# Patient Record
Sex: Female | Born: 1985 | Race: Black or African American | Hispanic: No | Marital: Single | Smoking: Current every day smoker
Health system: Southern US, Community
[De-identification: ages and names within clinical notes are randomized; demographics above are authoritative.]

## PROBLEM LIST (undated history)

## (undated) ENCOUNTER — Ambulatory Visit (HOSPITAL_COMMUNITY): Admission: EM | Payer: BC Managed Care – PPO | Source: Home / Self Care

## (undated) ENCOUNTER — Emergency Department (HOSPITAL_COMMUNITY): Admission: EM | Payer: BC Managed Care – PPO | Source: Home / Self Care

## (undated) DIAGNOSIS — Z789 Other specified health status: Secondary | ICD-10-CM

## (undated) HISTORY — DX: Other specified health status: Z78.9

## (undated) HISTORY — PX: WISDOM TOOTH EXTRACTION: SHX21

---
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
## 2003-11-26 ENCOUNTER — Emergency Department (HOSPITAL_COMMUNITY): Admission: EM | Admit: 2003-11-26 | Discharge: 2003-11-26 | Payer: Self-pay | Admitting: Emergency Medicine

## 2004-10-28 ENCOUNTER — Emergency Department (HOSPITAL_COMMUNITY): Admission: EM | Admit: 2004-10-28 | Discharge: 2004-10-28 | Payer: Self-pay | Admitting: *Deleted

## 2004-12-10 ENCOUNTER — Inpatient Hospital Stay (HOSPITAL_COMMUNITY): Admission: AD | Admit: 2004-12-10 | Discharge: 2004-12-10 | Payer: Self-pay | Admitting: *Deleted

## 2005-04-08 ENCOUNTER — Emergency Department (HOSPITAL_COMMUNITY): Admission: EM | Admit: 2005-04-08 | Discharge: 2005-04-08 | Payer: Self-pay | Admitting: Family Medicine

## 2006-09-12 ENCOUNTER — Emergency Department (HOSPITAL_COMMUNITY): Admission: EM | Admit: 2006-09-12 | Discharge: 2006-09-12 | Payer: Self-pay | Admitting: Emergency Medicine

## 2007-06-18 ENCOUNTER — Emergency Department (HOSPITAL_COMMUNITY): Admission: EM | Admit: 2007-06-18 | Discharge: 2007-06-18 | Payer: Self-pay | Admitting: Emergency Medicine

## 2007-09-23 ENCOUNTER — Emergency Department (HOSPITAL_COMMUNITY): Admission: EM | Admit: 2007-09-23 | Discharge: 2007-09-23 | Payer: Self-pay | Admitting: Emergency Medicine

## 2008-01-26 ENCOUNTER — Emergency Department (HOSPITAL_COMMUNITY): Admission: EM | Admit: 2008-01-26 | Discharge: 2008-01-26 | Payer: Self-pay | Admitting: Family Medicine

## 2008-01-29 ENCOUNTER — Emergency Department (HOSPITAL_COMMUNITY): Admission: EM | Admit: 2008-01-29 | Discharge: 2008-01-29 | Payer: Self-pay | Admitting: Emergency Medicine

## 2008-02-18 ENCOUNTER — Emergency Department (HOSPITAL_COMMUNITY): Admission: EM | Admit: 2008-02-18 | Discharge: 2008-02-19 | Payer: Self-pay | Admitting: Emergency Medicine

## 2008-05-22 ENCOUNTER — Emergency Department (HOSPITAL_COMMUNITY): Admission: EM | Admit: 2008-05-22 | Discharge: 2008-05-22 | Payer: Self-pay | Admitting: Emergency Medicine

## 2009-03-14 ENCOUNTER — Emergency Department (HOSPITAL_COMMUNITY): Admission: EM | Admit: 2009-03-14 | Discharge: 2009-03-15 | Payer: Self-pay | Admitting: Emergency Medicine

## 2009-05-15 ENCOUNTER — Emergency Department (HOSPITAL_COMMUNITY): Admission: EM | Admit: 2009-05-15 | Discharge: 2009-05-15 | Payer: Self-pay | Admitting: Emergency Medicine

## 2010-03-27 LAB — URINALYSIS, ROUTINE W REFLEX MICROSCOPIC
Bilirubin Urine: NEGATIVE
Glucose, UA: NEGATIVE mg/dL
Hgb urine dipstick: NEGATIVE
Ketones, ur: NEGATIVE mg/dL
Nitrite: NEGATIVE
Protein, ur: NEGATIVE mg/dL
Specific Gravity, Urine: 1.026 (ref 1.005–1.030)
Urobilinogen, UA: 4 mg/dL — ABNORMAL HIGH (ref 0.0–1.0)
pH: 7.5 (ref 5.0–8.0)

## 2010-03-27 LAB — CBC
HCT: 36.1 % (ref 36.0–46.0)
Hemoglobin: 12.7 g/dL (ref 12.0–15.0)
MCHC: 35.1 g/dL (ref 30.0–36.0)
MCV: 95.6 fL (ref 78.0–100.0)
Platelets: 159 10*3/uL (ref 150–400)
RBC: 3.77 MIL/uL — ABNORMAL LOW (ref 3.87–5.11)
RDW: 13.1 % (ref 11.5–15.5)
WBC: 8.1 10*3/uL (ref 4.0–10.5)

## 2010-03-27 LAB — COMPREHENSIVE METABOLIC PANEL
ALT: 13 U/L (ref 0–35)
AST: 17 U/L (ref 0–37)
Albumin: 4.1 g/dL (ref 3.5–5.2)
Calcium: 9.3 mg/dL (ref 8.4–10.5)
Chloride: 109 mEq/L (ref 96–112)
Creatinine, Ser: 0.87 mg/dL (ref 0.4–1.2)
GFR calc Af Amer: >60 mL/min (ref >60)
Sodium: 140 mEq/L (ref 135–145)
Total Bilirubin: 1 mg/dL (ref 0.3–1.2)

## 2010-03-27 LAB — DIFFERENTIAL
Basophils Absolute: 0 10*3/uL (ref 0.0–0.1)
Basophils Relative: 0 % (ref 0–1)
Eosinophils Absolute: 0.1 10*3/uL (ref 0.0–0.7)
Eosinophils Relative: 1 % (ref 0–5)
Lymphocytes Relative: 26 % (ref 12–46)
Lymphs Abs: 2.1 10*3/uL (ref 0.7–4.0)
Monocytes Absolute: 0.7 10*3/uL (ref 0.1–1.0)
Monocytes Relative: 8 % (ref 3–12)
Neutro Abs: 5.3 10*3/uL (ref 1.7–7.7)
Neutrophils Relative %: 65 % (ref 43–77)

## 2010-03-27 LAB — POCT PREGNANCY, URINE: Preg Test, Ur: NEGATIVE

## 2010-04-01 LAB — DIFFERENTIAL
Basophils Relative: 0 % (ref 0–1)
Lymphocytes Relative: 25 % (ref 12–46)
Lymphs Abs: 1.1 10*3/uL (ref 0.7–4.0)
Monocytes Relative: 12 % (ref 3–12)
Neutro Abs: 2.7 10*3/uL (ref 1.7–7.7)
Neutrophils Relative %: 63 % (ref 43–77)

## 2010-04-01 LAB — WET PREP, GENITAL: Trich, Wet Prep: NONE SEEN

## 2010-04-01 LAB — URINALYSIS, ROUTINE W REFLEX MICROSCOPIC
Specific Gravity, Urine: 1.029 (ref 1.005–1.030)
Urobilinogen, UA: 2 mg/dL — ABNORMAL HIGH (ref 0.0–1.0)

## 2010-04-01 LAB — COMPREHENSIVE METABOLIC PANEL
BUN: 4 mg/dL — ABNORMAL LOW (ref 6–23)
Calcium: 8.2 mg/dL — ABNORMAL LOW (ref 8.4–10.5)
Creatinine, Ser: 0.9 mg/dL (ref 0.4–1.2)
Glucose, Bld: 84 mg/dL (ref 70–99)
Total Protein: 5.8 g/dL — ABNORMAL LOW (ref 6.0–8.3)

## 2010-04-01 LAB — CBC
HCT: 35.2 % — ABNORMAL LOW (ref 36.0–46.0)
Hemoglobin: 11.8 g/dL — ABNORMAL LOW (ref 12.0–15.0)
MCHC: 33.5 g/dL (ref 30.0–36.0)
RDW: 13 % (ref 11.5–15.5)

## 2010-04-01 LAB — URINE MICROSCOPIC-ADD ON

## 2010-04-17 LAB — URINALYSIS, ROUTINE W REFLEX MICROSCOPIC
Bilirubin Urine: NEGATIVE
Ketones, ur: NEGATIVE mg/dL
Nitrite: NEGATIVE
Protein, ur: NEGATIVE mg/dL
Specific Gravity, Urine: 1.029 (ref 1.005–1.030)
Urobilinogen, UA: 1 mg/dL (ref 0.0–1.0)

## 2010-04-17 LAB — CBC
HCT: 39.5 % (ref 36.0–46.0)
Hemoglobin: 13.6 g/dL (ref 12.0–15.0)
MCHC: 34.3 g/dL (ref 30.0–36.0)
MCV: 95.6 fL (ref 78.0–100.0)
Platelets: 153 K/uL (ref 150–400)
RBC: 4.13 MIL/uL (ref 3.87–5.11)
RDW: 13.3 % (ref 11.5–15.5)
WBC: 7.9 K/uL (ref 4.0–10.5)

## 2010-04-17 LAB — POCT PREGNANCY, URINE: Preg Test, Ur: NEGATIVE

## 2010-04-17 LAB — URINE MICROSCOPIC-ADD ON

## 2010-04-17 LAB — DIFFERENTIAL
Basophils Absolute: 0 10*3/uL (ref 0.0–0.1)
Eosinophils Absolute: 0.1 10*3/uL (ref 0.0–0.7)
Eosinophils Relative: 1 % (ref 0–5)
Monocytes Absolute: 0.7 10*3/uL (ref 0.1–1.0)

## 2010-04-17 LAB — POCT I-STAT, CHEM 8
Calcium, Ion: 1.19 mmol/L (ref 1.12–1.32)
Creatinine, Ser: 1.1 mg/dL (ref 0.4–1.2)
Hemoglobin: 13.9 g/dL (ref 12.0–15.0)
Sodium: 141 mEq/L (ref 135–145)
TCO2: 24 mmol/L (ref 0–100)

## 2010-04-17 LAB — WET PREP, GENITAL
Trich, Wet Prep: NONE SEEN
Yeast Wet Prep HPF POC: NONE SEEN

## 2010-04-17 LAB — URINE CULTURE: Colony Count: 50000

## 2010-04-17 LAB — GC/CHLAMYDIA PROBE AMP, GENITAL
Chlamydia, DNA Probe: NEGATIVE
GC Probe Amp, Genital: NEGATIVE

## 2010-04-23 LAB — DIFFERENTIAL
Basophils Relative: 0 % (ref 0–1)
Eosinophils Absolute: 0 10*3/uL (ref 0.0–0.7)
Eosinophils Absolute: 0 10*3/uL (ref 0.0–0.7)
Eosinophils Relative: 0 % (ref 0–5)
Eosinophils Relative: 0 % (ref 0–5)
Lymphocytes Relative: 6 % — ABNORMAL LOW (ref 12–46)
Lymphs Abs: 0.5 10*3/uL — ABNORMAL LOW (ref 0.7–4.0)
Lymphs Abs: 2.4 10*3/uL (ref 0.7–4.0)
Monocytes Absolute: 0.5 10*3/uL (ref 0.1–1.0)
Monocytes Relative: 4 % (ref 3–12)
Monocytes Relative: 6 % (ref 3–12)
Neutrophils Relative %: 65 % (ref 43–77)

## 2010-04-23 LAB — URINALYSIS, ROUTINE W REFLEX MICROSCOPIC
Bilirubin Urine: NEGATIVE
Ketones, ur: 40 mg/dL — AB
Nitrite: NEGATIVE
Specific Gravity, Urine: 1.027 (ref 1.005–1.030)
Urobilinogen, UA: 0.2 mg/dL (ref 0.0–1.0)
pH: 5.5 (ref 5.0–8.0)

## 2010-04-23 LAB — CBC
HCT: 43.3 % (ref 36.0–46.0)
HCT: 44.8 % (ref 36.0–46.0)
Hemoglobin: 14.4 g/dL (ref 12.0–15.0)
Hemoglobin: 15 g/dL (ref 12.0–15.0)
MCHC: 33.3 g/dL (ref 30.0–36.0)
MCV: 97.5 fL (ref 78.0–100.0)
MCV: 98.8 fL (ref 78.0–100.0)
RBC: 4.38 MIL/uL (ref 3.87–5.11)
RBC: 4.59 MIL/uL (ref 3.87–5.11)
WBC: 8.4 10*3/uL (ref 4.0–10.5)
WBC: 8.8 10*3/uL (ref 4.0–10.5)

## 2010-04-23 LAB — POCT URINALYSIS DIP (DEVICE)
Glucose, UA: NEGATIVE mg/dL
Ketones, ur: 40 mg/dL — AB
Specific Gravity, Urine: >=1.03 (ref 1.005–1.030)
Urobilinogen, UA: 0.2 mg/dL (ref 0.0–1.0)

## 2010-04-23 LAB — POCT I-STAT, CHEM 8
Chloride: 107 mEq/L (ref 96–112)
Glucose, Bld: 80 mg/dL (ref 70–99)
HCT: 46 % (ref 36.0–46.0)
Hemoglobin: 15.6 g/dL — ABNORMAL HIGH (ref 12.0–15.0)
Potassium: 3.8 mEq/L (ref 3.5–5.1)
Sodium: 143 mEq/L (ref 135–145)

## 2010-04-23 LAB — BASIC METABOLIC PANEL
Chloride: 107 mEq/L (ref 96–112)
GFR calc Af Amer: >60 mL/min (ref >60)
GFR calc non Af Amer: >60 mL/min (ref >60)
Potassium: 3.7 mEq/L (ref 3.5–5.1)
Sodium: 137 mEq/L (ref 135–145)

## 2010-04-23 LAB — URINE MICROSCOPIC-ADD ON

## 2010-04-23 LAB — HEPATIC FUNCTION PANEL
ALT: 23 U/L (ref 0–35)
Alkaline Phosphatase: 52 U/L (ref 39–117)
Indirect Bilirubin: 1 mg/dL — ABNORMAL HIGH (ref 0.3–0.9)
Total Bilirubin: 1.3 mg/dL — ABNORMAL HIGH (ref 0.3–1.2)
Total Protein: 8 g/dL (ref 6.0–8.3)

## 2010-04-23 LAB — POCT PREGNANCY, URINE: Preg Test, Ur: NEGATIVE

## 2010-04-23 LAB — LIPASE, BLOOD: Lipase: 25 U/L (ref 11–59)

## 2010-04-24 LAB — URINALYSIS, ROUTINE W REFLEX MICROSCOPIC
Bilirubin Urine: NEGATIVE
Ketones, ur: 15 mg/dL — AB
Nitrite: NEGATIVE
Protein, ur: NEGATIVE mg/dL
Urobilinogen, UA: 1 mg/dL (ref 0.0–1.0)

## 2010-04-24 LAB — POCT I-STAT, CHEM 8
Calcium, Ion: 1.24 mmol/L (ref 1.12–1.32)
Creatinine, Ser: 1.1 mg/dL (ref 0.4–1.2)
Glucose, Bld: 64 mg/dL — ABNORMAL LOW (ref 70–99)
HCT: 42 % (ref 36.0–46.0)
Hemoglobin: 14.3 g/dL (ref 12.0–15.0)
TCO2: 25 mmol/L (ref 0–100)

## 2010-04-24 LAB — COMPREHENSIVE METABOLIC PANEL
ALT: 15 U/L (ref 0–35)
Albumin: 4.4 g/dL (ref 3.5–5.2)
Calcium: 9.4 mg/dL (ref 8.4–10.5)
GFR calc Af Amer: >60 mL/min (ref >60)
Glucose, Bld: 57 mg/dL — ABNORMAL LOW (ref 70–99)
Sodium: 138 mEq/L (ref 135–145)
Total Protein: 7.1 g/dL (ref 6.0–8.3)

## 2010-04-24 LAB — CBC
Hemoglobin: 13.6 g/dL (ref 12.0–15.0)
MCHC: 34.6 g/dL (ref 30.0–36.0)
Platelets: 162 10*3/uL (ref 150–400)
RDW: 12.4 % (ref 11.5–15.5)

## 2010-04-24 LAB — DIFFERENTIAL
Eosinophils Absolute: 0.1 10*3/uL (ref 0.0–0.7)
Lymphs Abs: 3.6 10*3/uL (ref 0.7–4.0)
Monocytes Absolute: 0.6 10*3/uL (ref 0.1–1.0)
Monocytes Relative: 7 % (ref 3–12)
Neutrophils Relative %: 50 % (ref 43–77)

## 2010-05-25 NOTE — Op Note (Signed)
[facility]  Patient:    Dominique Webb, Dominique Webb Visit Number: [id number] MRN: [id number]          Service Type: OUT Location: RAD Attending Physician:  Christa See Dictated by:   Elpidio Anis, M.D. Proc. Date: 08/27/00 Admit Date:  10/07/2000 Discharge Date: 10/07/2000                             Operative Report  DISCHARGE DIAGNOSIS:  Acute gastroenteritis.  DISPOSITION:  Patient discharged home in stable satisfactory condition.  DISCHARGE MEDICATIONS: 1. Phenergan 25 mg q.4h. p.r.n. nausea. 2. Pepcid 20 mg b.i.d. 3. Histussin HC 2 tsp q.4h. p.r.n. cough.  FOLLOWUP:  She is scheduled to be seen in the office on a p.r.n. basis.  BRIEF HISTORY:  A 25 year old female with a history of cough, congestion, recurrent nausea and vomiting without diarrhea.  She had epigastric pain with emesis of green bilious material and brown liquid on the afternoon of admission.  She had not been able to keep down any liquids or solids for greater than 24 hours.  She was admitted with the diagnosis of acute gastroenteritis.  PAST HISTORY:  Unremarkable.  PHYSICAL EXAMINATION:  GENERAL:  She was afebrile.  VITAL SIGNS:  Blood pressure 98/72, pulse 76, respirations 18.  HEENT:  Was unremarkable except for minimal clear nasal exudate.  LUNGS:  Clear.  ABDOMEN:  Benign.  LABORATORY DATA:  White count 5,300, hemoglobin 11.6, hematocrit 33.  Sed rate 4.  BUN 8, creatinine 0.9.  Serum pregnancy test was negative.  HOSPITAL COURSE:  The patient was admitted and started on IV fluids and given antiemetics and monitored closely.  She did very well.  Her cough was treated with Histussin HC antitussive medication.  After 24-hours she was tolerating a liquid and her diet was advanced.  She remained stable.  Her abdomen was benign.  By the evening of August 28, 2000, she was much improved and was tolerating diet well.  Her diet was advanced to a regular diet.  She  remained stable and afebrile.  She tolerated her diet without difficulty and was discharged home on the second hospital day in satisfactory condition. Dictated by:   Elpidio Anis, M.D. Attending Physician:  Christa See DD:  10/25/00 TD:  10/27/00 Job: [id number]

## 2010-10-04 LAB — CBC
Hemoglobin: 12.7
MCHC: 34.5
MCV: 96.2
RBC: 3.82 — ABNORMAL LOW

## 2010-10-04 LAB — DIFFERENTIAL
Basophils Relative: 0
Eosinophils Absolute: 0.1
Monocytes Absolute: 0.7
Monocytes Relative: 8
Neutrophils Relative %: 44

## 2010-10-04 LAB — URINALYSIS, ROUTINE W REFLEX MICROSCOPIC
Bilirubin Urine: NEGATIVE
Protein, ur: NEGATIVE
Urobilinogen, UA: 1

## 2010-10-04 LAB — POCT PREGNANCY, URINE
Operator id: 257131
Preg Test, Ur: NEGATIVE

## 2010-10-04 LAB — URINE MICROSCOPIC-ADD ON

## 2010-10-08 LAB — COMPREHENSIVE METABOLIC PANEL
BUN: 5 — ABNORMAL LOW
CO2: 24
Calcium: 9
Creatinine, Ser: 0.94
GFR calc non Af Amer: >60
Glucose, Bld: 89
Sodium: 138
Total Protein: 6

## 2010-10-08 LAB — WET PREP, GENITAL

## 2010-10-08 LAB — URINE CULTURE

## 2010-10-08 LAB — URINALYSIS, ROUTINE W REFLEX MICROSCOPIC
Protein, ur: NEGATIVE
Urobilinogen, UA: 1

## 2010-10-08 LAB — CBC
Hemoglobin: 12.8
MCHC: 34.4
MCV: 95.9
RBC: 3.87
RDW: 12.9

## 2010-10-08 LAB — GC/CHLAMYDIA PROBE AMP, GENITAL
Chlamydia, DNA Probe: NEGATIVE
GC Probe Amp, Genital: NEGATIVE

## 2010-10-08 LAB — DIFFERENTIAL
Eosinophils Absolute: 0.1
Lymphocytes Relative: 39
Lymphs Abs: 2.9
Monocytes Relative: 8
Neutro Abs: 3.7
Neutrophils Relative %: 51

## 2010-10-08 LAB — URINE MICROSCOPIC-ADD ON

## 2010-10-08 LAB — RPR: RPR Ser Ql: NONREACTIVE

## 2011-03-23 ENCOUNTER — Encounter (HOSPITAL_COMMUNITY): Payer: Self-pay | Admitting: *Deleted

## 2011-03-23 ENCOUNTER — Emergency Department (HOSPITAL_COMMUNITY)
Admission: EM | Admit: 2011-03-23 | Discharge: 2011-03-23 | Disposition: A | Discharge status: Home / Self Care | Payer: Self-pay | Attending: Emergency Medicine | Admitting: Emergency Medicine

## 2011-03-23 DIAGNOSIS — J069 Acute upper respiratory infection, unspecified: Secondary | ICD-10-CM | POA: Insufficient documentation

## 2011-03-23 DIAGNOSIS — Z886 Allergy status to analgesic agent status: Secondary | ICD-10-CM | POA: Insufficient documentation

## 2011-03-23 DIAGNOSIS — F17200 Nicotine dependence, unspecified, uncomplicated: Secondary | ICD-10-CM | POA: Insufficient documentation

## 2011-03-23 MED ORDER — ALBUTEROL SULFATE HFA 108 (90 BASE) MCG/ACT IN AERS
2.0000 | INHALATION_SPRAY | Freq: Once | RESPIRATORY_TRACT | Status: AC
  Administered 2011-03-23: 2 via RESPIRATORY_TRACT
  Filled 2011-03-23: qty 6.7

## 2011-03-23 NOTE — ED Notes (Signed)
MD at bedside. 

## 2011-03-23 NOTE — ED Provider Notes (Signed)
History     CSN: [id number]  Arrival date & time 03/23/11  Dominique Webb   First MD Initiated Contact with Patient 03/23/11 1910      Chief Complaint  Patient presents with  . Nasal Congestion    (Consider location/radiation/quality/duration/timing/severity/associated sxs/prior treatment) HPI History provided by pt.   Pt presents w/ c/o sore throat x 1 week.  Aggravated by swallowing.  Associated w/ chills, nasal congestion and cough productive of yellow sputum.  Has also had 2 episodes of vomiting over the past 24 hours.  Denies sinus/ear pressure, SOB, abd pain, diarrhea and urinary sx.  No known sick contacts.  Pt has no PMH.  She smokes cigarettes.    History reviewed. No pertinent past medical history.  History reviewed. No pertinent past surgical history.  No family history on file.  History  Substance Use Topics  . Smoking status: Current Everyday Smoker -- 0.5 packs/day    Types: Cigarettes  . Smokeless tobacco: Not on file  . Alcohol Use: Yes    OB History    Grav Para Term Preterm Abortions TAB SAB Ect Mult Living   0               Review of Systems  All other systems reviewed and are negative.    Allergies  Aspirin  Home Medications  No current outpatient prescriptions on file.  BP 103/72  Pulse 79  Temp(Src) 98.8 F (37.1 C) (Oral)  Ht 5\' 3"  (1.6 m)  Wt 128 lb (58.06 kg)  BMI 22.67 kg/m2  SpO2 100%  Physical Exam  Nursing note and vitals reviewed. Constitutional: She is oriented to person, place, and time. She appears well-developed and well-nourished. No distress.  HENT:  Head: Normocephalic and atraumatic. No trismus in the jaw.  Right Ear: Tympanic membrane, external ear and ear canal normal.  Left Ear: Tympanic membrane, external ear and ear canal normal.  Mouth/Throat: Uvula is midline and mucous membranes are normal. No oropharyngeal exudate, posterior oropharyngeal edema or posterior oropharyngeal erythema.       Nasal congestion.  No  erythema of posterior pharynx.   Eyes:       Normal appearance  Neck: Normal range of motion. Neck supple.  Cardiovascular: Normal rate and regular rhythm.   Pulmonary/Chest: Effort normal and breath sounds normal.  Lymphadenopathy:    She has no cervical adenopathy.  Neurological: She is alert and oriented to person, place, and time.  Skin: Skin is warm and dry. No rash noted.  Psychiatric: She has a normal mood and affect. Her behavior is normal.    ED Course  Procedures (including critical care time)  Labs Reviewed - No data to display No results found.   1. Viral URI       MDM  Healthy 26yo F presents w/ sore throat, nasal congestion and productive cough x 1 week.  Doubt strep based on centor criteria.  Doubt pneumonia based on VS and presence of other URI sx.  Pt is afebrile and no coughing in ED.  Will treat symptomatically w/ albuterol (recieved inhaler in ED), ibuprofen/tylenol and sudafed.  Return precautions discussed.         Otilio Miu, [place] 03/23/11 2013

## 2011-03-23 NOTE — ED Notes (Signed)
Complaining of nasal and throat congestion x 1 week, coughing up clear phlegm

## 2011-03-23 NOTE — Discharge Instructions (Signed)
Take ibuprofen w/ food up to three times a day, as needed for pain, fever and chills.  If you can not take ibuprofen because of history of stomach bleeding, take tylenol instead. Take sudafed as needed for nasal/sinus congestion.  Use albuterol inhaler, 2 puffs every 4 hours, as needed for cough and shortness of breath.  Get rest, drink plenty of fluids and wash hands often to prevent spread of infection.  You should return to the ER if your cough worsens, you develop shortness of breath or symptoms have not started to improve in 3-4 days.

## 2011-03-24 NOTE — ED Provider Notes (Signed)
Medical screening examination/treatment/procedure(s) were performed by non-physician practitioner and as supervising physician I was immediately available for consultation/collaboration.    Nelia Shi, MD 03/24/11 [phone]

## 2011-05-24 ENCOUNTER — Emergency Department (HOSPITAL_COMMUNITY)
Admission: EM | Admit: 2011-05-24 | Discharge: 2011-05-24 | Disposition: A | Discharge status: Home / Self Care | Payer: Self-pay | Attending: Emergency Medicine | Admitting: Emergency Medicine

## 2011-05-24 DIAGNOSIS — L299 Pruritus, unspecified: Secondary | ICD-10-CM | POA: Insufficient documentation

## 2011-05-24 DIAGNOSIS — L259 Unspecified contact dermatitis, unspecified cause: Secondary | ICD-10-CM | POA: Insufficient documentation

## 2011-05-24 DIAGNOSIS — Z79899 Other long term (current) drug therapy: Secondary | ICD-10-CM | POA: Insufficient documentation

## 2011-05-24 DIAGNOSIS — L309 Dermatitis, unspecified: Secondary | ICD-10-CM

## 2011-05-24 DIAGNOSIS — F17200 Nicotine dependence, unspecified, uncomplicated: Secondary | ICD-10-CM | POA: Insufficient documentation

## 2011-05-24 MED ORDER — HYDROCORTISONE 2.5 % EX CREA: TOPICAL_CREAM | Freq: Two times a day (BID) | CUTANEOUS | Status: DC

## 2011-05-24 NOTE — ED Provider Notes (Signed)
History     CSN: [id number]  Arrival date & time 05/24/11  1021   First MD Initiated Contact with Patient 05/24/11 1049     11:08 AM HPI Patient reports she's had a rash on the Left lateral ankle for 2 months. States it is extremely pruritic. Reports no improvement and appears to be worsening. Reports the area is very dark. Denies redness, drainage, or fever. States she thinks she was bitten by a bug 2 months ago.  Patient is a 26 y.o. female presenting with rash. The history is provided by the patient.  Rash  This is a new problem. Episode onset: 2 months ago. The problem has been gradually worsening. The problem is associated with an unknown factor. There has been no fever. Affected Location: Left ankle. The patient is experiencing no pain. Associated symptoms include itching. Pertinent negatives include no blisters, no pain and no weeping. She has tried nothing for the symptoms.    No past medical history on file.  No past surgical history on file.  No family history on file.  History  Substance Use Topics  . Smoking status: Current Everyday Smoker -- 0.5 packs/day    Types: Cigarettes  . Smokeless tobacco: Not on file  . Alcohol Use: Yes    OB History    Grav Para Term Preterm Abortions TAB SAB Ect Mult Living   0               Review of Systems  Constitutional: Negative for fever and chills.  HENT: Negative for rhinorrhea.   Eyes: Negative for redness.  Respiratory: Negative for cough, shortness of breath and wheezing.   Cardiovascular: Negative for chest pain and palpitations.  Gastrointestinal: Negative for nausea.  Skin: Positive for color change, itching and rash.       Pruritus  All other systems reviewed and are negative.    Allergies  Aspirin  Home Medications   Current Outpatient Rx  Name Route Sig Dispense Refill  . MEDROXYPROGESTERONE ACETATE 150 MG/ML IM SUSP Intramuscular Inject 150 mg into the muscle every 3 (three) months.      BP 103/58   Pulse 53  Temp(Src) 98.5 F (36.9 C) (Oral)  Resp 16  SpO2 100%  Physical Exam  Vitals reviewed. Constitutional: She is oriented to person, place, and time. Vital signs are normal. She appears well-developed and well-nourished. No distress.  HENT:  Head: Normocephalic and atraumatic.  Eyes: Pupils are equal, round, and reactive to light.  Neck: Neck supple.  Pulmonary/Chest: Effort normal.  Neurological: She is alert and oriented to person, place, and time.  Skin: Skin is warm and dry. Rash (patient has a 4 cm skin lesion on left lateral ankle. Skin is lichenified and dry. No erythema, fluctuance, induration, mass.) noted. No erythema. No pallor.  Psychiatric: She has a normal mood and affect. Her behavior is normal.    ED Course  Procedures   MDM   Discussed with patient and mother, the patient appears to have an area of inflammation on her left lateral ankle probably do to severe itching and scratching. Explained to mother that initial cause may have been an insect bite but currently patient just appears to have skin inflammation. Will prescribe patient hydrocortisone cream. Advised moisturizing creams such as Eucerin. Advised to keep covered to prevent more scratching. Advised if no improvement in 2 weeks to followup with dermatologist. I have provided patient with referral for [facility] dermatology. Mother and patient agree on plan and are  ready for discharge.        Thomasene Lot, PA-C 05/24/11 1119

## 2011-05-24 NOTE — ED Notes (Signed)
States woke one morning 2 months ago with itching to left lower leg. Now has large dark area that continues to itch.

## 2011-05-24 NOTE — Discharge Instructions (Signed)
Rash A rash is a change in the color or texture of your skin. There are many different types of rashes. You may have other problems that accompany your rash. CAUSES   Infections.   Allergic reactions. This can include allergies to pets or foods.   Certain medicines.   Exposure to certain chemicals, soaps, or cosmetics.   Heat.   Exposure to poisonous plants.   Tumors, both cancerous and noncancerous.  SYMPTOMS   Redness.   Scaly skin.   Itchy skin.   Dry or cracked skin.   Bumps.   Blisters.   Pain.  DIAGNOSIS  Your caregiver may do a physical exam to determine what type of rash you have.  TREATMENT  Treatment depends on the type of rash you have. Your caregiver may prescribe certain medicines. For serious conditions, you may need to see a skin doctor (dermatologist). HOME CARE INSTRUCTIONS   Avoid the substance that caused your rash.   Do not scratch your rash. This can cause infection.   You may take cool baths to help stop itching.   Only take over-the-counter or prescription medicines as directed by your caregiver.   Keep all follow-up appointments as directed by your caregiver.  SEEK IMMEDIATE MEDICAL CARE IF:  You have increasing pain, swelling, or redness.   You have a fever.   You have new or severe symptoms.   You have body aches, diarrhea, or vomiting.   MAKE SURE YOU:  Understand these instructions.   Will watch your condition.   Will get help right away if you are not doing well or get worse.  Document Released: 12/14/2001 Document Revised: 12/13/2010 Document Reviewed: 10/08/2010 [facility] Patient Information 2012 [place], [place].

## 2011-05-25 NOTE — ED Provider Notes (Signed)
Medical screening examination/treatment/procedure(s) were performed by non-physician practitioner and as supervising physician I was immediately available for consultation/collaboration.  Gracelee Stemmler, MD 05/25/11 0045 

## 2011-08-21 ENCOUNTER — Emergency Department (HOSPITAL_COMMUNITY)
Admission: EM | Admit: 2011-08-21 | Discharge: 2011-08-21 | Disposition: A | Discharge status: Home Health | Payer: Self-pay | Attending: Emergency Medicine | Admitting: Emergency Medicine

## 2011-08-21 ENCOUNTER — Encounter (HOSPITAL_COMMUNITY): Payer: Self-pay | Admitting: Emergency Medicine

## 2011-08-21 DIAGNOSIS — R109 Unspecified abdominal pain: Secondary | ICD-10-CM

## 2011-08-21 DIAGNOSIS — F17200 Nicotine dependence, unspecified, uncomplicated: Secondary | ICD-10-CM | POA: Insufficient documentation

## 2011-08-21 DIAGNOSIS — K2970 Gastritis, unspecified, without bleeding: Secondary | ICD-10-CM | POA: Insufficient documentation

## 2011-08-21 LAB — URINE MICROSCOPIC-ADD ON

## 2011-08-21 LAB — URINALYSIS, ROUTINE W REFLEX MICROSCOPIC
Ketones, ur: 40 mg/dL — AB
Specific Gravity, Urine: 1.026 (ref 1.005–1.030)
pH: 6 (ref 5.0–8.0)

## 2011-08-21 LAB — COMPREHENSIVE METABOLIC PANEL
ALT: 12 U/L (ref 0–35)
AST: 19 U/L (ref 0–37)
Alkaline Phosphatase: 59 U/L (ref 39–117)
CO2: 24 mEq/L (ref 19–32)
Calcium: 9.6 mg/dL (ref 8.4–10.5)
GFR calc non Af Amer: 80 mL/min — ABNORMAL LOW (ref >90)
Glucose, Bld: 87 mg/dL (ref 70–99)
Potassium: 4 mEq/L (ref 3.5–5.1)
Sodium: 138 mEq/L (ref 135–145)

## 2011-08-21 LAB — CBC
Hemoglobin: 14.2 g/dL (ref 12.0–15.0)
MCH: 32.8 pg (ref 26.0–34.0)
Platelets: 171 10*3/uL (ref 150–400)
RBC: 4.33 MIL/uL (ref 3.87–5.11)
WBC: 11.3 10*3/uL — ABNORMAL HIGH (ref 4.0–10.5)

## 2011-08-21 LAB — POCT PREGNANCY, URINE: Preg Test, Ur: NEGATIVE

## 2011-08-21 MED ORDER — PROMETHAZINE HCL 25 MG PO TABS: 25.0000 mg | ORAL_TABLET | Freq: Four times a day (QID) | ORAL | Status: DC | PRN

## 2011-08-21 MED ORDER — MORPHINE SULFATE 4 MG/ML IJ SOLN
4.0000 mg | Freq: Once | INTRAMUSCULAR | Status: AC
  Administered 2011-08-21: 4 mg via INTRAVENOUS
  Filled 2011-08-21: qty 1

## 2011-08-21 MED ORDER — ONDANSETRON HCL 4 MG/2ML IJ SOLN
4.0000 mg | INTRAMUSCULAR | Status: AC
  Administered 2011-08-21: 4 mg via INTRAVENOUS
  Filled 2011-08-21: qty 2

## 2011-08-21 MED ORDER — SODIUM CHLORIDE 0.9 % IV BOLUS (SEPSIS)
1000.0000 mL | INTRAVENOUS | Status: AC
  Administered 2011-08-21: 1000 mL via INTRAVENOUS

## 2011-08-21 NOTE — ED Notes (Signed)
Pt states she had cheeseburger about 1 hour ago now with abd pain n/v.

## 2011-08-21 NOTE — ED Notes (Signed)
Staff asked pt if she was able to void for the U/A sample, pt stated not at this time.

## 2011-08-21 NOTE — ED Notes (Signed)
Pt c/o n/v x 3 hours. Denies diarrhea, pt diaphoretic and actively vomiting in triage, unable to get temp

## 2011-08-21 NOTE — ED Provider Notes (Signed)
History     CSN: [id number]  Arrival date & time 08/21/11  2027   First MD Initiated Contact with Patient 08/21/11 2105      Chief Complaint  Patient presents with  . Nausea  . Emesis  . Abdominal Pain    (Consider location/radiation/quality/duration/timing/severity/associated sxs/prior treatment) HPI Comments: Dominique Webb presents for evaluation of abdominal pain.  She states she prepared herself a burger to eat but experienced cramping abdominal discomfort prior to being able to complete the sandwich.  She then developed sever pain, became sweaty, and vomited 3 times.  The emesis is described as non-bloody and non-bilious.  She denies any fever, diarrhea, constipation, dysuria, vaginal discharges or bleeding, respiratory, or any myalgias or joint pain.  Patient is a 26 y.o. female presenting with vomiting and abdominal pain. The history is provided by the patient. No language interpreter was used.  Emesis  This is a new problem. The current episode started 1 to 2 hours ago (1900 s/p eating a burger). The problem occurs continuously. The problem has not changed since onset.The emesis has an appearance of stomach contents. There has been no fever. Associated symptoms include abdominal pain, chills and sweats. Pertinent negatives include no arthralgias, no cough, no diarrhea, no fever, no headaches, no myalgias and no URI.  Abdominal Pain The primary symptoms of the illness include abdominal pain, nausea and vomiting. The primary symptoms of the illness do not include fever, fatigue, shortness of breath, diarrhea, dysuria, vaginal discharge or vaginal bleeding.  Additional symptoms associated with the illness include chills. Symptoms associated with the illness do not include diaphoresis, constipation, urgency, hematuria, frequency or back pain.    History reviewed. No pertinent past medical history.  History reviewed. No pertinent past surgical history.  No family history on  file.  History  Substance Use Topics  . Smoking status: Current Everyday Smoker -- 0.5 packs/day    Types: Cigarettes  . Smokeless tobacco: Not on file  . Alcohol Use: Yes    OB History    Grav Para Term Preterm Abortions TAB SAB Ect Mult Living   0               Review of Systems  Constitutional: Positive for chills and appetite change. Negative for fever, diaphoresis, activity change and fatigue.  HENT: Negative.   Eyes: Negative.   Respiratory: Negative for cough, choking, chest tightness, shortness of breath and wheezing.   Cardiovascular: Negative for chest pain, palpitations and leg swelling.  Gastrointestinal: Positive for nausea, vomiting and abdominal pain. Negative for diarrhea, constipation, blood in stool, abdominal distention and anal bleeding.  Genitourinary: Negative for dysuria, urgency, frequency, hematuria, flank pain, vaginal bleeding and vaginal discharge.       Had depot provera shot 1 week ago.  Experienced mild spotting just prior to the shot.  Musculoskeletal: Negative for myalgias, back pain, joint swelling and arthralgias.  Skin: Negative.   Neurological: Positive for weakness and light-headedness. Negative for dizziness, tremors, seizures, syncope and headaches.  Psychiatric/Behavioral: Negative.     Allergies  Aspirin  Home Medications   Current Outpatient Rx  Name Route Sig Dispense Refill  . HYDROCORTISONE 2.5 % EX CREA Topical Apply topically 2 (two) times daily. 30 g 0  . MEDROXYPROGESTERONE ACETATE 150 MG/ML IM SUSP Intramuscular Inject 150 mg into the muscle every 3 (three) months.      BP 110/64  Pulse 71  Resp 14  Ht 5\' 3"  (1.6 m)  Wt 126 lb (57.153 kg)  BMI 22.32 kg/m2  SpO2 100%  Physical Exam  Constitutional: She is oriented to person, place, and time. She appears well-developed and well-nourished. No distress.  HENT:  Head: Normocephalic and atraumatic.  Right Ear: External ear normal.  Left Ear: External ear normal.   Nose: Nose normal.  Mouth/Throat: Oropharynx is clear and moist. No oropharyngeal exudate.  Eyes: Conjunctivae and EOM are normal. Pupils are equal, round, and reactive to light. Right eye exhibits no discharge. Left eye exhibits no discharge. No scleral icterus.  Neck: Normal range of motion. Neck supple. No JVD present. No tracheal deviation present. No thyromegaly present.  Cardiovascular: Normal rate, regular rhythm, normal heart sounds and intact distal pulses.  Exam reveals no gallop and no friction rub.   No murmur heard. Pulmonary/Chest: Effort normal and breath sounds normal. No stridor. No respiratory distress. She has no wheezes. She has no rales. She exhibits no tenderness.  Abdominal: Soft. Bowel sounds are normal. She exhibits no distension and no mass. There is no hepatosplenomegaly, splenomegaly or hepatomegaly. There is tenderness. There is no rebound, no guarding and no CVA tenderness.       Generalized right-sided pain.  Musculoskeletal: Normal range of motion. She exhibits no edema and no tenderness.  Lymphadenopathy:    She has no cervical adenopathy.  Neurological: She is alert and oriented to person, place, and time. No cranial nerve deficit. Coordination normal.  Skin: Skin is warm and dry. No rash noted. She is not diaphoretic. No erythema. No pallor.  Psychiatric: She has a normal mood and affect. Her behavior is normal. Judgment and thought content normal.    ED Course  Procedures (including critical care time)   Labs Reviewed  CBC  COMPREHENSIVE METABOLIC PANEL  URINALYSIS, ROUTINE W REFLEX MICROSCOPIC  LIPASE, BLOOD   No results found.   No diagnosis found.    MDM  Pt presents for evaluation of abdominal pain associated with nausea and vomiting.  She reports continued nausea and discomfort and has reproducible right-sided abdominal pain that is neither RUQ or RLQ pain.  Plan symptomatic care, serial exams, and routine belly labs.  Repeat exam  performed (2300).  Pt's pain has improved.  She now only has mild epigastric discomfort.  There is no reproducible lower abdominal tenderness.  Will review U/A as available.  Anticipate pt will be discharged home.  Pt remains stable.  There is no evidence of a urinary tract infection on exam.  Will discharge home with symptomatic care.  She has been instructed to return to the emergency department if she experiences similar or worsening abdominal pain.        Tobin Chad, MD 08/21/11 2340

## 2012-01-14 ENCOUNTER — Encounter (HOSPITAL_COMMUNITY): Payer: Self-pay | Admitting: Emergency Medicine

## 2012-01-14 ENCOUNTER — Emergency Department (HOSPITAL_COMMUNITY)
Admission: EM | Admit: 2012-01-14 | Discharge: 2012-01-14 | Disposition: A | Discharge status: Home / Self Care | Payer: Self-pay | Attending: Emergency Medicine | Admitting: Emergency Medicine

## 2012-01-14 DIAGNOSIS — R51 Headache: Secondary | ICD-10-CM | POA: Insufficient documentation

## 2012-01-14 DIAGNOSIS — K089 Disorder of teeth and supporting structures, unspecified: Secondary | ICD-10-CM | POA: Insufficient documentation

## 2012-01-14 DIAGNOSIS — R630 Anorexia: Secondary | ICD-10-CM | POA: Insufficient documentation

## 2012-01-14 DIAGNOSIS — F17200 Nicotine dependence, unspecified, uncomplicated: Secondary | ICD-10-CM | POA: Insufficient documentation

## 2012-01-14 DIAGNOSIS — K0889 Other specified disorders of teeth and supporting structures: Secondary | ICD-10-CM

## 2012-01-14 DIAGNOSIS — M2560 Stiffness of unspecified joint, not elsewhere classified: Secondary | ICD-10-CM | POA: Insufficient documentation

## 2012-01-14 MED ORDER — ONDANSETRON 4 MG PO TBDP
4.0000 mg | ORAL_TABLET | Freq: Once | ORAL | Status: AC
  Administered 2012-01-14: 4 mg via ORAL
  Filled 2012-01-14: qty 1

## 2012-01-14 MED ORDER — OXYCODONE-ACETAMINOPHEN 5-325 MG PO TABS
2.0000 | ORAL_TABLET | Freq: Once | ORAL | Status: AC
  Administered 2012-01-14: 2 via ORAL
  Filled 2012-01-14: qty 2

## 2012-01-14 MED ORDER — PENICILLIN V POTASSIUM 500 MG PO TABS: 500.0000 mg | ORAL_TABLET | Freq: Four times a day (QID) | ORAL | Status: DC

## 2012-01-14 MED ORDER — OXYCODONE-ACETAMINOPHEN 5-325 MG PO TABS: 1.0000 | ORAL_TABLET | ORAL | Status: DC | PRN

## 2012-01-14 MED ORDER — PENICILLIN V POTASSIUM 500 MG PO TABS: 500.0000 mg | ORAL_TABLET | Freq: Four times a day (QID) | ORAL | Status: AC

## 2012-01-14 NOTE — ED Notes (Signed)
Pt reports persistent pain - .bilat, wisdom teeth in lower jaw x 2 weeks

## 2012-01-14 NOTE — Progress Notes (Signed)
Pt listed with no insurance coverage CM and [facility] liaison spoke with pt.  Pt offered services to assist with finding a [place] self pay provider & health reform information 

## 2012-01-16 NOTE — ED Provider Notes (Signed)
History     CSN: [id number]  Arrival date & time 01/14/12  1308   First MD Initiated Contact with Patient 01/14/12 [phone]      Chief Complaint  Patient presents with  . Dental Pain    hx pain in wisdom teeth    (Consider location/radiation/quality/duration/timing/severity/associated sxs/prior treatment) Patient is a 27 y.o. female presenting with tooth pain. The history is provided by the patient and medical records. No language interpreter was used.  Dental PainThe primary symptoms include mouth pain and headaches. Primary symptoms do not include dental injury, oral bleeding, oral lesions, fever, shortness of breath, sore throat, angioedema or cough. Episode onset: 2 weeks. The symptoms are worsening. The symptoms are recurrent. The symptoms occur constantly.  The headache is not associated with neck stiffness.  Additional symptoms include: gum swelling, gum tenderness, trismus, jaw pain and ear pain. Additional symptoms do not include: dental sensitivity to temperature, purulent gums, facial swelling, trouble swallowing, pain with swallowing, excessive salivation, dry mouth, taste disturbance, smell disturbance, drooling, hearing loss, nosebleeds, swollen glands, goiter and fatigue. Associated symptoms comments: Decreased oral intake due to sever pain.. Medical issues include: smoking.  Patient with c/o wisdom tooth pain worsening over the past 2 weeks.  No difficulty breathing or swallowing. Denies fevers, chills, myalgias, arthralgias   History reviewed. No pertinent past medical history.  History reviewed. No pertinent past surgical history.  History reviewed. No pertinent family history.  History  Substance Use Topics  . Smoking status: Current Every Day Smoker -- 0.5 packs/day    Types: Cigarettes  . Smokeless tobacco: Not on file  . Alcohol Use: Yes    OB History    Grav Para Term Preterm Abortions TAB SAB Ect Mult Living   0               Review of Systems    Constitutional: Positive for appetite change. Negative for fever and fatigue.  HENT: Positive for ear pain and dental problem. Negative for hearing loss, nosebleeds, congestion, sore throat, facial swelling, rhinorrhea, drooling, mouth sores, trouble swallowing, neck pain, neck stiffness, postnasal drip, tinnitus and ear discharge.   Eyes: Negative.   Respiratory: Negative for cough and shortness of breath.   Gastrointestinal: Negative.   Genitourinary: Negative.   Musculoskeletal: Negative.   Skin: Negative.   Neurological: Positive for headaches.  Hematological: Negative.   Psychiatric/Behavioral: Negative.     Allergies  Aspirin  Home Medications   Current Outpatient Rx  Name  Route  Sig  Dispense  Refill  . MEDROXYPROGESTERONE ACETATE 150 MG/ML IM SUSP   Intramuscular   Inject 150 mg into the muscle every 3 (three) months.         . OXYCODONE-ACETAMINOPHEN 5-325 MG PO TABS   Oral   Take 1-2 tablets by mouth every 4 (four) hours as needed for pain.   20 tablet   0   . PENICILLIN V POTASSIUM 500 MG PO TABS   Oral   Take 1 tablet (500 mg total) by mouth 4 (four) times daily.   40 tablet   0     BP 107/69  Pulse 56  Temp 98.3 F (36.8 C) (Oral)  Resp 16  SpO2 100%  Physical Exam  Nursing note and vitals reviewed. Constitutional: She is oriented to person, place, and time. She appears well-developed and well-nourished.       Patient appears in acute pain  HENT:  Head: Normocephalic and atraumatic.  Mouth/Throat: Uvula is midline and oropharynx  is clear and moist.    Eyes: Conjunctivae normal are normal. No scleral icterus.  Neck: Normal range of motion.  Cardiovascular: Normal rate, regular rhythm and normal heart sounds.  Exam reveals no gallop and no friction rub.   No murmur heard. Pulmonary/Chest: Effort normal and breath sounds normal. No respiratory distress.  Abdominal: Soft. Bowel sounds are normal. She exhibits no distension and no mass. There is  no tenderness. There is no guarding.  Neurological: She is alert and oriented to person, place, and time.  Skin: Skin is warm and dry. She is not diaphoretic.    ED Course  Procedures (including critical care time)  Labs Reviewed - No data to display No results found.   1. Pain, dental       MDM  Patient with wisdom tooth pain.  No gross abscess.  Exam unconcerning for Ludwig's angina or spread of infection.  Will treat with penicillin and pain medicine.  Explicit directions for dental follow up given.Discussed reasons to seek immediate care. Patient expresses understanding and agrees with plan.         Arthor Captain, PA-C 01/16/12 2011

## 2012-01-18 NOTE — ED Provider Notes (Signed)
Medical screening examination/treatment/procedure(s) were performed by non-physician practitioner and as supervising physician I was immediately available for consultation/collaboration. Kole Hilyard, MD, FACEP   Aishah Teffeteller L Jontavious Commons, MD 01/18/12 0706 

## 2012-03-21 ENCOUNTER — Emergency Department (HOSPITAL_COMMUNITY)
Admission: EM | Admit: 2012-03-21 | Discharge: 2012-03-21 | Disposition: A | Discharge status: Home / Self Care | Payer: PRIVATE HEALTH INSURANCE | Attending: Emergency Medicine | Admitting: Emergency Medicine

## 2012-03-21 ENCOUNTER — Encounter (HOSPITAL_COMMUNITY): Payer: Self-pay | Admitting: Emergency Medicine

## 2012-03-21 DIAGNOSIS — Z79899 Other long term (current) drug therapy: Secondary | ICD-10-CM | POA: Insufficient documentation

## 2012-03-21 DIAGNOSIS — H6120 Impacted cerumen, unspecified ear: Secondary | ICD-10-CM | POA: Insufficient documentation

## 2012-03-21 DIAGNOSIS — H9190 Unspecified hearing loss, unspecified ear: Secondary | ICD-10-CM | POA: Insufficient documentation

## 2012-03-21 DIAGNOSIS — H6121 Impacted cerumen, right ear: Secondary | ICD-10-CM

## 2012-03-21 DIAGNOSIS — F17200 Nicotine dependence, unspecified, uncomplicated: Secondary | ICD-10-CM | POA: Insufficient documentation

## 2012-03-21 MED ORDER — DOCUSATE SODIUM 100 MG PO CAPS
100.0000 mg | ORAL_CAPSULE | Freq: Once | ORAL | Status: AC
  Administered 2012-03-21: 100 mg via ORAL
  Filled 2012-03-21: qty 1

## 2012-03-21 NOTE — ED Notes (Signed)
Irrigated ear with warm water.  Wax removed from outer channel.

## 2012-03-21 NOTE — ED Notes (Signed)
Pt presents w/ right ear pain x 1 week. Denies other sx or drainage.

## 2012-03-21 NOTE — ED Provider Notes (Signed)
History    This chart was scribed for non-physician practitioner working with Toy Baker, MD by Frederik Pear, ED Scribe. This patient was seen in room WTR8/WTR8 and the patient's care was started at 1614.   CSN: [id number]  Arrival date & time 03/21/12  1550   First MD Initiated Contact with Patient 03/21/12 1614      Chief Complaint  Patient presents with  . Otalgia    (Consider location/radiation/quality/duration/timing/severity/associated sxs/prior treatment) Patient is a 27 y.o. female presenting with ear pain. The history is provided by the patient. No language interpreter was used.  Otalgia Location:  Right Behind ear:  No abnormality Quality:  Aching and throbbing Onset quality:  Sudden Duration:  1 week Timing:  Constant Progression:  Unchanged Chronicity:  New Ineffective treatments:  None tried Associated symptoms: hearing loss   Associated symptoms: no congestion, no cough, no ear discharge and no fever     Dominique Webb is a 27 y.o. female who presents to the Emergency Department complaining of sudden onset, constant, non-radiating, 8/10, achy, throbbing right otalgia with intermittent loss of hearing that began 1 week ago.  She denies any trauma to the area, discharge, congestion, cough, or sinus pressure.  History reviewed. No pertinent past medical history.  History reviewed. No pertinent past surgical history.  No family history on file.  History  Substance Use Topics  . Smoking status: Current Every Day Smoker -- 0.50 packs/day    Types: Cigarettes  . Smokeless tobacco: Never Used  . Alcohol Use: Yes     Comment: Vodka ocassionally    OB History   Grav Para Term Preterm Abortions TAB SAB Ect Mult Living   0               Review of Systems  Constitutional: Negative for fever and chills.  HENT: Positive for hearing loss and ear pain. Negative for congestion, sinus pressure and ear discharge.   Respiratory: Negative for cough.   All  other systems reviewed and are negative.    Allergies  Aspirin  Home Medications   Current Outpatient Rx  Name  Route  Sig  Dispense  Refill  . medroxyPROGESTERone (DEPO-PROVERA) 150 MG/ML injection   Intramuscular   Inject 150 mg into the muscle every 3 (three) months.         Marland Kitchen oxyCODONE-acetaminophen (PERCOCET) 5-325 MG per tablet   Oral   Take 1-2 tablets by mouth every 4 (four) hours as needed for pain.   20 tablet   0     BP 103/56  Pulse 74  Temp(Src) 98.7 F (37.1 C) (Oral)  Resp 14  SpO2 99%  Physical Exam  Nursing note and vitals reviewed. Constitutional: She appears well-developed and well-nourished.  HENT:  Head: Normocephalic and atraumatic.  Right Ear: External ear normal.  Left Ear: Hearing, tympanic membrane, external ear and ear canal normal.  Nose: Nose normal.  Mouth/Throat: Oropharynx is clear and moist.  Right TM is not visible due to cerumen impaction. No mastoid tenderness  Eyes: EOM are normal. Pupils are equal, round, and reactive to light.  Neck: Normal range of motion. Neck supple.  Cardiovascular: Normal rate, regular rhythm and normal heart sounds.   No murmur heard. Pulmonary/Chest: Effort normal and breath sounds normal. No respiratory distress. She has no wheezes.  Musculoskeletal: Normal range of motion.  Neurological: She is alert.  Skin: Skin is warm and dry.  Psychiatric: She has a normal mood and affect. Her behavior  is normal.    ED Course  Procedures (including critical care time)  DIAGNOSTIC STUDIES: Oxygen Saturation is 99% on room air, normal by my interpretation.    COORDINATION OF CARE:  16:39- Discussed planned course of treatment with the patient, including irrigating the right ear, who is agreeable at this time.  19:13- Recheck- The right TM is now visible, but there is still a large amount of cerumen present, Discussed using OTC cerumen removal drops to dislodge the rest of the wax.  Labs Reviewed - No  data to display No results found.   No diagnosis found.    MDM  Patient presenting with right ear pain.  Cerumen impaction of the right ear.  Patient afebrile.  No signs of ear infection.  No mastoid tenderness.  Ear irrigated while in the ED.    I personally performed the services described in this documentation, which was scribed in my presence. The recorded information has been reviewed and is accurate.        Pascal Lux [place], PA-C 03/22/12 1349

## 2012-03-23 NOTE — ED Provider Notes (Signed)
Medical screening examination/treatment/procedure(s) were conducted as a shared visit with non-physician practitioner(s) and myself.  I personally evaluated the patient during the encounter  Janely Gullickson T Tarrie Mcmichen, MD 03/23/12 1437 

## 2012-09-22 ENCOUNTER — Emergency Department (HOSPITAL_COMMUNITY)
Admission: EM | Admit: 2012-09-22 | Discharge: 2012-09-22 | Disposition: A | Discharge status: Home / Self Care | Payer: PRIVATE HEALTH INSURANCE | Attending: Emergency Medicine | Admitting: Emergency Medicine

## 2012-09-22 ENCOUNTER — Encounter (HOSPITAL_COMMUNITY): Payer: Self-pay | Admitting: Emergency Medicine

## 2012-09-22 DIAGNOSIS — R112 Nausea with vomiting, unspecified: Secondary | ICD-10-CM

## 2012-09-22 DIAGNOSIS — F17200 Nicotine dependence, unspecified, uncomplicated: Secondary | ICD-10-CM | POA: Insufficient documentation

## 2012-09-22 DIAGNOSIS — R197 Diarrhea, unspecified: Secondary | ICD-10-CM | POA: Insufficient documentation

## 2012-09-22 DIAGNOSIS — R350 Frequency of micturition: Secondary | ICD-10-CM | POA: Insufficient documentation

## 2012-09-22 DIAGNOSIS — Z3202 Encounter for pregnancy test, result negative: Secondary | ICD-10-CM | POA: Insufficient documentation

## 2012-09-22 LAB — COMPREHENSIVE METABOLIC PANEL
AST: 18 U/L (ref 0–37)
Albumin: 4.3 g/dL (ref 3.5–5.2)
Calcium: 9.5 mg/dL (ref 8.4–10.5)
Creatinine, Ser: 0.83 mg/dL (ref 0.50–1.10)
Sodium: 138 mEq/L (ref 135–145)
Total Protein: 7.3 g/dL (ref 6.0–8.3)

## 2012-09-22 LAB — CBC WITH DIFFERENTIAL/PLATELET
Basophils Absolute: 0 10*3/uL (ref 0.0–0.1)
Basophils Relative: 0 % (ref 0–1)
Eosinophils Relative: 1 % (ref 0–5)
HCT: 39.2 % (ref 36.0–46.0)
MCHC: 33.9 g/dL (ref 30.0–36.0)
MCV: 94.9 fL (ref 78.0–100.0)
Monocytes Absolute: 0.5 10*3/uL (ref 0.1–1.0)
Neutro Abs: 4.1 10*3/uL (ref 1.7–7.7)
Platelets: 175 10*3/uL (ref 150–400)
RDW: 12.2 % (ref 11.5–15.5)

## 2012-09-22 LAB — URINALYSIS W MICROSCOPIC + REFLEX CULTURE
Bilirubin Urine: NEGATIVE
Ketones, ur: NEGATIVE mg/dL
Protein, ur: NEGATIVE mg/dL
Urobilinogen, UA: 1 mg/dL (ref 0.0–1.0)

## 2012-09-22 LAB — POCT PREGNANCY, URINE: Preg Test, Ur: NEGATIVE

## 2012-09-22 MED ORDER — ONDANSETRON HCL 4 MG/2ML IJ SOLN
4.0000 mg | Freq: Once | INTRAMUSCULAR | Status: AC
  Administered 2012-09-22: 4 mg via INTRAVENOUS
  Filled 2012-09-22: qty 2

## 2012-09-22 MED ORDER — DICYCLOMINE HCL 20 MG PO TABS: 20.0000 mg | ORAL_TABLET | Freq: Two times a day (BID) | ORAL | Status: DC

## 2012-09-22 MED ORDER — PROMETHAZINE HCL 12.5 MG PO TABS: 12.5000 mg | ORAL_TABLET | Freq: Four times a day (QID) | ORAL | Status: DC | PRN

## 2012-09-22 MED ORDER — SODIUM CHLORIDE 0.9 % IV BOLUS (SEPSIS)
1000.0000 mL | Freq: Once | INTRAVENOUS | Status: AC
  Administered 2012-09-22: 1000 mL via INTRAVENOUS

## 2012-09-22 MED ORDER — DICYCLOMINE HCL 10 MG PO CAPS
10.0000 mg | ORAL_CAPSULE | Freq: Once | ORAL | Status: AC
  Administered 2012-09-22: 10 mg via ORAL
  Filled 2012-09-22: qty 1

## 2012-09-22 NOTE — ED Notes (Signed)
Patient reports nausea, vomiting, diarrhea since Saturday.  Also reports mid to lower abdominal pain.  Patients symptoms have been intermittent.  No vaginal discharge or reports of fever.

## 2012-09-22 NOTE — ED Notes (Signed)
Bed: WA21 Expected date:  Expected time:  Means of arrival:  Comments: 

## 2012-09-22 NOTE — ED Provider Notes (Signed)
CSN: [id number]     Arrival date & time 09/22/12  1545 History   First MD Initiated Contact with Patient 09/22/12 1604     Chief Complaint  Patient presents with  . Abdominal Pain  . Emesis  . Urinary Frequency   (Consider location/radiation/quality/duration/timing/severity/associated sxs/prior Treatment) HPI Dominique Webb is a 27 y.o. female who presents to emergency department with complaint of nausea, vomiting, abdominal pain, urinary frequency. Patient states that she has been sick for 3 days. States her niece is sick with the same symptoms. States that her symptoms began on Saturday, states had nausea vomiting and diarrhea all day. Patient states she felt better he Sunday. States that yesterday when she went to work her symptoms return. States she is able to keep some food and fluids down however states vomits intermittently. Patient continues to have watery diarrhea, states 2 stools today. Patient denies any blood in her emesis or in her stool. States abdominal pain is periumbilical. Pain does not radiate. She did not take any medications for this problem. Patient states that she is having urinary frequency however denies any dysuria, hematuria, urinary urgency. Patient denies any vaginal discharge or bleeding. History reviewed. No pertinent past medical history. History reviewed. No pertinent past surgical history. No family history on file. History  Substance Use Topics  . Smoking status: Current Every Day Smoker -- 0.50 packs/day    Types: Cigarettes  . Smokeless tobacco: Never Used  . Alcohol Use: Yes     Comment: Vodka ocassionally   OB History   Grav Para Term Preterm Abortions TAB SAB Ect Mult Living   0              Review of Systems  Constitutional: Negative for fever and chills.  HENT: Negative for neck pain and neck stiffness.   Respiratory: Negative for cough, chest tightness and shortness of breath.   Cardiovascular: Negative for chest pain, palpitations and leg  swelling.  Gastrointestinal: Positive for nausea, vomiting, abdominal pain and diarrhea. Negative for constipation, blood in stool and rectal pain.  Genitourinary: Positive for frequency. Negative for dysuria, urgency, flank pain, vaginal bleeding, vaginal discharge, vaginal pain and pelvic pain.  Musculoskeletal: Negative for myalgias and arthralgias.  Skin: Negative for rash.  Neurological: Negative for dizziness, weakness and headaches.  All other systems reviewed and are negative.    Allergies  Aspirin  Home Medications   Current Outpatient Rx  Name  Route  Sig  Dispense  Refill  . medroxyPROGESTERone (DEPO-PROVERA) 150 MG/ML injection   Intramuscular   Inject 150 mg into the muscle every 3 (three) months.          There were no vitals taken for this visit. Physical Exam  Nursing note and vitals reviewed. Constitutional: She appears well-developed and well-nourished. No distress.  HENT:  Head: Normocephalic.  Eyes: Conjunctivae are normal.  Neck: Neck supple.  Cardiovascular: Normal rate, regular rhythm and normal heart sounds.   Pulmonary/Chest: Effort normal and breath sounds normal. No respiratory distress. She has no wheezes. She has no rales.  Abdominal: Soft. Bowel sounds are normal. She exhibits no distension. There is tenderness. There is no rebound and no guarding.  Tenderness over umbilicus. No CVA tenderness  Musculoskeletal: She exhibits no edema.  Neurological: She is alert.  Skin: Skin is warm and dry.  Psychiatric: She has a normal mood and affect. Her behavior is normal.    ED Course  Procedures (including critical care time) Results for orders placed during  the hospital encounter of 09/22/12  URINALYSIS W MICROSCOPIC + REFLEX CULTURE      Result Value Range   Color, Urine YELLOW  YELLOW   APPearance CLEAR  CLEAR   Specific Gravity, Urine 1.024  1.005 - 1.030   pH 6.0  5.0 - 8.0   Glucose, UA NEGATIVE  NEGATIVE mg/dL   Hgb urine dipstick TRACE  (*) NEGATIVE   Bilirubin Urine NEGATIVE  NEGATIVE   Ketones, ur NEGATIVE  NEGATIVE mg/dL   Protein, ur NEGATIVE  NEGATIVE mg/dL   Urobilinogen, UA 1.0  0.0 - 1.0 mg/dL   Nitrite NEGATIVE  NEGATIVE   Leukocytes, UA NEGATIVE  NEGATIVE   RBC / HPF 3-6  <3 RBC/hpf   Squamous Epithelial / LPF FEW (*) RARE   Urine-Other MUCOUS PRESENT    CBC WITH DIFFERENTIAL      Result Value Range   WBC 7.6  4.0 - 10.5 K/uL   RBC 4.13  3.87 - 5.11 MIL/uL   Hemoglobin 13.3  12.0 - 15.0 g/dL   HCT 14.7  82.9 - 56.2 %   MCV 94.9  78.0 - 100.0 fL   MCH 32.2  26.0 - 34.0 pg   MCHC 33.9  30.0 - 36.0 g/dL   RDW 13.0  86.5 - 78.4 %   Platelets 175  150 - 400 K/uL   Neutrophils Relative % 54  43 - 77 %   Neutro Abs 4.1  1.7 - 7.7 K/uL   Lymphocytes Relative 39  12 - 46 %   Lymphs Abs 3.0  0.7 - 4.0 K/uL   Monocytes Relative 7  3 - 12 %   Monocytes Absolute 0.5  0.1 - 1.0 K/uL   Eosinophils Relative 1  0 - 5 %   Eosinophils Absolute 0.1  0.0 - 0.7 K/uL   Basophils Relative 0  0 - 1 %   Basophils Absolute 0.0  0.0 - 0.1 K/uL  COMPREHENSIVE METABOLIC PANEL      Result Value Range   Sodium 138  135 - 145 mEq/L   Potassium 4.1  3.5 - 5.1 mEq/L   Chloride 104  96 - 112 mEq/L   CO2 24  19 - 32 mEq/L   Glucose, Bld 85  70 - 99 mg/dL   BUN 7  6 - 23 mg/dL   Creatinine, Ser 6.96  0.50 - 1.10 mg/dL   Calcium 9.5  8.4 - 29.5 mg/dL   Total Protein 7.3  6.0 - 8.3 g/dL   Albumin 4.3  3.5 - 5.2 g/dL   AST 18  0 - 37 U/L   ALT 10  0 - 35 U/L   Alkaline Phosphatase 50  39 - 117 U/L   Total Bilirubin 0.3  0.3 - 1.2 mg/dL   GFR calc non Af Amer >90  >90 mL/min   GFR calc Af Amer >90  >90 mL/min  LIPASE, BLOOD      Result Value Range   Lipase 36  11 - 59 U/L  POCT PREGNANCY, URINE      Result Value Range   Preg Test, Ur NEGATIVE  NEGATIVE   No results found.  6:51 PM Pt reassessed after receiving zofran and bentyl. She is not feeling better, however not vomiting or having any nausea at this time. Her  bloodwork is normal here.   MDM   1. Nausea vomiting and diarrhea      Patient in emergency department with abdominal pain, nausea, vomiting, diarrhea, urinary frequency.  Patient was exposed to a family member with same symptoms. On exam she does have periumbilical tenderness however no guarding or rebound tenderness. Her lab work in emergency Department normal. She was treated with Zofran, IV fluids, Bentyl. Patient did not feel much better from pain standpoint however she states her nausea improved. She not vomiting in ER. She is tolerating by mouth fluids. Have explained to her that it is possible still that she may have an early appendicitis or other intra-abdominal process however this time she has no signs of that. Patient also is asking to be discharged home. I will discharge her home with Zofran, and Bentyl for pain. Followup with primary care doctor outpatient. She is to return if symptoms are worsening. Will give a work note for today and tomorrow.  Filed Vitals:   09/22/12 1919  BP: 115/59  Pulse: 55  Resp: 16  SpO2: 100%       Noeli Lavery A Yehuda Printup, PA-C 09/23/12 0112

## 2012-09-22 NOTE — ED Notes (Signed)
Pt states since Saturday she has been having abd pain al;ong with n/v and urinary frequency was around here niece that was sick prior to feeling like this.

## 2012-09-22 NOTE — Progress Notes (Signed)
Patient confirms that she does not have a pcp.  Instructed patient to call phone number on the back of her insurance card to help her find a physician who is close to her and within network.  Patient verbalized understanding.  No further needs at this time.

## 2012-09-23 NOTE — ED Provider Notes (Signed)
Medical screening examination/treatment/procedure(s) were performed by non-physician practitioner and as supervising physician I was immediately available for consultation/collaboration.   Zuleima Haser S Deidre Carino, MD 09/23/12 1153 

## 2013-03-31 ENCOUNTER — Encounter (HOSPITAL_COMMUNITY): Payer: Self-pay | Admitting: Emergency Medicine

## 2013-03-31 ENCOUNTER — Emergency Department (HOSPITAL_COMMUNITY)
Admission: EM | Admit: 2013-03-31 | Discharge: 2013-03-31 | Disposition: A | Discharge status: Home / Self Care | Payer: PRIVATE HEALTH INSURANCE | Attending: Emergency Medicine | Admitting: Emergency Medicine

## 2013-03-31 DIAGNOSIS — Z79899 Other long term (current) drug therapy: Secondary | ICD-10-CM | POA: Insufficient documentation

## 2013-03-31 DIAGNOSIS — F17200 Nicotine dependence, unspecified, uncomplicated: Secondary | ICD-10-CM | POA: Insufficient documentation

## 2013-03-31 DIAGNOSIS — R55 Syncope and collapse: Secondary | ICD-10-CM | POA: Insufficient documentation

## 2013-03-31 DIAGNOSIS — K2970 Gastritis, unspecified, without bleeding: Secondary | ICD-10-CM | POA: Insufficient documentation

## 2013-03-31 DIAGNOSIS — Z3202 Encounter for pregnancy test, result negative: Secondary | ICD-10-CM | POA: Insufficient documentation

## 2013-03-31 DIAGNOSIS — K2990 Gastroduodenitis, unspecified, without bleeding: Principal | ICD-10-CM

## 2013-03-31 LAB — CBC WITH DIFFERENTIAL/PLATELET
BASOS PCT: 0 % (ref 0–1)
Basophils Absolute: 0 10*3/uL (ref 0.0–0.1)
EOS ABS: 0.1 10*3/uL (ref 0.0–0.7)
EOS PCT: 2 % (ref 0–5)
HCT: 39.9 % (ref 36.0–46.0)
Hemoglobin: 13.3 g/dL (ref 12.0–15.0)
Lymphocytes Relative: 25 % (ref 12–46)
Lymphs Abs: 1.8 10*3/uL (ref 0.7–4.0)
MCH: 31.5 pg (ref 26.0–34.0)
MCHC: 33.3 g/dL (ref 30.0–36.0)
MCV: 94.5 fL (ref 78.0–100.0)
Monocytes Absolute: 0.5 10*3/uL (ref 0.1–1.0)
Monocytes Relative: 7 % (ref 3–12)
NEUTROS PCT: 67 % (ref 43–77)
Neutro Abs: 4.8 10*3/uL (ref 1.7–7.7)
PLATELETS: 189 10*3/uL (ref 150–400)
RBC: 4.22 MIL/uL (ref 3.87–5.11)
RDW: 13.3 % (ref 11.5–15.5)
WBC: 7.2 10*3/uL (ref 4.0–10.5)

## 2013-03-31 LAB — URINALYSIS, ROUTINE W REFLEX MICROSCOPIC
Glucose, UA: NEGATIVE mg/dL
Ketones, ur: 15 mg/dL — AB
NITRITE: NEGATIVE
PH: 5.5 (ref 5.0–8.0)
Protein, ur: 30 mg/dL — AB
SPECIFIC GRAVITY, URINE: 1.035 — AB (ref 1.005–1.030)
UROBILINOGEN UA: 1 mg/dL (ref 0.0–1.0)

## 2013-03-31 LAB — COMPREHENSIVE METABOLIC PANEL
ALBUMIN: 4.5 g/dL (ref 3.5–5.2)
ALK PHOS: 60 U/L (ref 39–117)
ALT: 13 U/L (ref 0–35)
AST: 19 U/L (ref 0–37)
BUN: 10 mg/dL (ref 6–23)
CALCIUM: 9.9 mg/dL (ref 8.4–10.5)
CO2: 25 mEq/L (ref 19–32)
Chloride: 100 mEq/L (ref 96–112)
Creatinine, Ser: 0.95 mg/dL (ref 0.50–1.10)
GFR calc non Af Amer: 80 mL/min — ABNORMAL LOW (ref >90)
GLUCOSE: 92 mg/dL (ref 70–99)
POTASSIUM: 4 meq/L (ref 3.7–5.3)
SODIUM: 138 meq/L (ref 137–147)
TOTAL PROTEIN: 7.5 g/dL (ref 6.0–8.3)
Total Bilirubin: 1 mg/dL (ref 0.3–1.2)

## 2013-03-31 LAB — URINE MICROSCOPIC-ADD ON

## 2013-03-31 LAB — LIPASE, BLOOD: LIPASE: 20 U/L (ref 11–59)

## 2013-03-31 LAB — POC URINE PREG, ED: PREG TEST UR: NEGATIVE

## 2013-03-31 MED ORDER — SODIUM CHLORIDE 0.9 % IV BOLUS (SEPSIS)
1000.0000 mL | Freq: Once | INTRAVENOUS | Status: AC
  Administered 2013-03-31: 1000 mL via INTRAVENOUS

## 2013-03-31 MED ORDER — ESOMEPRAZOLE MAGNESIUM 40 MG PO CPDR: 40.0000 mg | DELAYED_RELEASE_CAPSULE | Freq: Every day | ORAL | Status: DC

## 2013-03-31 MED ORDER — ONDANSETRON HCL 4 MG/2ML IJ SOLN
4.0000 mg | Freq: Once | INTRAMUSCULAR | Status: AC
  Administered 2013-03-31: 4 mg via INTRAVENOUS
  Filled 2013-03-31: qty 2

## 2013-03-31 MED ORDER — GI COCKTAIL ~~LOC~~
30.0000 mL | Freq: Once | ORAL | Status: AC
  Administered 2013-03-31: 30 mL via ORAL
  Filled 2013-03-31: qty 30

## 2013-03-31 NOTE — ED Notes (Addendum)
Pt tolerated water and Ginger Ale.

## 2013-03-31 NOTE — ED Notes (Addendum)
Pt given water after GI cocktail.

## 2013-03-31 NOTE — ED Provider Notes (Signed)
CSN: [id number]     Arrival date & time 03/31/13  0919 History   First MD Initiated Contact with Patient 03/31/13 [phone]     Chief Complaint  Patient presents with  . Abdominal Pain  . Emesis  . syncope yesterday      (Consider location/radiation/quality/duration/timing/severity/associated sxs/prior Treatment) HPI Comments: Patient presents with an open vomiting. She states that yesterday she started having some pain in her epigastrium with associated vomiting. She denies history of abdominal problems in the past. She does drink occasional alcohol the last time was on Friday. She denies any abdominal surgeries. She denies any known fevers but she felt like she might have fever yesterday. Her emesis is nonbloody and nonbilious. She denies any diarrhea or change in her stool habits. She denies any history of known gallbladder disease. Her pain did not seem to be relieved PE. It's been constant and unchanged and chest today.  Patient is a 28 y.o. female presenting with abdominal pain and vomiting.  Abdominal Pain Associated symptoms: nausea and vomiting   Associated symptoms: no chest pain, no chills, no cough, no diarrhea, no fatigue, no fever, no hematuria and no shortness of breath   Emesis Associated symptoms: abdominal pain   Associated symptoms: no arthralgias, no chills, no diarrhea and no headaches     History reviewed. No pertinent past medical history. History reviewed. No pertinent past surgical history. History reviewed. No pertinent family history. History  Substance Use Topics  . Smoking status: Current Every Day Smoker -- 0.50 packs/day    Types: Cigarettes  . Smokeless tobacco: Never Used  . Alcohol Use: Yes     Comment: Vodka ocassionally   OB History   Grav Para Term Preterm Abortions TAB SAB Ect Mult Living   0              Review of Systems  Constitutional: Negative for fever, chills, diaphoresis and fatigue.  HENT: Negative for congestion, rhinorrhea and  sneezing.   Eyes: Negative.   Respiratory: Negative for cough, chest tightness and shortness of breath.   Cardiovascular: Negative for chest pain and leg swelling.  Gastrointestinal: Positive for nausea, vomiting and abdominal pain. Negative for diarrhea and blood in stool.  Genitourinary: Negative for frequency, hematuria, flank pain and difficulty urinating.  Musculoskeletal: Negative for arthralgias and back pain.  Skin: Negative for rash.  Neurological: Negative for dizziness, speech difficulty, weakness, numbness and headaches.      Allergies  Aspirin  Home Medications   Current Outpatient Rx  Name  Route  Sig  Dispense  Refill  . medroxyPROGESTERone (DEPO-PROVERA) 150 MG/ML injection   Intramuscular   Inject 150 mg into the muscle every 3 (three) months.         . esomeprazole (NEXIUM) 40 MG capsule   Oral   Take 1 capsule (40 mg total) by mouth daily.   30 capsule   0    BP 119/59  Pulse 79  Temp(Src) 98.1 F (36.7 C) (Oral)  Resp 16  SpO2 100% Physical Exam  Constitutional: She is oriented to person, place, and time. She appears well-developed and well-nourished.  HENT:  Head: Normocephalic and atraumatic.  Eyes: Pupils are equal, round, and reactive to light.  Neck: Normal range of motion. Neck supple.  Cardiovascular: Normal rate, regular rhythm and normal heart sounds.   Pulmonary/Chest: Effort normal and breath sounds normal. No respiratory distress. She has no wheezes. She has no rales. She exhibits no tenderness.  Abdominal: Soft. Bowel sounds  are normal. There is tenderness (Mild tenderness in the epigastrium. No pain to the lower abdomen. No specific tenderness to the right upper quadrant). There is no rebound and no guarding.  Musculoskeletal: Normal range of motion. She exhibits no edema.  Lymphadenopathy:    She has no cervical adenopathy.  Neurological: She is alert and oriented to person, place, and time.  Skin: Skin is warm and dry. No rash  noted.  Psychiatric: She has a normal mood and affect.    ED Course  Procedures (including critical care time) Labs Review Results for orders placed during the hospital encounter of 03/31/13  CBC WITH DIFFERENTIAL      Result Value Ref Range   WBC 7.2  4.0 - 10.5 K/uL   RBC 4.22  3.87 - 5.11 MIL/uL   Hemoglobin 13.3  12.0 - 15.0 g/dL   HCT 16.139.9  09.636.0 - 04.546.0 %   MCV 94.5  78.0 - 100.0 fL   MCH 31.5  26.0 - 34.0 pg   MCHC 33.3  30.0 - 36.0 g/dL   RDW 40.913.3  81.111.5 - 91.415.5 %   Platelets 189  150 - 400 K/uL   Neutrophils Relative % 67  43 - 77 %   Neutro Abs 4.8  1.7 - 7.7 K/uL   Lymphocytes Relative 25  12 - 46 %   Lymphs Abs 1.8  0.7 - 4.0 K/uL   Monocytes Relative 7  3 - 12 %   Monocytes Absolute 0.5  0.1 - 1.0 K/uL   Eosinophils Relative 2  0 - 5 %   Eosinophils Absolute 0.1  0.0 - 0.7 K/uL   Basophils Relative 0  0 - 1 %   Basophils Absolute 0.0  0.0 - 0.1 K/uL  COMPREHENSIVE METABOLIC PANEL      Result Value Ref Range   Sodium 138  137 - 147 mEq/L   Potassium 4.0  3.7 - 5.3 mEq/L   Chloride 100  96 - 112 mEq/L   CO2 25  19 - 32 mEq/L   Glucose, Bld 92  70 - 99 mg/dL   BUN 10  6 - 23 mg/dL   Creatinine, Ser 7.820.95  0.50 - 1.10 mg/dL   Calcium 9.9  8.4 - 95.610.5 mg/dL   Total Protein 7.5  6.0 - 8.3 g/dL   Albumin 4.5  3.5 - 5.2 g/dL   AST 19  0 - 37 U/L   ALT 13  0 - 35 U/L   Alkaline Phosphatase 60  39 - 117 U/L   Total Bilirubin 1.0  0.3 - 1.2 mg/dL   GFR calc non Af Amer 80 (*) >90 mL/min   GFR calc Af Amer >90  >90 mL/min  LIPASE, BLOOD      Result Value Ref Range   Lipase 20  11 - 59 U/L  URINALYSIS, ROUTINE W REFLEX MICROSCOPIC      Result Value Ref Range   Color, Urine AMBER (*) YELLOW   APPearance CLOUDY (*) CLEAR   Specific Gravity, Urine 1.035 (*) 1.005 - 1.030   pH 5.5  5.0 - 8.0   Glucose, UA NEGATIVE  NEGATIVE mg/dL   Hgb urine dipstick SMALL (*) NEGATIVE   Bilirubin Urine SMALL (*) NEGATIVE   Ketones, ur 15 (*) NEGATIVE mg/dL   Protein, ur 30 (*)  NEGATIVE mg/dL   Urobilinogen, UA 1.0  0.0 - 1.0 mg/dL   Nitrite NEGATIVE  NEGATIVE   Leukocytes, UA SMALL (*) NEGATIVE  URINE MICROSCOPIC-ADD ON  Result Value Ref Range   Squamous Epithelial / LPF MANY (*) RARE   WBC, UA 7-10  <3 WBC/hpf   RBC / HPF 3-6  <3 RBC/hpf   Bacteria, UA FEW (*) RARE   Urine-Other RARE YEAST    POC URINE PREG, ED      Result Value Ref Range   Preg Test, Ur NEGATIVE  NEGATIVE   No results found.   Imaging Review No results found.   EKG Interpretation None      MDM   Final diagnoses:  Gastritis    Patient presents with epigastric pain and vomiting. Her labs are unremarkable. There is no evidence of pancreatitis or gallbladder disease. There is no pain over her gallbladder. Her pain was improved with Zofran and a GI cocktail. I feel that her pain is likely related to gastritis. She is well-appearing now and is tolerating fluids without vomiting. Advised to refrain from alcohol and to a clear liquid diet for the next few days. I gave her prescription for Nexium and a referral to follow up with gastroenterology if her symptoms are not improving.    Rolan Bucco, MD 03/31/13 [phone]

## 2013-03-31 NOTE — ED Notes (Addendum)
Pt reports she vomited at work yesterday and passed out, woke up on the floor. Went home and has continued to vomit. Abdominal pain 10/10. Stomach feels "knotted up". Denies diarrhea. Woke up this morning in a cold sweat and vomited.

## 2013-03-31 NOTE — Discharge Instructions (Signed)

## 2013-06-18 ENCOUNTER — Encounter (HOSPITAL_COMMUNITY): Payer: Self-pay | Admitting: Emergency Medicine

## 2013-06-18 ENCOUNTER — Emergency Department (HOSPITAL_COMMUNITY)
Admission: EM | Admit: 2013-06-18 | Discharge: 2013-06-18 | Disposition: A | Discharge status: Home / Self Care | Payer: PRIVATE HEALTH INSURANCE | Attending: Emergency Medicine | Admitting: Emergency Medicine

## 2013-06-18 DIAGNOSIS — Z79899 Other long term (current) drug therapy: Secondary | ICD-10-CM | POA: Insufficient documentation

## 2013-06-18 DIAGNOSIS — R112 Nausea with vomiting, unspecified: Secondary | ICD-10-CM | POA: Insufficient documentation

## 2013-06-18 DIAGNOSIS — F17200 Nicotine dependence, unspecified, uncomplicated: Secondary | ICD-10-CM | POA: Insufficient documentation

## 2013-06-18 LAB — CBC WITH DIFFERENTIAL/PLATELET
BASOS ABS: 0 10*3/uL (ref 0.0–0.1)
BASOS PCT: 0 % (ref 0–1)
EOS ABS: 0.1 10*3/uL (ref 0.0–0.7)
EOS PCT: 1 % (ref 0–5)
HEMATOCRIT: 42.2 % (ref 36.0–46.0)
HEMOGLOBIN: 14.2 g/dL (ref 12.0–15.0)
Lymphocytes Relative: 34 % (ref 12–46)
Lymphs Abs: 2.8 10*3/uL (ref 0.7–4.0)
MCH: 31.8 pg (ref 26.0–34.0)
MCHC: 33.6 g/dL (ref 30.0–36.0)
MCV: 94.6 fL (ref 78.0–100.0)
MONO ABS: 0.5 10*3/uL (ref 0.1–1.0)
MONOS PCT: 6 % (ref 3–12)
NEUTROS ABS: 4.8 10*3/uL (ref 1.7–7.7)
Neutrophils Relative %: 59 % (ref 43–77)
Platelets: 196 10*3/uL (ref 150–400)
RBC: 4.46 MIL/uL (ref 3.87–5.11)
RDW: 12.5 % (ref 11.5–15.5)
WBC: 8.1 10*3/uL (ref 4.0–10.5)

## 2013-06-18 LAB — COMPREHENSIVE METABOLIC PANEL
ALBUMIN: 4.5 g/dL (ref 3.5–5.2)
ALT: 15 U/L (ref 0–35)
AST: 20 U/L (ref 0–37)
Alkaline Phosphatase: 65 U/L (ref 39–117)
BILIRUBIN TOTAL: 0.3 mg/dL (ref 0.3–1.2)
BUN: 6 mg/dL (ref 6–23)
CALCIUM: 9.5 mg/dL (ref 8.4–10.5)
CHLORIDE: 106 meq/L (ref 96–112)
CO2: 22 mEq/L (ref 19–32)
CREATININE: 0.84 mg/dL (ref 0.50–1.10)
GFR calc Af Amer: >90 mL/min (ref >90)
GFR calc non Af Amer: >90 mL/min (ref >90)
Glucose, Bld: 85 mg/dL (ref 70–99)
Potassium: 3.8 mEq/L (ref 3.7–5.3)
Sodium: 143 mEq/L (ref 137–147)
Total Protein: 7.9 g/dL (ref 6.0–8.3)

## 2013-06-18 LAB — URINALYSIS, ROUTINE W REFLEX MICROSCOPIC
Glucose, UA: NEGATIVE mg/dL
KETONES UR: NEGATIVE mg/dL
LEUKOCYTES UA: NEGATIVE
NITRITE: NEGATIVE
PH: 5.5 (ref 5.0–8.0)
PROTEIN: NEGATIVE mg/dL
Specific Gravity, Urine: 1.031 — ABNORMAL HIGH (ref 1.005–1.030)
Urobilinogen, UA: 1 mg/dL (ref 0.0–1.0)

## 2013-06-18 LAB — URINE MICROSCOPIC-ADD ON

## 2013-06-18 LAB — POC URINE PREG, ED: PREG TEST UR: NEGATIVE

## 2013-06-18 LAB — LIPASE, BLOOD: Lipase: 28 U/L (ref 11–59)

## 2013-06-18 MED ORDER — ONDANSETRON 8 MG PO TBDP
8.0000 mg | ORAL_TABLET | Freq: Once | ORAL | Status: AC
  Administered 2013-06-18: 8 mg via ORAL
  Filled 2013-06-18: qty 1

## 2013-06-18 MED ORDER — SODIUM CHLORIDE 0.9 % IV BOLUS (SEPSIS): 1000.0000 mL | Freq: Once | INTRAVENOUS | Status: DC

## 2013-06-18 MED ORDER — ONDANSETRON 8 MG PO TBDP: ORAL_TABLET | ORAL | Status: DC

## 2013-06-18 NOTE — ED Notes (Signed)
Bed: WA09 Expected date:  Expected time:  Means of arrival:  Comments: Hold for triage 

## 2013-06-18 NOTE — ED Provider Notes (Signed)
CSN: [id number]     Arrival date & time 06/18/13  1436 History   First MD Initiated Contact with Patient 06/18/13 1552     Chief Complaint  Patient presents with  . Abdominal Pain     (Consider location/radiation/quality/duration/timing/severity/associated sxs/prior Treatment) Patient is a 28 y.o. female presenting with abdominal pain. The history is provided by the patient and medical records. No language interpreter was used.  Abdominal Pain Associated symptoms: nausea and vomiting   Associated symptoms: no chest pain, no constipation, no cough, no diarrhea, no dysuria, no fatigue, no fever, no hematuria and no shortness of breath     Beverlee NimsLeteshia R Gough is a 28 y.o. female  with a hx of no major medical history presents to the Emergency Department complaining of nausea and one episode of emesis onset this afternoon with nausea persistent for several days. 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 Associated symptoms include nonbloody nonbilious emesis without diarrhea.  Patient has not attempted any over-the-counter treatments. No aggravating or alleviating symptoms.  Patient reports eating a cheeseburger and french fries approximately 30 minutes prior to her emesis. Pt denies fever, chills, headache, neck pain, chest pain, shortness of breath, abdominal pain, diarrhea, weakness, dizziness, syncope, dysuria, hematuria, vaginal discharge, vaginal bleeding.  Last menstrual period approximately one year ago when patient began receiving Depo-Provera injections.  Patient denies sexual contacts within the last 7 months.     History reviewed. No pertinent past medical history. History reviewed. No pertinent past surgical history. No family history on file. History  Substance Use Topics  . Smoking status: Current Every Day Smoker -- 0.50 packs/day    Types: Cigarettes  . Smokeless tobacco: Never Used  . Alcohol Use: Yes     Comment: Vodka  ocassionally   OB History   Grav Para Term Preterm Abortions TAB SAB Ect Mult Living   0              Review of Systems  Constitutional: Negative for fever, diaphoresis, appetite change, fatigue and unexpected weight change.  HENT: Negative for mouth sores and trouble swallowing.   Respiratory: Negative for cough, chest tightness, shortness of breath, wheezing and stridor.   Cardiovascular: Negative for chest pain and palpitations.  Gastrointestinal: Positive for nausea and vomiting. Negative for abdominal pain, diarrhea, constipation, blood in stool, abdominal distention and rectal pain.  Genitourinary: Negative for dysuria, urgency, frequency, hematuria, flank pain and difficulty urinating.  Musculoskeletal: Negative for back pain, neck pain and neck stiffness.  Skin: Negative for rash.  Neurological: Negative for weakness.  Hematological: Negative for adenopathy.  Psychiatric/Behavioral: Negative for confusion.  All other systems reviewed and are negative.     Allergies  Aspirin  Home Medications   Prior to Admission medications   Medication Sig Start Date End Date Taking? Authorizing Provider  medroxyPROGESTERone (DEPO-PROVERA) 150 MG/ML injection Inject 150 mg into the muscle every 3 (three) months.   Yes Historical Provider, MD  ondansetron (ZOFRAN ODT) 8 MG disintegrating tablet 8mg  ODT q4 hours prn nausea 06/18/13   Kingsley Farace, PA-C   BP 117/67  Pulse 79  Temp(Src) 98.7 F (37.1 C) (Oral)  Resp 16  SpO2 98% Physical Exam  Nursing note and vitals reviewed. Constitutional: She is oriented to person, place, and time. She appears well-developed and well-nourished. No distress.  Awake, alert, nontoxic appearance  HENT:  Head: Normocephalic and atraumatic.  Mouth/Throat: Oropharynx is clear and moist. No oropharyngeal exudate.  Eyes: Conjunctivae are normal. No scleral icterus.  Neck: Normal range of motion. Neck supple.  Cardiovascular: Normal rate, regular  rhythm and intact distal pulses.   Pulmonary/Chest: Effort normal and breath sounds normal. No respiratory distress. She has no wheezes.  Abdominal: Soft. Bowel sounds are normal. She exhibits no distension and no mass. There is no tenderness. There is no rebound and no guarding.  Abdomen soft and nontender, no rigidity or guarding  Musculoskeletal: Normal range of motion. She exhibits no edema.  Neurological: She is alert and oriented to person, place, and time. She exhibits normal muscle tone. Coordination normal.  Speech is clear and goal oriented Moves extremities without ataxia  Skin: Skin is warm and dry. She is not diaphoretic. No erythema.  Psychiatric: She has a normal mood and affect.    ED Course  Procedures (including critical care time) Labs Review Labs Reviewed  URINALYSIS, ROUTINE W REFLEX MICROSCOPIC - Abnormal; Notable for the following:    Color, Urine AMBER (*)    APPearance TURBID (*)    Specific Gravity, Urine 1.031 (*)    Hgb urine dipstick TRACE (*)    Bilirubin Urine SMALL (*)    All other components within normal limits  CBC WITH DIFFERENTIAL  COMPREHENSIVE METABOLIC PANEL  URINE MICROSCOPIC-ADD ON  LIPASE, BLOOD  POC URINE PREG, ED    Imaging Review No results found.   EKG Interpretation None      MDM   Final diagnoses:  Nausea and vomiting    Lucky RathkeLeteshia R Friend presents with nausea and emesis times one after eating a cheeseburger today. Patient reports abdominal pain however patient denies any abdominal pain. Unknown sick contacts. Will obtain labs, give ondansetron and by mouth trial.    Patient afebrile without tachycardia, moist mucous membranes. No evidence of dehydration.  6:21 PM On a repeat exam patient abdomen soft and nontender.  Patient tolerating by mouth without difficulty. Urine with increased specific gravity indicative of mild dehydration. Discussed this with the patient who agrees to increase fluid intake.  Patient is  nontoxic, nonseptic appearing, in no apparent distress.  Patient's pain and other symptoms adequately managed in emergency department.  Fluid bolus given.  Labs, imaging and vitals reviewed.  Patient does not meet the SIRS or Sepsis criteria.  On repeat exam patient does not have a surgical abdomin and there are no peritoneal signs.  No indication of appendicitis, bowel obstruction, bowel perforation, cholecystitis, diverticulitis, PID or ectopic pregnancy.   I have personally reviewed patient's vitals, nursing note and any pertinent labs or imaging.  I performed an undressed physical exam.    At this time, it has been determined that no acute conditions requiring further emergency intervention. The patient/guardian have been advised of the diagnosis and plan. I reviewed all labs and imaging including any potential incidental findings. We have discussed signs and symptoms that warrant return to the ED, such as fever, intractable vomiting, development of abdominal pain.  Patient/guardian has voiced understanding and agreed to follow-up with the PCP or specialist in 3 days.  Vital signs are stable at discharge.   BP 117/67  Pulse 79  Temp(Src) 98.7 F (37.1 C) (Oral)  Resp 16  SpO2 98%        Dierdre ForthHannah Hannia Matchett, PA-C 06/18/13 1823

## 2013-06-18 NOTE — ED Notes (Signed)
Per pt, states abdominal pain for 2 days-N/V

## 2013-06-18 NOTE — Discharge Instructions (Signed)
1. Medications: zofran, usual home medications °2. Treatment: rest, drink plenty of fluids,  °3. Follow Up: Please followup with your primary doctor for discussion of your diagnoses and further evaluation after today's visit; if you do not have a primary care doctor use the resource guide provided to find one;  ° ° °Nausea and Vomiting °Nausea is a sick feeling that often comes before throwing up (vomiting). Vomiting is a reflex where stomach contents come out of your mouth. Vomiting can cause severe loss of body fluids (dehydration). Children and elderly adults can become dehydrated quickly, especially if they also have diarrhea. Nausea and vomiting are symptoms of a condition or disease. It is important to find the cause of your symptoms. °CAUSES  °· Direct irritation of the stomach lining. This irritation can result from increased acid production (gastroesophageal reflux disease), infection, food poisoning, taking certain medicines (such as nonsteroidal anti-inflammatory drugs), alcohol use, or tobacco use. °· Signals from the brain. These signals could be caused by a headache, heat exposure, an inner ear disturbance, increased pressure in the brain from injury, infection, a tumor, or a concussion, pain, emotional stimulus, or metabolic problems. °· An obstruction in the gastrointestinal tract (bowel obstruction). °· Illnesses such as diabetes, hepatitis, gallbladder problems, appendicitis, kidney problems, cancer, sepsis, atypical symptoms of a heart attack, or eating disorders. °· Medical treatments such as chemotherapy and radiation. °· Receiving medicine that makes you sleep (general anesthetic) during surgery. °DIAGNOSIS °Your caregiver may ask for tests to be done if the problems do not improve after a few days. Tests may also be done if symptoms are severe or if the reason for the nausea and vomiting is not clear. Tests may include: °· Urine tests. °· Blood tests. °· Stool tests. °· Cultures (to look for  evidence of infection). °· X-rays or other imaging studies. °Test results can help your caregiver make decisions about treatment or the need for additional tests. °TREATMENT °You need to stay well hydrated. Drink frequently but in small amounts. You may wish to drink water, sports drinks, clear broth, or eat frozen ice pops or gelatin dessert to help stay hydrated. When you eat, eating slowly may help prevent nausea. There are also some antinausea medicines that may help prevent nausea. °HOME CARE INSTRUCTIONS  °· Take all medicine as directed by your caregiver. °· If you do not have an appetite, do not force yourself to eat. However, you must continue to drink fluids. °· If you have an appetite, eat a normal diet unless your caregiver tells you differently. °· Eat a variety of complex carbohydrates (rice, wheat, potatoes, bread), lean meats, yogurt, fruits, and vegetables. °· Avoid high-fat foods because they are more difficult to digest. °· Drink enough water and fluids to keep your urine clear or pale yellow. °· If you are dehydrated, ask your caregiver for specific rehydration instructions. Signs of dehydration may include: °· Severe thirst. °· Dry lips and mouth. °· Dizziness. °· Dark urine. °· Decreasing urine frequency and amount. °· Confusion. °· Rapid breathing or pulse. °SEEK IMMEDIATE MEDICAL CARE IF:  °· You have blood or brown flecks (like coffee grounds) in your vomit. °· You have black or bloody stools. °· You have a severe headache or stiff neck. °· You are confused. °· You have severe abdominal pain. °· You have chest pain or trouble breathing. °· You do not urinate at least once every 8 hours. °· You develop cold or clammy skin. °· You continue to vomit for longer than 24 to 48   hours. °· You have a fever. °MAKE SURE YOU:  °· Understand these instructions. °· Will watch your condition. °· Will get help right away if you are not doing well or get worse. °Document Released: 12/24/2004 Document  Revised: 03/18/2011 Document Reviewed: 05/23/2010 °ExitCare® Patient Information ©2014 [employer]. ° ° °Emergency Department Resource Guide °1) Find a Doctor and Pay Out of Pocket °Although you won't have to find out who is covered by your insurance plan, it is a good idea to ask around and get recommendations. You will then need to call the office and see if the doctor you have chosen will accept you as a new patient and what types of options they offer for patients who are self-pay. Some doctors offer discounts or will set up payment plans for their patients who do not have insurance, but you will need to ask so you aren't surprised when you get to your appointment. ° °2) Contact Your Local Health Department °Not all health departments have doctors that can see patients for sick visits, but many do, so it is worth a call to see if yours does. If you don't know where your local health department is, you can check in your phone book. The CDC also has a tool to help you locate your state's health department, and many state websites also have listings of all of their local health departments. ° °3) Find a Walk-in Clinic °If your illness is not likely to be very severe or complicated, you may want to try a walk in clinic. These are popping up all over the country in pharmacies, drugstores, and shopping centers. They're usually staffed by nurse practitioners or physician assistants that have been trained to treat common illnesses and complaints. They're usually fairly quick and inexpensive. However, if you have serious medical issues or chronic medical problems, these are probably not your best option. ° °No Primary Care Doctor: °- Call Health Connect at  832-8000 - they can help you locate a primary care doctor that  accepts your insurance, provides certain services, etc. °- Physician Referral Service- 1-800-533-3463 ° °Chronic Pain Problems: °Organization         Address  Phone   Notes  °[facility]  (336) [phone] Patients need to be referred by their primary care doctor.  ° °Medication Assistance: °Organization         Address  Phone   Notes  °[place] [facility] [address] °[place], [place] [address] (336) 641-8030 --Must be a resident of [place] °-- Must have NO insurance coverage whatsoever (no Medicaid/ Medicare, etc.) °-- The pt. MUST have a primary care doctor that directs their care regularly and follows them in the community °  °[facility]  (866) 331-1348   °[employer]  (888) 892-1162   ° °Agencies that provide inexpensive medical care: °Organization         Address  Phone   Notes  °[facility]  (336) 832-8035   °[facility]    (336) 832-7272   °[facility] [address] °[place], [place] [address] (336) 832-4777   °[facility] [address], ° (336) 271-4999   °[facility]    (336) 373-0678   °[facility]    (336) 272-1050   °[facility] ° [address],  Phone:  (336) 832-4444, Fax:  (336) 832-4440 Hours of Operation:  9   am - 6 pm, M-F.  Also accepts Medicaid/Medicare and self-pay.  °[facility] ° [address], [place] Phone: (336) 832-3150, Fax: (336) [phone]. Hours of Operation:  8:30 am - 5:30 pm, M-F.  Also accepts Medicaid and self-pay.  °[facility] [place] [address], [place] Phone: (336) 878-6027   °[facility] [address], [place], [place] (336)723-1848, Ext. 123 Mondays & Thursdays: 7-9 AM.  First 15 patients are seen on a first come, first serve basis. °  ° °Medicaid-accepting [place] Providers: ° °Organization         Address  Phone   Notes  °[facility] [address], [place] (336) 641-2100 Also accepts self-pay patients.  °[facility] [address],  [place] ° (336) 856-9996   °[facility] [address], [place] (336) 288-8857   °[facility] [address] [place] [address], [place] (336) 299-7000   °Veita Bland [address], [place]  ° (336) 373-1557 Only accepts [place] Medicaid patients after they have their name applied to their card.  ° °Self-Pay (no insurance) in [place]: ° °Organization         Address  Phone   Notes  °Sickle Cell Patients, [facility] [address], [place] (336) 832-1970   °[facility] [address], [place] (336) 832-4400   °[facility] ° [address], [place] (336) 992-4800   °[facility]/Dr. Osei-Bonsu ° [address] [place] [address], [place] or [address], [place] (336) [phone] Phone number for both [place] and [place] locations is the same.  °[facility] [address], [place] (336) 299-0000   °[facility] [address] [place] [address], [place] or [address] (336) 852-7530 °(336) 878-2260   °[facility] [address], [place] (336) 350-1642, phone; (336) 294-5005, fax Sees patients 1st and 3rd Saturday of every month.  Must not qualify for public or private insurance (i.e. Medicaid, Medicare, [place] Health Choice, Veterans' Benefits) • Household income should be no more than 200% of the poverty level •The clinic cannot treat you if you are pregnant or think you are pregnant • Sexually transmitted diseases are not treated at the clinic.  ° ° °Dental Care: °Organization         Address  Phone  Notes  °[facility] [address], [place] (336) 641-6152 Accepts children up to age 21 who are enrolled in Medicaid or [place] Health Choice; pregnant women with a Medicaid card; and children who have applied for Medicaid or [place] [facility]  Choice, but were declined, whose parents can pay a reduced fee at time of service.  °[facility] [place]  [address], [place] (336) 641-7733 Accepts children up to age 21 who are enrolled in Medicaid or [place] Health Choice; pregnant women with a Medicaid card; and children who have applied for Medicaid or  Health Choice, but were declined, whose parents can pay a reduced fee at time of service.  °[facility] ° [address], [place] (336) 641-4533 Patients are seen by appointment only. Walk-ins are not accepted. [facility] will see patients 18 years of   age and older. °Monday - Tuesday (8am-5pm) °Most Wednesdays (8:30-5pm) °$30 per visit, cash only  °[facility] ° [address], [place] (336) 641-4533 Patients are seen by appointment only. Walk-ins are not accepted. [facility] will see patients 18 years of age and older. °One Wednesday Evening (Monthly: Volunteer Based).  $30 per visit, cash only  °[facility]  (919) 537-3737 for adults; Children under age 4, call Graduate Pediatric Dentistry at (919) [phone]. Children aged 4-14, please call (919) [phone] to request a pediatric application. ° Dental services are provided in all areas of dental care including fillings, crowns and bridges, complete and partial dentures, implants, gum treatment, root canals, and extractions. Preventive care is also provided. Treatment is provided to both adults and children. °Patients are selected via a lottery and there is often a waiting list. °  °[facility] [address], °[place] ° (336) 763-8833 [email or web address] °  °[facility] [address], [place], [place] (336)723-1848, Ext. 123 Second and Fourth Thursday of each month, opens at 6:30 AM; Clinic ends at 9 AM.  Patients are seen on a first-come first-served basis, and a limited number are seen during each  clinic.  ° °[facility] ° [address], [place], [place] (336) 723-7904   Eligibility Requirements °You must have lived in [place], [place], or [place] counties for at least the last three months. °  You cannot be eligible for state or federal sponsored healthcare insurance, including Veterans Administration, Medicaid, or Medicare. °  You generally cannot be eligible for healthcare insurance through your employer.  °  How to apply: °Eligibility screenings are held every Tuesday and Wednesday afternoon from 1:00 pm until 4:00 pm. You do not need an appointment for the interview!  °[facility] [address], [place], [place] 336-631-2330   °[facility]  336-342-8273   °[facility]  336-703-3100   °[facility]  336-570-6415   ° °Behavioral Health Resources in the Community: °Intensive Outpatient Programs °Organization         Address  Phone  Notes  °[facility] [address], [place], [place] 336-878-6098   °[facility] [address], [place], [place] 336-832-9800   °[facility] [address], [place], [place] ° 336-882-2125   °[facility] [address],  °[place], [place] 1-800-853-5163 or 336-641-4981   °Substance Abuse Resources °Organization         Address  Phone  Notes  °[facility]  336-882-2125   °[facility]  336-784-9470   °[facility]  336-285-9073   °[facility]  336-845-3988   °Residential & Outpatient Substance Abuse Program  1-800-659-3381   °Psychological Services °Organization         Address  Phone  Notes  °[facility]  336- 832-9600   °[facility]  336- 378-7881   °[facility] [address], [place] 1-800-853-5163 or 336-641-4981   ° °Mobile Crisis Teams °Organization         Address  Phone  Notes  °[facility], Mobile  Crisis Care Unit  1-877-626-1772   °Assertive °[facility] ° [address] [place], [place] 336-834-9664   °Sharon DeEsch [address] °[place] 336-554-5454   ° °Self-Help/Support Groups °Organization           Address  Phone             Notes  [place] [place] - variety of support groups  [place] Call for more information  Narcotics Anonymous (NA), [facility] [address], [employer] [place]  2 meetings at this location   Special educational needs teacher         Address  Phone  Notes  [facility] [place],    [place]  1-410-359-9697   [facility]  [address] [place] [address]8, [place], [place]   [place], [place] 513-386-2151 Admissions: 8am-3pm M-F  [facility] [place] [address],    [place], [place] 832-549-8264   [facility] [address] [place], [place], [place]   [facility] [address]  [place], [place]   [facility] - Intensive Outpatient [address], [place], [place]   [facility] ([place] [address]  [place], [place] 1-916-789-8139 or (508) 439-9045   [facility] [address], [place], [place] Accepts Medicaid  [facility] [address]  [place] 1-929-443-8871 Substance Abuse/Addiction Treatment   [facility]         Address  Phone  Notes  [facility]  (623)412-3837   Domenic Schwab, PhD [address] [place], [place]   431-669-6284 or 414-822-0425   [facility] [place], [place] 224-335-3278   [facility] [address], [place], [place] [phone] Insurance/Medicaid/sponsorship through [facility] [address]                                    [place], [place] [phone] [place] [address] [place], [place] [phone]    Dr. Adele Schilder  [phone]   [facility] 1) [address] [place] 2) [place] 3)  [address], [place] [phone]  ([phone]   [place] 810-072-0840 or [phone] (After Hours)

## 2013-06-18 NOTE — ED Provider Notes (Signed)
Medical screening examination/treatment/procedure(s) were conducted as a shared visit with non-physician practitioner(s) and myself.  I personally evaluated the patient during the encounter.   EKG Interpretation None      I interviewed and examined the patient. Lungs are CTAB. Cardiac exam wnl. Abdomen soft.  Pt asx on my exam. Sx likely related to food intake. Do not think imaging needed.   Junius ArgyleForrest S Annisha Baar, MD 06/18/13 2312

## 2013-06-18 NOTE — ED Notes (Signed)
Pt asked if she can have PO fluids instead of IV, Hannah PA notified, order received for PO challenge. Pt given ginger ale per her request.

## 2013-06-18 NOTE — ED Notes (Signed)
Called lab to inquire about delay in results, they will call back when they find out what happened.

## 2013-07-25 ENCOUNTER — Encounter (HOSPITAL_COMMUNITY): Payer: Self-pay | Admitting: Emergency Medicine

## 2013-07-25 ENCOUNTER — Emergency Department (HOSPITAL_COMMUNITY)
Admission: EM | Admit: 2013-07-25 | Discharge: 2013-07-25 | Disposition: A | Discharge status: Home / Self Care | Payer: No Typology Code available for payment source | Attending: Emergency Medicine | Admitting: Emergency Medicine

## 2013-07-25 DIAGNOSIS — Z79899 Other long term (current) drug therapy: Secondary | ICD-10-CM | POA: Insufficient documentation

## 2013-07-25 DIAGNOSIS — Z3202 Encounter for pregnancy test, result negative: Secondary | ICD-10-CM | POA: Insufficient documentation

## 2013-07-25 DIAGNOSIS — K2970 Gastritis, unspecified, without bleeding: Secondary | ICD-10-CM | POA: Insufficient documentation

## 2013-07-25 DIAGNOSIS — F17200 Nicotine dependence, unspecified, uncomplicated: Secondary | ICD-10-CM | POA: Insufficient documentation

## 2013-07-25 DIAGNOSIS — K2990 Gastroduodenitis, unspecified, without bleeding: Principal | ICD-10-CM

## 2013-07-25 LAB — URINALYSIS, ROUTINE W REFLEX MICROSCOPIC
Bilirubin Urine: NEGATIVE
GLUCOSE, UA: NEGATIVE mg/dL
KETONES UR: NEGATIVE mg/dL
LEUKOCYTES UA: NEGATIVE
Nitrite: NEGATIVE
PH: 6 (ref 5.0–8.0)
Protein, ur: NEGATIVE mg/dL
Specific Gravity, Urine: 1.03 (ref 1.005–1.030)
Urobilinogen, UA: 0.2 mg/dL (ref 0.0–1.0)

## 2013-07-25 LAB — COMPREHENSIVE METABOLIC PANEL
ALK PHOS: 66 U/L (ref 39–117)
ALT: 16 U/L (ref 0–35)
AST: 27 U/L (ref 0–37)
Albumin: 4.7 g/dL (ref 3.5–5.2)
Anion gap: 13 (ref 5–15)
BILIRUBIN TOTAL: 0.6 mg/dL (ref 0.3–1.2)
BUN: 11 mg/dL (ref 6–23)
CO2: 23 meq/L (ref 19–32)
Calcium: 9.8 mg/dL (ref 8.4–10.5)
Chloride: 107 mEq/L (ref 96–112)
Creatinine, Ser: 0.77 mg/dL (ref 0.50–1.10)
GLUCOSE: 77 mg/dL (ref 70–99)
POTASSIUM: 4.5 meq/L (ref 3.7–5.3)
Sodium: 143 mEq/L (ref 137–147)
TOTAL PROTEIN: 8.1 g/dL (ref 6.0–8.3)

## 2013-07-25 LAB — URINE MICROSCOPIC-ADD ON

## 2013-07-25 LAB — CBC WITH DIFFERENTIAL/PLATELET
Basophils Absolute: 0 10*3/uL (ref 0.0–0.1)
Basophils Relative: 0 % (ref 0–1)
Eosinophils Absolute: 0.1 10*3/uL (ref 0.0–0.7)
Eosinophils Relative: 1 % (ref 0–5)
HCT: 42.6 % (ref 36.0–46.0)
HEMOGLOBIN: 14.3 g/dL (ref 12.0–15.0)
LYMPHS ABS: 2.9 10*3/uL (ref 0.7–4.0)
Lymphocytes Relative: 28 % (ref 12–46)
MCH: 32.3 pg (ref 26.0–34.0)
MCHC: 33.6 g/dL (ref 30.0–36.0)
MCV: 96.2 fL (ref 78.0–100.0)
MONOS PCT: 7 % (ref 3–12)
Monocytes Absolute: 0.7 10*3/uL (ref 0.1–1.0)
NEUTROS ABS: 6.5 10*3/uL (ref 1.7–7.7)
Neutrophils Relative %: 64 % (ref 43–77)
PLATELETS: 186 10*3/uL (ref 150–400)
RBC: 4.43 MIL/uL (ref 3.87–5.11)
RDW: 12.9 % (ref 11.5–15.5)
WBC: 10.2 10*3/uL (ref 4.0–10.5)

## 2013-07-25 LAB — LIPASE, BLOOD: Lipase: 23 U/L (ref 11–59)

## 2013-07-25 LAB — POC URINE PREG, ED: Preg Test, Ur: NEGATIVE

## 2013-07-25 MED ORDER — OMEPRAZOLE 20 MG PO CPDR: 20.0000 mg | DELAYED_RELEASE_CAPSULE | Freq: Every day | ORAL | Status: DC

## 2013-07-25 MED ORDER — MORPHINE SULFATE 2 MG/ML IJ SOLN
2.0000 mg | Freq: Once | INTRAMUSCULAR | Status: AC
  Administered 2013-07-25: 2 mg via INTRAVENOUS
  Filled 2013-07-25: qty 1

## 2013-07-25 MED ORDER — ONDANSETRON HCL 4 MG/2ML IJ SOLN
4.0000 mg | Freq: Once | INTRAMUSCULAR | Status: AC
  Administered 2013-07-25: 4 mg via INTRAVENOUS
  Filled 2013-07-25: qty 2

## 2013-07-25 MED ORDER — ONDANSETRON 4 MG PO TBDP: ORAL_TABLET | ORAL | Status: DC

## 2013-07-25 NOTE — ED Provider Notes (Signed)
Medical screening examination/treatment/procedure(s) were performed by non-physician practitioner and as supervising physician I was immediately available for consultation/collaboration.   EKG Interpretation None        Christopher J. Pollina, MD 07/25/13 2312 

## 2013-07-25 NOTE — Discharge Instructions (Signed)
1. Medications: zofran, omeprazole, usual home medications 2. Treatment: rest, drink plenty of fluids,  3. Follow Up: Please followup with your primary doctor for discussion of your diagnoses and further evaluation after today's visit; if you do not have a primary care doctor use the resource guide provided to find one;     Gastritis, Adult Gastritis is soreness and puffiness (inflammation) of the lining of the stomach. If you do not get help, gastritis can cause bleeding and sores (ulcers) in the stomach. HOME CARE   Only take medicine as told by your doctor.  If you were given antibiotic medicines, take them as told. Finish the medicines even if you start to feel better.  Drink enough fluids to keep your pee (urine) clear or pale yellow.  Avoid foods and drinks that make your problems worse. Foods you may want to avoid include:  Caffeine or alcohol.  Chocolate.  Mint.  Garlic and onions.  Spicy foods.  Citrus fruits, including oranges, lemons, or limes.  Food containing tomatoes, including sauce, chili, salsa, and pizza.  Fried and fatty foods.  Eat small meals throughout the day instead of large meals. GET HELP RIGHT AWAY IF:   You have black or dark red poop (stools).  You throw up (vomit) blood. It may look like coffee grounds.  You cannot keep fluids down.  Your belly (abdominal) pain gets worse.  You have a fever.  You do not feel better after 1 week.  You have any other questions or concerns. MAKE SURE YOU:   Understand these instructions.  Will watch your condition.  Will get help right away if you are not doing well or get worse. Document Released: 06/12/2007 Document Revised: 03/18/2011 Document Reviewed: 02/06/2011 [facility] Patient Information 2015 [place], [place]. This information is not intended to replace advice given to you by your health care provider. Make sure you discuss any questions you have with your health care  provider.    Emergency Department Resource Guide 1) Find a Doctor and Pay Out of Pocket Although you won't have to find out who is covered by your insurance plan, it is a good idea to ask around and get recommendations. You will then need to call the office and see if the doctor you have chosen will accept you as a new patient and what types of options they offer for patients who are self-pay. Some doctors offer discounts or will set up payment plans for their patients who do not have insurance, but you will need to ask so you aren't surprised when you get to your appointment.  2) Contact Your Local Health Department Not all health departments have doctors that can see patients for sick visits, but many do, so it is worth a call to see if yours does. If you don't know where your local health department is, you can check in your phone book. The CDC also has a tool to help you locate your state's health department, and many state websites also have listings of all of their local health departments.  3) Find a Walk-in Clinic If your illness is not likely to be very severe or complicated, you may want to try a walk in clinic. These are popping up all over the country in pharmacies, drugstores, and shopping centers. They're usually staffed by nurse practitioners or physician assistants that have been trained to treat common illnesses and complaints. They're usually fairly quick and inexpensive. However, if you have serious medical issues or chronic medical problems, these  are probably not your best option.  No Primary Care Doctor: - Call Health Connect at  [phone] - they can help you locate a primary care doctor that  accepts your insurance, provides certain services, etc. - Physician Referral Service- [phone]  Chronic Pain Problems: Organization         Address  Phone   Notes  [facility]  [phone] Patients need to be referred by their primary care doctor.    Medication Assistance: Organization         Address  Phone   Notes  [facility] Medication [facility] [address] [place] [place], [place] [address] [phone] --Must be a resident of [facility] -- Must have NO insurance coverage whatsoever (no Medicaid/ Medicare, etc.) -- The pt. MUST have a primary care doctor that directs their care regularly and follows them in the community   [facility]ist  [phone]   [employer]  [phone]    Agencies that provide inexpensive medical care: Organization         Address  Phone   Notes  [facility]  [phone]   [facility]    [phone]   [facility] [address] [place], [place] [address] [phone]   [facility] [address] [place] [place] [phone]   Planned Parenthood    [phone]   [facility]    [phone]   Community [facility] and [facility]  [address], [place] Phone:  [phone], Fax:  [phone] Hours of Operation:  9 am - 6 pm, M-F.  Also accepts Medicaid/Medicare and self-pay.  [facility]  [address], [place] Phone: [phone], Fax: [phone]. Hours of Operation:  8:30 am - 5:30 pm, M-F.  Also accepts Medicaid and self-pay.  [facility] [place] [address] [place] Phone: [phone]   [facility] [address] Natasha Bence [place], [place] [phone], Ext. [phone] Mondays & Thursdays: 7-9 AM.  First 15 patients are seen on a first come, first serve basis.    Medicaid-accepting [facility] Providers:  Organization         Address  Phone   Notes  [facility] [address], [place] [phone] Also accepts self-pay patients.  [facility] [address] Laurell Josephs [place], [place]  [phone]   [facility] [address], [place] [phone]   [facility] [address], [place] [phone]   Renaye Rakers [address], [place]   [phone] Only accepts [place] patients after they have their name applied to their card.   Self-Pay (no insurance) in [facility]:  Organization         Address  Phone   Notes  Sickle Cell Patients, [facility] [address] [place], [place] [phone]   [facility] [address] [place], [place] [phone]   [facility] [place]  [address], [place] [phone]   [facility]/Dr. Osei-Bonsu  [address] [place] or [address], [place] [phone] Phone number for both [place] and [place] locations is the same.  [facility] [address], [place] [phone]   [facility] [place] [address] [place] [address], [place] or [address] [phone]   [facility] [address], [place] [phone]) [phone], phone; [phone], fax Sees patients 1st and 3rd Saturday of every month.  Must not qualify for public or private insurance (i.e. Medicaid, Medicare, [place] Health Choice, Veterans' Benefits)  Household income should be no more than 200% of the poverty level The clinic cannot treat you if you are pregnant or think you are pregnant  Sexually transmitted diseases are not treated at the clinic.    Dental Care: Organization         Address  Phone  Notes  [facility] [address], [place] [phone] Accepts children up to age 30 who are enrolled in [place]icaid or [place] Health Choice; pregnant women with a Medicaid card; and children who have applied for Medicaid or [place] Health Choice, but were declined, whose parents can pay a reduced fee at time of service.   [facility] [place]  [address], [place] [phone] Accepts children up to age 30 who are enrolled in [place] or [place] Health Choice; pregnant women with a Medicaid card; and children who have applied for Medicaid or [place] Health Choice, but were declined, whose parents can pay a reduced fee at time of service.  [facility]  [address], [place] [phone] Patients are seen by appointment only. Walk-ins are not accepted. [facility] will see patients 30 years of age and older. Monday - Tuesday (8am-5pm) Most Wednesdays (8:30-5pm) $30 per visit, cash only  [facility]  [address], [facility] [phone] Patients are seen by appointment only. Walk-ins are not accepted. [facility] will see patients 30 years of age and older. One Wednesday Evening (Monthly: Volunteer Based).  $30 per visit, cash only  [employer] [facility] [facility]  [phone] for adults; Children under age 424, call Graduate Pediatric Dentistry at [phone]. Children aged 204-14, please call [phone] to request a pediatric application.  Dental services are provided in all areas of dental care including fillings, crowns and bridges, complete and partial dentures, implants, gum treatment, root canals, and extractions. Preventive care is also provided. Treatment is provided to both adults and children. Patients are selected via a lottery and there is often a waiting list.   [facility] [address], [place]  [phone] [email or web address]   [facility] [address], [place], [place] [phone], Ext. [phone] Second and Fourth Thursday of each month, opens at 6:30 AM; Clinic ends at 9 AM.  Patients are seen on a first-come first-served basis, and a limited number are seen during each clinic.   [facility]  [address], [place], [place] [phone]  [phone]   Eligibility Requirements You must have lived in [place], [place], or [place] counties for at least the last three months.   You cannot be eligible for state or federal sponsored National Cityhealthcare insurance, including CIGNAVeterans Administration, [place]icaid, or [employer]Medicare.   You generally cannot be eligible for healthcare insurance through your employer.    How to apply: Eligibility screenings are held every Tuesday and Wednesday afternoon from 1:00 pm until 4:00 pm. You do not need an appointment for the interview!  [facility] [address], [place], [place] [phone]   [facility]  [phone]   [facility]  [phone]   [facility]  [phone]    Behavioral Health Resources in the Community: Intensive Outpatient Programs Organization         Address  Phone  Notes  [facility] [address], [place], [place] [phone]   [facility] [address], [place], [place] [phone]   [facility] [address], [place], [place]  [phone]   [facility] [address],  [place], [place] [phone] or [phone]   Substance Abuse Resources Organization         Address  Phone  Notes  [facility]  [phone]   [facility]  [phone]   [facility]  [phone]   [facility]  [phone]   Residential & Outpatient Substance Abuse Program  [phone]   Psychological Services Organization         Address  Phone  Notes  [facility]  [phone]- [phone]   [facility]  [phone]- [phone]   [facility] [address], [place] [phone] or [phone]    Mobile Crisis Teams Organization         Address  Phone  Notes  [facility], Mobile Crisis Care Unit  [phone]   [facility]  [address] [place], [place] [phone]   Doristine LocksSharon DeEsch [address] [place] [phone]    Self-Help/Support Groups Organization         Address  Phone             Notes  [facility] of [place] - variety of support groups  [phone] Call for more information  Narcotics Anonymous (NA), Caring [facility] [address], [place]  2 meetings at this location   Statisticianesidential Treatment Programs Organization         Address  Phone  Notes  [facility] [address],    [place]  [phone]   [facility]  [address], [place] [phone], [place], [place] [phone]   [facility] [address], [place] [phone] Admissions: 8am-3pm M-F  [facility] [address]    [place], [place] [phone]   [facility] [address], [place], [place] [phone]   [facility] [address]  [place], [place] [phone]   [facility] - Intensive Outpatient [address], [place], [place] [phone]   [facility] ([facility] [address]  [place], [place] [phone] or [phone]   [facility] ([facility]) [address] [place], [place] [phone] Accepts Medicaid  [facility] [address]  [place] [phone] Substance Abuse/Addiction Treatment   [facility] Health Resources Organization         Address  Phone  Notes  [facility]  [phone]   Angie FavaJulie Brannon, PhD [address], [address] [place], [place]   [phone] or [phone]   [facility]   [address] [place], [place] [phone]   [facility] [address], [place], [place] [phone] Insurance/Medicaid/sponsorship through [facility]  Faith and Families [address]  [place], [place] [phone]  [place], [place] [phone]    Dr. Adele Schilder  [phone]   [facility] Dept. 1) [address], [place] 2) [place] 3)  [place] [address], [place] [phone]  [phone]   [place] [phone] or [phone] (After Hours)

## 2013-07-25 NOTE — ED Provider Notes (Signed)
CSN: [id number]     Arrival date & time 07/25/13  1552 History   First MD Initiated Contact with Patient 07/25/13 1647     Chief Complaint  Patient presents with  . Emesis     (Consider location/radiation/quality/duration/timing/severity/associated sxs/prior Treatment) The history is provided by the patient and medical records. No language interpreter was used.    Dominique Webb is a 28 y.o. female  with no major medical history presents to the Emergency Department complaining of gradual, persistent, progressively worsening nausea and vomiting onset 10 AM. Associated symptoms include mild generalized abdominal pain described as cramping and improving after emesis.  Pt reports 2 episodes of NBNB emesis without diarrhea.  No aggravating or alleviating factors and no treatment PTA.  Pt denies fever, chills, headache, neck pain, chest pain, shortness of breath, diarrhea, weakness, dizziness, syncope, dysuria, hematuria.  LMP: unknown - pt on depo.  Patient does endorse symptoms of reflux, particularly at night. She reports never taking any medications for this.   History reviewed. No pertinent past medical history. History reviewed. No pertinent past surgical history. History reviewed. No pertinent family history. History  Substance Use Topics  . Smoking status: Current Every Day Smoker -- 0.50 packs/day    Types: Cigarettes  . Smokeless tobacco: Never Used  . Alcohol Use: Yes     Comment: Vodka ocassionally   OB History   Grav Para Term Preterm Abortions TAB SAB Ect Mult Living   0              Review of Systems  Constitutional: Negative for fever, diaphoresis, appetite change, fatigue and unexpected weight change.  HENT: Negative for mouth sores.   Eyes: Negative for visual disturbance.  Respiratory: Negative for cough, chest tightness, shortness of breath and wheezing.   Cardiovascular: Negative for chest pain.  Gastrointestinal: Positive for nausea, vomiting and abdominal  pain. Negative for diarrhea and constipation.  Endocrine: Negative for polydipsia, polyphagia and polyuria.  Genitourinary: Negative for dysuria, urgency, frequency and hematuria.  Musculoskeletal: Negative for back pain and neck stiffness.  Skin: Negative for rash.  Allergic/Immunologic: Negative for immunocompromised state.  Neurological: Negative for syncope, light-headedness and headaches.  Hematological: Does not bruise/bleed easily.  Psychiatric/Behavioral: Negative for sleep disturbance. The patient is not nervous/anxious.       Allergies  Aspirin  Home Medications   Prior to Admission medications   Medication Sig Start Date End Date Taking? Authorizing Provider  medroxyPROGESTERone (DEPO-PROVERA) 150 MG/ML injection Inject 150 mg into the muscle every 3 (three) months.   Yes Historical Provider, MD  omeprazole (PRILOSEC) 20 MG capsule Take 1 capsule (20 mg total) by mouth daily. 07/25/13   Cressida Milford, PA-C  ondansetron (ZOFRAN ODT) 4 MG disintegrating tablet 4mg  ODT q4 hours prn nausea/vomit 07/25/13   Mikail Goostree, PA-C   BP 122/65  Pulse 61  Temp(Src) 98.6 F (37 C) (Oral)  Resp 15  SpO2 100% Physical Exam  Nursing note and vitals reviewed. Constitutional: She is oriented to person, place, and time. She appears well-developed and well-nourished. No distress.  Awake, alert, nontoxic appearance  HENT:  Head: Normocephalic and atraumatic.  Mouth/Throat: Oropharynx is clear and moist. No oropharyngeal exudate.  Eyes: Conjunctivae are normal. No scleral icterus.  Neck: Normal range of motion. Neck supple.  Cardiovascular: Normal rate, regular rhythm, normal heart sounds and intact distal pulses.   RRR NO tachycardia  Pulmonary/Chest: Effort normal and breath sounds normal. No respiratory distress. She has no wheezes.  Abdominal: Soft.  Bowel sounds are normal. She exhibits no distension and no mass. There is no tenderness. There is no rebound and no  guarding.  abd soft and notender without peritoneal signs, rebound   Musculoskeletal: Normal range of motion. She exhibits no edema.  Neurological: She is alert and oriented to person, place, and time. She exhibits normal muscle tone. Coordination normal.  Speech is clear and goal oriented Moves extremities without ataxia  Skin: Skin is warm and dry. She is not diaphoretic. No erythema.  Psychiatric: She has a normal mood and affect.    ED Course  Procedures (including critical care time) Labs Review Labs Reviewed  URINALYSIS, ROUTINE W REFLEX MICROSCOPIC - Abnormal; Notable for the following:    Hgb urine dipstick SMALL (*)    All other components within normal limits  CBC WITH DIFFERENTIAL  COMPREHENSIVE METABOLIC PANEL  LIPASE, BLOOD  URINE MICROSCOPIC-ADD ON  POC URINE PREG, ED    Imaging Review No results found.   EKG Interpretation None      MDM   Final diagnoses:  Gastritis   Lucky Rathke Spargo presents with generalized abd soreness and waxing and waning crampy abd pain with 2 episodes of NBNB emesis.  Pt reports this is the same as previous visits with gastritis.  Pt is well appearing.  Will check basic labs and reassess.    7:29 PM Pt feeling much better and requests water.  Will PO trial.  Awaiting UA.  Labs reassuring and no further emesis in the department.  Repeat exam remains soft and nontender.    8:25 PM Patient tolerating by mouth without difficulty. No further emesis. Patient reports complete resolution of symptoms.    Patient is nontoxic, nonseptic appearing, in no apparent distress.  Patient's pain and other symptoms adequately managed in emergency department.  Fluid bolus given.  Labs and vitals reviewed.  Patient does not meet the SIRS or Sepsis criteria.  On repeat exam patient does not have a surgical abdomin and there are nor peritoneal signs.  No indication of appendicitis, bowel obstruction, bowel perforation, cholecystitis, diverticulitis, PID or  ectopic pregnancy.  Patient discharged home with symptomatic treatment.  I have personally reviewed patient's vitals, nursing note and any pertinent labs or imaging.  I performed an undressed physical exam.    At this time, it has been determined that no acute conditions requiring further emergency intervention. The patient/guardian have been advised of the diagnosis and plan. I reviewed all labs and imaging including any potential incidental findings. We have discussed signs and symptoms that warrant return to the ED, such as intractable vomiting, high fevers, bloody stools or emesis.  Patient/guardian has voiced understanding and agreed to follow-up with the PCP or specialist in 3 days.  Vital signs are stable at discharge.   BP 122/65  Pulse 61  Temp(Src) 98.6 F (37 C) (Oral)  Resp 15  SpO2 100%        Dierdre Forth, PA-C 07/25/13 2027

## 2013-07-25 NOTE — ED Notes (Signed)
Pt states that she started having N/V this morning. Denies diarrhea. Some periumbilical abdominal pain. Alert and oriented.

## 2013-11-23 ENCOUNTER — Emergency Department (HOSPITAL_COMMUNITY): Payer: No Typology Code available for payment source

## 2013-11-23 ENCOUNTER — Emergency Department (HOSPITAL_COMMUNITY)
Admission: EM | Admit: 2013-11-23 | Discharge: 2013-11-23 | Disposition: A | Discharge status: Home / Self Care | Payer: No Typology Code available for payment source | Attending: Emergency Medicine | Admitting: Emergency Medicine

## 2013-11-23 ENCOUNTER — Encounter (HOSPITAL_COMMUNITY): Payer: Self-pay

## 2013-11-23 DIAGNOSIS — Z720 Tobacco use: Secondary | ICD-10-CM | POA: Diagnosis not present

## 2013-11-23 DIAGNOSIS — Z79899 Other long term (current) drug therapy: Secondary | ICD-10-CM | POA: Insufficient documentation

## 2013-11-23 DIAGNOSIS — R0789 Other chest pain: Secondary | ICD-10-CM | POA: Diagnosis not present

## 2013-11-23 DIAGNOSIS — R001 Bradycardia, unspecified: Secondary | ICD-10-CM | POA: Insufficient documentation

## 2013-11-23 DIAGNOSIS — R0781 Pleurodynia: Secondary | ICD-10-CM | POA: Diagnosis present

## 2013-11-23 MED ORDER — HYDROCODONE-ACETAMINOPHEN 5-325 MG PO TABS: 1.0000 | ORAL_TABLET | Freq: Four times a day (QID) | ORAL | Status: DC | PRN

## 2013-11-23 MED ORDER — HYDROCODONE-ACETAMINOPHEN 5-325 MG PO TABS
2.0000 | ORAL_TABLET | Freq: Once | ORAL | Status: AC
  Administered 2013-11-23: 2 via ORAL
  Filled 2013-11-23: qty 2

## 2013-11-23 NOTE — Discharge Instructions (Signed)

## 2013-11-23 NOTE — ED Provider Notes (Signed)
CSN: [id number]     Arrival date & time 11/23/13  1335 History   First MD Initiated Contact with Patient 11/23/13 1529     Chief Complaint  Patient presents with  . rib pain      (Consider location/radiation/quality/duration/timing/severity/associated sxs/prior Treatment) HPI Comments: Patient presents with a constant, sharp/cramping left lateral chest wall pain since 1 AM last night. No known illnesses, no recent injuries. No associated shortness of breath, nausea, vomiting, diaphoresis, cough, leg pain or swelling. She's had no recent travel. No history of prior similar symptoms. She has no past medical history and no surgical history.   History reviewed. No pertinent past medical history. History reviewed. No pertinent past surgical history. History reviewed. No pertinent family history. History  Substance Use Topics  . Smoking status: Current Every Day Smoker -- 0.50 packs/day    Types: Cigarettes  . Smokeless tobacco: Never Used  . Alcohol Use: Yes     Comment: Vodka ocassionally   OB History    Gravida Para Term Preterm AB TAB SAB Ectopic Multiple Living   0              Review of Systems  Constitutional: Negative for fever, chills, diaphoresis, activity change, appetite change and fatigue.  HENT: Negative for congestion, facial swelling, rhinorrhea and sore throat.   Eyes: Negative for photophobia and discharge.  Respiratory: Negative for cough, chest tightness and shortness of breath.   Cardiovascular: Negative for chest pain, palpitations and leg swelling.  Gastrointestinal: Negative for nausea, vomiting, abdominal pain and diarrhea.  Endocrine: Negative for polydipsia and polyuria.  Genitourinary: Negative for dysuria, frequency, difficulty urinating and pelvic pain.  Musculoskeletal: Negative for back pain, arthralgias, neck pain and neck stiffness.  Skin: Negative for color change and wound.  Allergic/Immunologic: Negative for immunocompromised state.    Neurological: Negative for facial asymmetry, weakness, numbness and headaches.  Hematological: Does not bruise/bleed easily.  Psychiatric/Behavioral: Negative for confusion and agitation.      Allergies  Aspirin  Home Medications   Prior to Admission medications   Medication Sig Start Date End Date Taking? Authorizing Provider  medroxyPROGESTERone (DEPO-PROVERA) 150 MG/ML injection Inject 150 mg into the muscle every 3 (three) months.   Yes Historical Provider, MD  omeprazole (PRILOSEC) 20 MG capsule Take 1 capsule (20 mg total) by mouth daily. 07/25/13   Hannah Muthersbaugh, PA-C  ondansetron (ZOFRAN ODT) 4 MG disintegrating tablet 4mg  ODT q4 hours prn nausea/vomit 07/25/13   Hannah Muthersbaugh, PA-C   BP 112/68 mmHg  Pulse 54  Temp(Src) 98.2 F (36.8 C) (Oral)  Resp 16  SpO2 100% Physical Exam  Constitutional: She is oriented to person, place, and time. She appears well-developed and well-nourished. No distress.  HENT:  Head: Normocephalic and atraumatic.  Mouth/Throat: No oropharyngeal exudate.  Eyes: Pupils are equal, round, and reactive to light.  Neck: Normal range of motion. Neck supple.  Cardiovascular: Normal rate, regular rhythm and normal heart sounds.  Exam reveals no gallop and no friction rub.   No murmur heard. Pulmonary/Chest: Effort normal and breath sounds normal. No respiratory distress. She has no wheezes. She has no rales.  Abdominal: Soft. Bowel sounds are normal. She exhibits no distension and no mass. There is no tenderness. There is no rebound and no guarding.  Musculoskeletal: Normal range of motion. She exhibits no edema or tenderness.       Arms: Reproducible Left lateral chest wall pain without overlying skin skin changes  Neurological: She is alert and oriented  to person, place, and time.  Skin: Skin is warm and dry.  Psychiatric: She has a normal mood and affect.    ED Course  Procedures (including critical care time) Labs Review Labs  Reviewed  URINALYSIS, ROUTINE W REFLEX MICROSCOPIC  POC URINE PREG, ED  POC URINE PREG, ED    Imaging Review No results found.   EKG Interpretation None      MDM   Final diagnoses:  Left-sided chest wall pain    Pt is a [age] y.o. female with Pmhx as above who presents with left lateral chest wall pain since 1 AM last night. Pain is worse with sitting up, and palpation. It is better when lying down. She's had no falls or injuries. She has no associated anterior chest pain, shortness of breath, nausea, vomiting, diaphoresis, leg pain or swelling. She's also had no recent illness or injuries. On physical exam he is mildly bradycardic at 52, vital signs otherwise stable and she is in no acute distress. Cardiopulmonary exam is benign other than a reproducible left lateral chest wall pain. Patient is not made worse by deep breathing. No lower extremity pain or edema. Chest x-ray negative. EKG w/o acute ischemic changes. Norco given for pain with good results.I suspect pain is musculoskeletal.  I doubt cardiogenic cause of pain.  I feel she is safe to be discharged home with a short course of Norco alternating with NSAIDs.  Return precautions given for new or worsening symptoms including worsening pain, short of breath, fever, leg pain or swelling.        Toy CookeyMegan Docherty, MD 11/24/13 [phone]

## 2013-11-23 NOTE — ED Notes (Signed)
Pt states having left side pain prior to going to bed last night.  Pain from axilla down side.  Denies pain to back and pain with urination.  No fever.  No cough/congestion

## 2013-11-23 NOTE — ED Notes (Signed)
Pt is aware of the need for urine sample, however cannot provide one at this time. 

## 2014-02-26 ENCOUNTER — Encounter (HOSPITAL_COMMUNITY): Payer: Self-pay | Admitting: Emergency Medicine

## 2014-02-26 ENCOUNTER — Emergency Department (HOSPITAL_COMMUNITY)
Admission: EM | Admit: 2014-02-26 | Discharge: 2014-02-26 | Disposition: A | Discharge status: Home / Self Care | Payer: No Typology Code available for payment source | Attending: Emergency Medicine | Admitting: Emergency Medicine

## 2014-02-26 DIAGNOSIS — Z3202 Encounter for pregnancy test, result negative: Secondary | ICD-10-CM | POA: Insufficient documentation

## 2014-02-26 DIAGNOSIS — Z720 Tobacco use: Secondary | ICD-10-CM | POA: Insufficient documentation

## 2014-02-26 DIAGNOSIS — N898 Other specified noninflammatory disorders of vagina: Secondary | ICD-10-CM | POA: Diagnosis present

## 2014-02-26 DIAGNOSIS — N760 Acute vaginitis: Secondary | ICD-10-CM | POA: Insufficient documentation

## 2014-02-26 DIAGNOSIS — B9689 Other specified bacterial agents as the cause of diseases classified elsewhere: Secondary | ICD-10-CM

## 2014-02-26 LAB — POC URINE PREG, ED: Preg Test, Ur: NEGATIVE

## 2014-02-26 LAB — WET PREP, GENITAL
Trich, Wet Prep: NONE SEEN
Yeast Wet Prep HPF POC: NONE SEEN

## 2014-02-26 MED ORDER — METRONIDAZOLE 500 MG PO TABS: 500.0000 mg | ORAL_TABLET | Freq: Two times a day (BID) | ORAL | Status: DC

## 2014-02-26 NOTE — ED Notes (Signed)
Pt reports- history of yeast infections, reports this feels similar. Odor noted to vaginal discharge as well as itching.

## 2014-02-26 NOTE — ED Provider Notes (Signed)
CSN: [id number]     Arrival date & time 02/26/14  0940 History   First MD Initiated Contact with Patient 02/26/14 [phone]     Chief Complaint  Patient presents with  . Vaginal Itching  . Vaginal Discharge     (Consider location/radiation/quality/duration/timing/severity/associated sxs/prior Treatment) HPI  Pt is a 29yo female presenting to ED with c/o malodorous vaginal discharge that started 2-3 days ago with associated vaginal itching and mild lower abdominal cramping.  Reports hx of yeast infections and states this feels similar. She believes a fragrance soap her boyfriend got her the other day possibly triggered these symptoms. Small concern for STDs. Pt has an implanted birth control in left arm.  States she no longer has a menstrual cycle. Denies urinary symptoms.   History reviewed. No pertinent past medical history. History reviewed. No pertinent past surgical history. History reviewed. No pertinent family history. History  Substance Use Topics  . Smoking status: Current Every Day Smoker -- 0.50 packs/day    Types: Cigarettes  . Smokeless tobacco: Never Used  . Alcohol Use: Yes     Comment: Vodka ocassionally   OB History    Gravida Para Term Preterm AB TAB SAB Ectopic Multiple Living   0              Review of Systems  Constitutional: Negative for fever and chills.  Gastrointestinal: Positive for nausea and abdominal pain ( cramping). Negative for vomiting, diarrhea and constipation.  Genitourinary: Positive for vaginal discharge and pelvic pain ( cramping). Negative for dysuria, frequency, hematuria, flank pain, decreased urine volume, vaginal bleeding and vaginal pain.  All other systems reviewed and are negative.     Allergies  Aspirin  Home Medications   Prior to Admission medications   Medication Sig Start Date End Date Taking? Authorizing Provider  etonogestrel (NEXPLANON) 68 MG IMPL implant 1 each by Subdermal route once.   Yes Historical Provider, MD  OVER  THE COUNTER MEDICATION Take 2 tablets by mouth daily. Hair infinity   Yes Historical Provider, MD  HYDROcodone-acetaminophen (NORCO) 5-325 MG per tablet Take 1 tablet by mouth every 6 (six) hours as needed. Patient not taking: Reported on 02/26/2014 11/23/13   Toy Cookey, MD  metroNIDAZOLE (FLAGYL) 500 MG tablet Take 1 tablet (500 mg total) by mouth 2 (two) times daily. One po bid x 7 days 02/26/14   Junius Finner, PA-C  omeprazole (PRILOSEC) 20 MG capsule Take 1 capsule (20 mg total) by mouth daily. Patient not taking: Reported on 02/26/2014 07/25/13   Dahlia Client Muthersbaugh, PA-C  ondansetron (ZOFRAN ODT) 4 MG disintegrating tablet  ODT q4 hours prn nausea/vomit Patient not taking: Reported on 02/26/2014 07/25/13   Dahlia Client Muthersbaugh, PA-C   BP 103/58 mmHg  Pulse 72  Temp(Src) 98 F (36.7 C) (Oral)  Resp 18  Ht  (1.575 m)  Wt 131 lb (59.421 kg)  BMI 23.95 kg/m2  SpO2 100% Physical Exam  Constitutional: She appears well-developed and well-nourished. No distress.  HENT:  Head: Normocephalic and atraumatic.  Eyes: Conjunctivae are normal. No scleral icterus.  Neck: Normal range of motion.  Cardiovascular: Normal rate, regular rhythm and normal heart sounds.   Pulmonary/Chest: Effort normal and breath sounds normal. No respiratory distress. She has no wheezes. She has no rales. She exhibits no tenderness.  Abdominal: Soft. Bowel sounds are normal. She exhibits no distension and no mass. There is no tenderness. There is no rebound and no guarding.  Genitourinary:  Chaperoned exam. Normal external genitalia.  Vaginal canal: small amount white-clear discharge. No vaginal bleeding. No CMT, adnexal tenderness or masses  Musculoskeletal: Normal range of motion.  Neurological: She is alert.  Skin: Skin is warm and dry. She is not diaphoretic.  Nursing note and vitals reviewed.   ED Course  Procedures (including critical care time) Labs Review Labs Reviewed  WET PREP, GENITAL -  Abnormal; Notable for the following:    Clue Cells Wet Prep HPF POC FEW (*)    WBC, Wet Prep HPF POC MODERATE (*)    All other components within normal limits  POC URINE PREG, ED  GC/CHLAMYDIA PROBE AMP ([place])    Imaging Review No results found.   EKG Interpretation None      MDM   Final diagnoses:  Bacterial vaginosis    Pt presenting to ED with concern for yeast infection. Negative urine preg. Pt is afebrile. Non-toxic appearing. NAD. Not concerned for ectopic preg, TOA, ovarian torsion. No urinary symptoms. Wet prep: few clue cells, pt is symptomatic. Will tx for BV with flagyl.  GC/Chlamydia tests pending.  Home care instructions provided. Return precautions provided. Advised to f/u with PCP and/or women's clinic for recheck of symptoms if not improving in 1 week.  Return precautions provided. Pt verbalized understanding and agreement with tx plan.    Junius Finnerrin O'Malley, PA-C 02/26/14 1059  Raeford RazorStephen Kohut, MD 03/03/14 1012

## 2014-02-28 LAB — GC/CHLAMYDIA PROBE AMP (~~LOC~~) NOT AT ARMC
Chlamydia: NEGATIVE
Neisseria Gonorrhea: NEGATIVE

## 2014-05-09 ENCOUNTER — Emergency Department (HOSPITAL_COMMUNITY)
Admission: EM | Admit: 2014-05-09 | Discharge: 2014-05-09 | Disposition: A | Discharge status: Home / Self Care | Payer: No Typology Code available for payment source | Attending: Emergency Medicine | Admitting: Emergency Medicine

## 2014-05-09 ENCOUNTER — Encounter (HOSPITAL_COMMUNITY): Payer: Self-pay | Admitting: Emergency Medicine

## 2014-05-09 DIAGNOSIS — N390 Urinary tract infection, site not specified: Secondary | ICD-10-CM

## 2014-05-09 DIAGNOSIS — Z3202 Encounter for pregnancy test, result negative: Secondary | ICD-10-CM | POA: Insufficient documentation

## 2014-05-09 DIAGNOSIS — Z202 Contact with and (suspected) exposure to infections with a predominantly sexual mode of transmission: Secondary | ICD-10-CM | POA: Diagnosis not present

## 2014-05-09 DIAGNOSIS — R300 Dysuria: Secondary | ICD-10-CM | POA: Diagnosis present

## 2014-05-09 DIAGNOSIS — Z720 Tobacco use: Secondary | ICD-10-CM | POA: Insufficient documentation

## 2014-05-09 DIAGNOSIS — Z792 Long term (current) use of antibiotics: Secondary | ICD-10-CM | POA: Diagnosis not present

## 2014-05-09 DIAGNOSIS — Z79899 Other long term (current) drug therapy: Secondary | ICD-10-CM | POA: Diagnosis not present

## 2014-05-09 LAB — URINE MICROSCOPIC-ADD ON

## 2014-05-09 LAB — CBC WITH DIFFERENTIAL/PLATELET
BASOS ABS: 0 10*3/uL (ref 0.0–0.1)
Basophils Relative: 0 % (ref 0–1)
EOS PCT: 1 % (ref 0–5)
Eosinophils Absolute: 0.1 10*3/uL (ref 0.0–0.7)
HCT: 39.7 % (ref 36.0–46.0)
HEMOGLOBIN: 13.5 g/dL (ref 12.0–15.0)
LYMPHS ABS: 2.1 10*3/uL (ref 0.7–4.0)
Lymphocytes Relative: 24 % (ref 12–46)
MCH: 32.9 pg (ref 26.0–34.0)
MCHC: 34 g/dL (ref 30.0–36.0)
MCV: 96.8 fL (ref 78.0–100.0)
MONOS PCT: 6 % (ref 3–12)
Monocytes Absolute: 0.5 10*3/uL (ref 0.1–1.0)
NEUTROS ABS: 6.2 10*3/uL (ref 1.7–7.7)
Neutrophils Relative %: 69 % (ref 43–77)
PLATELETS: 178 10*3/uL (ref 150–400)
RBC: 4.1 MIL/uL (ref 3.87–5.11)
RDW: 12.8 % (ref 11.5–15.5)
WBC: 8.9 10*3/uL (ref 4.0–10.5)

## 2014-05-09 LAB — COMPREHENSIVE METABOLIC PANEL
ALT: 15 U/L (ref 14–54)
AST: 18 U/L (ref 15–41)
Albumin: 4.8 g/dL (ref 3.5–5.0)
Alkaline Phosphatase: 59 U/L (ref 38–126)
Anion gap: 7 (ref 5–15)
BUN: 12 mg/dL (ref 6–20)
CO2: 25 mmol/L (ref 22–32)
Calcium: 9.5 mg/dL (ref 8.9–10.3)
Chloride: 108 mmol/L (ref 101–111)
Creatinine, Ser: 0.91 mg/dL (ref 0.44–1.00)
GLUCOSE: 93 mg/dL (ref 70–99)
Potassium: 4 mmol/L (ref 3.5–5.1)
SODIUM: 140 mmol/L (ref 135–145)
Total Bilirubin: 0.8 mg/dL (ref 0.3–1.2)
Total Protein: 7.4 g/dL (ref 6.5–8.1)

## 2014-05-09 LAB — POC URINE PREG, ED: Preg Test, Ur: NEGATIVE

## 2014-05-09 LAB — URINALYSIS, ROUTINE W REFLEX MICROSCOPIC
BILIRUBIN URINE: NEGATIVE
GLUCOSE, UA: NEGATIVE mg/dL
KETONES UR: NEGATIVE mg/dL
Nitrite: NEGATIVE
Protein, ur: NEGATIVE mg/dL
SPECIFIC GRAVITY, URINE: 1.029 (ref 1.005–1.030)
UROBILINOGEN UA: 0.2 mg/dL (ref 0.0–1.0)
pH: 6 (ref 5.0–8.0)

## 2014-05-09 LAB — WET PREP, GENITAL
Clue Cells Wet Prep HPF POC: NONE SEEN
TRICH WET PREP: NONE SEEN
YEAST WET PREP: NONE SEEN

## 2014-05-09 MED ORDER — CEPHALEXIN 500 MG PO CAPS: 1000.0000 mg | ORAL_CAPSULE | Freq: Two times a day (BID) | ORAL | Status: DC

## 2014-05-09 MED ORDER — CEFTRIAXONE SODIUM 250 MG IJ SOLR
250.0000 mg | Freq: Once | INTRAMUSCULAR | Status: AC
  Administered 2014-05-09: 250 mg via INTRAMUSCULAR
  Filled 2014-05-09: qty 250

## 2014-05-09 MED ORDER — LIDOCAINE HCL (PF) 1 % IJ SOLN
INTRAMUSCULAR | Status: AC
  Administered 2014-05-09: 0.6 mL via INTRAMUSCULAR
  Filled 2014-05-09: qty 5

## 2014-05-09 MED ORDER — PHENAZOPYRIDINE HCL 200 MG PO TABS: 200.0000 mg | ORAL_TABLET | Freq: Three times a day (TID) | ORAL | Status: DC | PRN

## 2014-05-09 MED ORDER — AZITHROMYCIN 1 G PO PACK
1.0000 g | PACK | Freq: Once | ORAL | Status: AC
  Administered 2014-05-09: 1 g via ORAL
  Filled 2014-05-09: qty 1

## 2014-05-09 NOTE — ED Notes (Signed)
Bed: [id number] Expected date:  Expected time:  Means of arrival:  Comments: Patient dressing

## 2014-05-09 NOTE — ED Notes (Addendum)
Pt c/o lower abdominal pain and dysuria since yesterday. Pt denies flank pain, hematuria. When asked to describe the pain pt sts "It's not a burning, I don't know how to describe it." Pt denies vaginal bleeding or discharge. A&Ox4 and ambulatory. NAD noted. Pt denies N/V/D and fever.

## 2014-05-09 NOTE — Discharge Instructions (Signed)
Urinary Tract Infection Urinary tract infections (UTIs) can develop anywhere along your urinary tract. Your urinary tract is your body's drainage system for removing wastes and extra water. Your urinary tract includes two kidneys, two ureters, a bladder, and a urethra. Your kidneys are a pair of bean-shaped organs. Each kidney is about the size of your fist. They are located below your ribs, one on each side of your spine. CAUSES Infections are caused by microbes, which are microscopic organisms, including fungi, viruses, and bacteria. These organisms are so small that they can only be seen through a microscope. Bacteria are the microbes that most commonly cause UTIs. SYMPTOMS  Symptoms of UTIs may vary by age and gender of the patient and by the location of the infection. Symptoms in young women typically include a frequent and intense urge to urinate and a painful, burning feeling in the bladder or urethra during urination. Older women and men are more likely to be tired, shaky, and weak and have muscle aches and abdominal pain. A fever may mean the infection is in your kidneys. Other symptoms of a kidney infection include pain in your back or sides below the ribs, nausea, and vomiting. DIAGNOSIS To diagnose a UTI, your caregiver will ask you about your symptoms. Your caregiver also will ask to provide a urine sample. The urine sample will be tested for bacteria and white blood cells. White blood cells are made by your body to help fight infection. TREATMENT  Typically, UTIs can be treated with medication. Because most UTIs are caused by a bacterial infection, they usually can be treated with the use of antibiotics. The choice of antibiotic and length of treatment depend on your symptoms and the type of bacteria causing your infection. HOME CARE INSTRUCTIONS  If you were prescribed antibiotics, take them exactly as your caregiver instructs you. Finish the medication even if you feel better after you  have only taken some of the medication.  Drink enough water and fluids to keep your urine clear or pale yellow.  Avoid caffeine, tea, and carbonated beverages. They tend to irritate your bladder.  Empty your bladder often. Avoid holding urine for long periods of time.  Empty your bladder before and after sexual intercourse.  After a bowel movement, women should cleanse from front to back. Use each tissue only once. SEEK MEDICAL CARE IF:   You have back pain.  You develop a fever.  Your symptoms do not begin to resolve within 3 days. SEEK IMMEDIATE MEDICAL CARE IF:   You have severe back pain or lower abdominal pain.  You develop chills.  You have nausea or vomiting.  You have continued burning or discomfort with urination. MAKE SURE YOU:   Understand these instructions.  Will watch your condition.  Will get help right away if you are not doing well or get worse. Document Released: 10/03/2004 Document Revised: 06/25/2011 Document Reviewed: 02/01/2011 [facility] Patient Information 2015 [place], [place]. This information is not intended to replace advice given to you by your health care provider. Make sure you discuss any questions you have with your health care provider. Possible exposure to Sexually Transmitted Disease A sexually transmitted disease (STD) is a disease or infection that may be passed (transmitted) from person to person, usually during sexual activity. This may happen by way of saliva, semen, blood, vaginal mucus, or urine. Common STDs include:   Gonorrhea.   Chlamydia.   Syphilis.   HIV and AIDS.   Genital herpes.   Hepatitis B  and C.   Trichomonas.   Human papillomavirus (HPV).   Pubic lice.   Scabies.  Mites.  Bacterial vaginosis. WHAT ARE CAUSES OF STDs? An STD may be caused by bacteria, a virus, or parasites. STDs are often transmitted during sexual activity if one person is infected. However, they may also be transmitted  through nonsexual means. STDs may be transmitted after:   Sexual intercourse with an infected person.   Sharing sex toys with an infected person.   Sharing needles with an infected person or using unclean piercing or tattoo needles.  Having intimate contact with the genitals, mouth, or rectal areas of an infected person.   Exposure to infected fluids during birth. WHAT ARE THE SIGNS AND SYMPTOMS OF STDs? Different STDs have different symptoms. Some people may not have any symptoms. If symptoms are present, they may include:   Painful or bloody urination.   Pain in the pelvis, abdomen, vagina, anus, throat, or eyes.   A skin rash, itching, or irritation.  Growths, ulcerations, blisters, or sores in the genital and anal areas.  Abnormal vaginal discharge with or without bad odor.   Penile discharge in men.   Fever.   Pain or bleeding during sexual intercourse.   Swollen glands in the groin area.   Yellow skin and eyes (jaundice). This is seen with hepatitis.   Swollen testicles.  Infertility.  Sores and blisters in the mouth. HOW ARE STDs DIAGNOSED? To make a diagnosis, your health care provider may:   Take a medical history.   Perform a physical exam.   Take a sample of any discharge to examine.  Swab the throat, cervix, opening to the penis, rectum, or vagina for testing.  Test a sample of your first morning urine.   Perform blood tests.   Perform a Pap test, if this applies.   Perform a colposcopy.   Perform a laparoscopy.  HOW ARE STDs TREATED? Treatment depends on the STD. Some STDs may be treated but not cured.   Chlamydia, gonorrhea, trichomonas, and syphilis can be cured with antibiotic medicine.   Genital herpes, hepatitis, and HIV can be treated, but not cured, with prescribed medicines. The medicines lessen symptoms.   Genital warts from HPV can be treated with medicine or by freezing, burning (electrocautery), or  surgery. Warts may come back.   HPV cannot be cured with medicine or surgery. However, abnormal areas may be removed from the cervix, vagina, or vulva.   If your diagnosis is confirmed, your recent sexual partners need treatment. This is true even if they are symptom-free or have a negative culture or evaluation. They should not have sex until their health care providers say it is okay. HOW CAN I REDUCE MY RISK OF GETTING AN STD? Take these steps to reduce your risk of getting an STD:  Use latex condoms, dental dams, and water-soluble lubricants during sexual activity. Do not use petroleum jelly or oils.  Avoid having multiple sex partners.  Do not have sex with someone who has other sex partners.  Do not have sex with anyone you do not know or who is at high risk for an STD.  Avoid risky sex practices that can break your skin.  Do not have sex if you have open sores on your mouth or skin.  Avoid drinking too much alcohol or taking illegal drugs. Alcohol and drugs can affect your judgment and put you in a vulnerable position.  Avoid engaging in oral and anal sex acts.  Get vaccinated for HPV and hepatitis. If you have not received these vaccines in the past, talk to your health care provider about whether one or both might be right for you.   If you are at risk of being infected with HIV, it is recommended that you take a prescription medicine daily to prevent HIV infection. This is called pre-exposure prophylaxis (PrEP). You are considered at risk if:  You are a man who has sex with other men (MSM).  You are a heterosexual man or woman and are sexually active with more than one partner.  You take drugs by injection.  You are sexually active with a partner who has HIV.  Talk with your health care provider about whether you are at high risk of being infected with HIV. If you choose to begin PrEP, you should first be tested for HIV. You should then be tested every 3 months for as  long as you are taking PrEP.  WHAT SHOULD I DO IF I THINK I HAVE AN STD?  See your health care provider.   Tell your sexual partner(s). They should be tested and treated for any STDs.  Do not have sex until your health care provider says it is okay. WHEN SHOULD I GET IMMEDIATE MEDICAL CARE? Contact your health care provider right away if:   You have severe abdominal pain.  You are a man and notice swelling or pain in your testicles.  You are a woman and notice swelling or pain in your vagina. Document Released: 03/16/2002 Document Revised: 12/29/2012 Document Reviewed: 07/14/2012 [facility] Patient Information 2015 [place], [place]. This information is not intended to replace advice given to you by your health care provider. Make sure you discuss any questions you have with your health care provider.  Emergency Department Resource Guide 1) Find a Doctor and Pay Out of Pocket Although you won't have to find out who is covered by your insurance plan, it is a good idea to ask around and get recommendations. You will then need to call the office and see if the doctor you have chosen will accept you as a new patient and what types of options they offer for patients who are self-pay. Some doctors offer discounts or will set up payment plans for their patients who do not have insurance, but you will need to ask so you aren't surprised when you get to your appointment.  2) Contact Your Local Health Department Not all health departments have doctors that can see patients for sick visits, but many do, so it is worth a call to see if yours does. If you don't know where your local health department is, you can check in your phone book. The CDC also has a tool to help you locate your state's health department, and many state websites also have listings of all of their local health departments.  3) Find a Walk-in Clinic If your illness is not likely to be very severe or complicated, you may want to try  a walk in clinic. These are popping up all over the country in pharmacies, drugstores, and shopping centers. They're usually staffed by nurse practitioners or physician assistants that have been trained to treat common illnesses and complaints. They're usually fairly quick and inexpensive. However, if you have serious medical issues or chronic medical problems, these are probably not your best option.  No Primary Care Doctor: - Call Health Connect at  [phone] - they can help you locate a primary care doctor that  accepts your insurance, provides certain services, etc. - Physician Referral Service- [phone]  Chronic Pain Problems: Organization         Address  Phone   Notes  [facility]  [phone] Patients need to be referred by their primary care doctor.   Medication Assistance: Organization         Address  Phone   Notes  [facility] Medication [facility] [address] [place], [place] [address] [phone] --Must be a resident of [facility] -- Must have NO insurance coverage whatsoever (no Medicaid/ Medicare, etc.) -- The pt. MUST have a primary care doctor that directs their care regularly and follows them in the community   [facility]ist  [phone]   [employer]  [phone]    Agencies that provide inexpensive medical care: Organization         Address  Phone   Notes  [facility]  [phone]   [facility]    [phone]   [facility] [address] [place], [place] [address] [phone]   [facility] [address], [place] [phone]   Planned Parenthood    [phone]   [facility]    [phone]   [facility]  [address], [place] Phone:  [phone], Fax:  [phone] Hours of Operation:  9 am - 6 pm, M-F.  Also accepts Medicaid/Medicare and self-pay.  [facility]  [address], [place] Phone: [phone], Fax: [phone]. Hours of Operation:  8:30 am - 5:30 pm, M-F.  Also accepts Medicaid and self-pay.  [facility] [place] [address] [place] Phone: [phone]   [facility] [address] Natasha Bence [place], [place] [phone], Ext. [phone] Mondays & Thursdays: 7-9 AM.  First 15 patients are seen on a first come, first serve basis.    Medicaid-accepting [facility] Providers:  Organization         Address  Phone   Notes  [facility] [address], [place] [phone] Also accepts self-pay patients.  [facility] [address] Laurell Josephs [place], [place]  [phone]   [facility] [address], [place] [phone]   [facility] [address], [place] [phone]   Renaye Rakers [address], [place]   [phone] Only accepts [place] patients after they have their name applied to their card.   Self-Pay (no insurance) in [facility]:  Organization         Address  Phone   Notes  Sickle Cell Patients, [facility] [address] [place], [place] [phone]   [facility] [address] [place], [place] [phone]   [facility] [place]  [address],  [phone]   [facility]/Dr. Osei-Bonsu  [address], [place] or [address], [place] [phone] Phone number for both [place] and [place] locations is the same.  [facility] [address] [place] [phone]   [facility] [address], [place] or [address] [phone]   [facility]  [address] [place] [phone], phone; [phone], fax  Sees patients 1st and 3rd Saturday of every month.  Must not qualify for public or private insurance (i.e. Medicaid, Medicare, [place] Health Choice, Veterans' Benefits)  Household income should be no more than 200% of the poverty level The clinic cannot treat you if you are pregnant or think you are pregnant  Sexually transmitted diseases are not treated at the clinic.    Dental Care: Organization         Address  Phone  Notes  [facility] Department of [facility] [address] [place], [place] [phone] Accepts children up to age 16 who are enrolled in [place] or [place] Health Choice; pregnant women with a Medicaid card; and children who have applied for Medicaid or [place] Health Choice, but were declined, whose parents can pay a reduced fee at time of service.  [facility] Department of [facility]  [address], [place] [phone] Accepts children up to age 65 who are enrolled in [place] or [place] Health Choice; pregnant women with a Medicaid card; and children who have applied for Medicaid or [place] Health Choice, but were declined, whose parents can pay a reduced fee at time of service.  [facility]  [address] [place], [place] [phone] Patients are seen by appointment only. Walk-ins are not accepted. [facility] will see patients 10 years of age and older. Monday - Tuesday (8am-5pm) Most Wednesdays (8:30-5pm) $30 per visit, cash only  [facility]  [address], [facility] [phone] Patients are seen by appointment only. Walk-ins are not accepted. [facility] will see patients 45 years of age and older. One Wednesday Evening (Monthly: Volunteer Based).  $30 per visit, cash only  [employer]  [phone] for adults; Children under age 88, call Graduate Pediatric Dentistry at [phone]. Children aged 57-14, please call [phone] to  request a pediatric application.  Dental services are provided in all areas of dental care including fillings, crowns and bridges, complete and partial dentures, implants, gum treatment, root canals, and extractions. Preventive care is also provided. Treatment is provided to both adults and children. Patients are selected via a lottery and there is often a waiting list.   [facility] [address] [place]  [phone] [email or web address]   [facility] [address] [place], [place] [phone], Ext. [phone] Second and Fourth Thursday of each month, opens at 6:30 AM; Clinic ends at 9 AM.  Patients are seen on a first-come first-served basis, and a limited number are seen during each clinic.   [facility]  [address] Ether Griffins [place], [place] [phone]   Eligibility Requirements You must have lived in [place], [place], or [place] counties for at least the last three months.   You cannot be eligible for state or federal sponsored [place], including [employer], [place], or [employer].   You generally cannot be eligible for healthcare insurance through your employer.    How to apply: Eligibility screenings are held every Tuesday and Wednesday afternoon from 1:00 pm until 4:00 pm. You do not need an appointment for the interview!  [facility] [address] [place], [place] [phone]   [facility] Health Department  [phone]   [facility] Health Department  [phone]   [facility] Health Department  [phone]  Behavioral Health Resources in the Community: Intensive Outpatient Programs Organization         Address  Phone  Notes  [facility] [address], [place], [place] [phone]   [facility] Outpatient [address], [place], [place] [phone]   [facility] [address], [place], [place]  [phone]   [facility] [address],  [place], [place] [phone] or [phone]   Substance Abuse Resources Organization         Address  Phone  Notes  [facility]  [phone]   [facility]  [phone]   [facility]  [phone]   [facility]  [phone]   Residential & Outpatient Substance Abuse Program  [phone]   Psychological Services Organization         Address  Phone  Notes  [facility]  [phone]- [phone]   [facility]  [phone]- [phone]   [facility] [address], [place] [phone] or [phone]    Mobile Crisis Teams Organization         Address  Phone  Notes  [facility], Mobile Crisis Care Unit  [phone]   [facility]  [address] [place], [place] [phone]   Doristine LocksSharon DeEsch [address] [place] [phone]    Self-Help/Support Groups Organization         Address  Phone             Notes  [facility] [place] - variety of support groups  [phone] Call for more information  Narcotics Anonymous (NA), [facility] [address], [place]  2 meetings at this location   Statisticianesidential Treatment Programs Organization         Address  Phone  Notes  [facility] [address],    [place]  [phone]   [facility]  [address], [place] [phone], [place], [place] [phone]   [facility] [address], [place] [phone] Admissions: 8am-3pm M-F  [facility] [address]    [place], [place] [phone]   [facility] [address], [place], [place] [phone]   [facility] [address]  [place], [place] [phone]   [facility] - Intensive Outpatient [address], [place], [place] [phone]   [facility] ([facility] [address]  [place], [place] [phone] or [phone]   [facility] ([facility]) [address] [place], [place] [phone] Accepts Medicaid  [facility] [address]  [place] [phone] Substance Abuse/Addiction Treatment   [facility] Resources Organization         Address  Phone  Notes  [facility]  [phone]) [phone]   Angie FavaJulie Brannon, PhD [address], [address] [place], [place]   [phone] or [phone]   [facility] [place]   [address] [place], [place] [phone]   [facility] [address], [place], [place] [phone] Insurance/Medicaid/sponsorship through [facility]  [facility] [address]                                    [place], [place] [phone] Therapy/tele-psych/case  [facility] [address]  [place], [place] [phone]    Dr. Adele Schilder  [phone]   [facility] Dept. 1) [address] [place] 2) [place] 3)  [address], [place] [phone]  [phone]   [place] [phone] or [phone] (After Hours)

## 2014-05-09 NOTE — ED Provider Notes (Signed)
CSN: [id number]     Arrival date & time 05/09/14  1205 History   First MD Initiated Contact with Patient 05/09/14 1402     Chief Complaint  Patient presents with  . Dysuria  . Abdominal Pain     (Consider location/radiation/quality/duration/timing/severity/associated sxs/prior Treatment) HPI The patient states she's had burning with urination and lower abdominal pressure since yesterday. She denies fever or flank pain. She has had no nausea or vomiting. She denies vaginal bleeding or discharge. She does state however she wants to get checked for an STD just to make sure. She reports she had one about a year ago and it got completely treated. History reviewed. No pertinent past medical history. History reviewed. No pertinent past surgical history. No family history on file. History  Substance Use Topics  . Smoking status: Current Every Day Smoker -- 0.50 packs/day    Types: Cigarettes  . Smokeless tobacco: Never Used  . Alcohol Use: Yes     Comment: Vodka ocassionally   OB History    Gravida Para Term Preterm AB TAB SAB Ectopic Multiple Living   0              Review of Systems 10 Systems reviewed and are negative for acute change except as noted in the HPI.    Allergies  Aspirin  Home Medications   Prior to Admission medications   Medication Sig Start Date End Date Taking? Authorizing Provider  etonogestrel (NEXPLANON) 68 MG IMPL implant 1 each by Subdermal route once.   Yes Historical Provider, MD  cephALEXin (KEFLEX) 500 MG capsule Take 2 capsules (1,000 mg total) by mouth 2 (two) times daily. 05/09/14   Arby BarretteMarcy Jackelyn Illingworth, MD  HYDROcodone-acetaminophen (NORCO) 5-325 MG per tablet Take 1 tablet by mouth every 6 (six) hours as needed. Patient not taking: Reported on 02/26/2014 11/23/13   Toy CookeyMegan Docherty, MD  metroNIDAZOLE (FLAGYL) 500 MG tablet Take 1 tablet (500 mg total) by mouth 2 (two) times daily. One po bid x 7 days Patient not taking: Reported on 05/09/2014 02/26/14   Junius FinnerErin  O'Malley, PA-C  omeprazole (PRILOSEC) 20 MG capsule Take 1 capsule (20 mg total) by mouth daily. Patient not taking: Reported on 02/26/2014 07/25/13   Dahlia ClientHannah Muthersbaugh, PA-C  ondansetron (ZOFRAN ODT) 4 MG disintegrating tablet 4mg  ODT q4 hours prn nausea/vomit Patient not taking: Reported on 02/26/2014 07/25/13   Dahlia ClientHannah Muthersbaugh, PA-C  phenazopyridine (PYRIDIUM) 200 MG tablet Take 1 tablet (200 mg total) by mouth 3 (three) times daily as needed for pain. 05/09/14   Arby BarretteMarcy Artur Winningham, MD   BP 103/67 mmHg  Pulse 60  Temp(Src) 98 F (36.7 C) (Oral)  Resp 16  SpO2 100% Physical Exam  Constitutional: She is oriented to person, place, and time. She appears well-developed and well-nourished.  HENT:  Head: Normocephalic and atraumatic.  Eyes: EOM are normal. Pupils are equal, round, and reactive to light.  Neck: Neck supple.  Cardiovascular: Normal rate, regular rhythm, normal heart sounds and intact distal pulses.   Pulmonary/Chest: Effort normal and breath sounds normal.  Abdominal: Soft. Bowel sounds are normal. She exhibits no distension. There is no tenderness.  Genitourinary:  Normal external female genitalia. Speculum examination mild amount of whitish discharge in the vaginal vault. Cervix is with no active drainage or discharge. Mild friability with specimen collection. Nontender to cervical motion uterus and adnexa are nontender.  Musculoskeletal: Normal range of motion. She exhibits no edema.  Neurological: She is alert and oriented to person, place, and time.  She has normal strength. Coordination normal. GCS eye subscore is 4. GCS verbal subscore is 5. GCS motor subscore is 6.  Skin: Skin is warm, dry and intact.  Psychiatric: She has a normal mood and affect.    ED Course  Procedures (including critical care time) Labs Review Labs Reviewed  WET PREP, GENITAL - Abnormal; Notable for the following:    WBC, Wet Prep HPF POC MANY (*)    All other components within normal limits   URINALYSIS, ROUTINE W REFLEX MICROSCOPIC - Abnormal; Notable for the following:    APPearance CLOUDY (*)    Hgb urine dipstick SMALL (*)    Leukocytes, UA MODERATE (*)    All other components within normal limits  URINE CULTURE  CBC WITH DIFFERENTIAL/PLATELET  COMPREHENSIVE METABOLIC PANEL  URINE MICROSCOPIC-ADD ON  HIV ANTIBODY (ROUTINE TESTING)  POC URINE PREG, ED  GC/CHLAMYDIA PROBE AMP ([place])    Imaging Review No results found.   EKG Interpretation None      MDM   Final diagnoses:  UTI (lower urinary tract infection)  STD exposure   Patient has confirmed UTI with typical symptoms. She has concern for STD exposure and will be treated empirically for STD exposure. She is otherwise well in appearance.    Arby Barrette, MD 05/09/14 [phone]

## 2014-05-10 LAB — URINE CULTURE

## 2014-05-10 LAB — HIV ANTIBODY (ROUTINE TESTING W REFLEX): HIV SCREEN 4TH GENERATION: NONREACTIVE

## 2014-05-10 LAB — GC/CHLAMYDIA PROBE AMP (~~LOC~~) NOT AT ARMC
CHLAMYDIA, DNA PROBE: NEGATIVE
Neisseria Gonorrhea: POSITIVE — AB

## 2014-05-11 ENCOUNTER — Telehealth (HOSPITAL_BASED_OUTPATIENT_CLINIC_OR_DEPARTMENT_OTHER): Payer: Self-pay | Admitting: Emergency Medicine

## 2014-06-20 ENCOUNTER — Encounter (HOSPITAL_COMMUNITY): Payer: Self-pay | Admitting: Emergency Medicine

## 2014-06-20 ENCOUNTER — Emergency Department (HOSPITAL_COMMUNITY)
Admission: EM | Admit: 2014-06-20 | Discharge: 2014-06-20 | Disposition: A | Discharge status: Home / Self Care | Payer: No Typology Code available for payment source | Attending: Emergency Medicine | Admitting: Emergency Medicine

## 2014-06-20 DIAGNOSIS — Y9389 Activity, other specified: Secondary | ICD-10-CM | POA: Insufficient documentation

## 2014-06-20 DIAGNOSIS — Z79899 Other long term (current) drug therapy: Secondary | ICD-10-CM | POA: Insufficient documentation

## 2014-06-20 DIAGNOSIS — M546 Pain in thoracic spine: Secondary | ICD-10-CM

## 2014-06-20 DIAGNOSIS — Y92410 Unspecified street and highway as the place of occurrence of the external cause: Secondary | ICD-10-CM | POA: Diagnosis not present

## 2014-06-20 DIAGNOSIS — Y998 Other external cause status: Secondary | ICD-10-CM | POA: Diagnosis not present

## 2014-06-20 DIAGNOSIS — Z720 Tobacco use: Secondary | ICD-10-CM | POA: Diagnosis not present

## 2014-06-20 DIAGNOSIS — S299XXA Unspecified injury of thorax, initial encounter: Secondary | ICD-10-CM | POA: Insufficient documentation

## 2014-06-20 MED ORDER — NAPROXEN 250 MG PO TABS: 250.0000 mg | ORAL_TABLET | Freq: Two times a day (BID) | ORAL | Status: DC

## 2014-06-20 NOTE — ED Notes (Signed)
Pt verbalizes understanding of d/c instructions and denies any further need at this time. 
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
## 2014-06-20 NOTE — Discharge Instructions (Signed)
Back Exercises °Back exercises help treat and prevent back injuries. The goal of back exercises is to increase the strength of your abdominal and back muscles and the flexibility of your back. These exercises should be started when you no longer have back pain. Back exercises include: °· Pelvic Tilt. Lie on your back with your knees bent. Tilt your pelvis until the lower part of your back is against the floor. Hold this position 5 to 10 sec and repeat 5 to 10 times. °· Knee to Chest. Pull first 1 knee up against your chest and hold for 20 to 30 seconds, repeat this with the other knee, and then both knees. This may be done with the other leg straight or bent, whichever feels better. °· Sit-Ups or Curl-Ups. Bend your knees 90 degrees. Start with tilting your pelvis, and do a partial, slow sit-up, lifting your trunk only 30 to 45 degrees off the floor. Take at least 2 to 3 seconds for each sit-up. Do not do sit-ups with your knees out straight. If partial sit-ups are difficult, simply do the above but with only tightening your abdominal muscles and holding it as directed. °· Hip-Lift. Lie on your back with your knees flexed 90 degrees. Push down with your feet and shoulders as you raise your hips a couple inches off the floor; hold for 10 seconds, repeat 5 to 10 times. °· Back arches. Lie on your stomach, propping yourself up on bent elbows. Slowly press on your hands, causing an arch in your low back. Repeat 3 to 5 times. Any initial stiffness and discomfort should lessen with repetition over time. °· Shoulder-Lifts. Lie face down with arms beside your body. Keep hips and torso pressed to floor as you slowly lift your head and shoulders off the floor. °Do not overdo your exercises, especially in the beginning. Exercises may cause you some mild back discomfort which lasts for a few minutes; however, if the pain is more severe, or lasts for more than 15 minutes, do not continue exercises until you see your caregiver.  Improvement with exercise therapy for back problems is slow.  °See your caregivers for assistance with developing a proper back exercise program. °Document Released: 02/01/2004 Document Revised: 03/18/2011 Document Reviewed: 10/25/2010 °ExitCare® Patient Information ©2015 [employer]. This information is not intended to replace advice given to you by your health care provider. Make sure you discuss any questions you have with your health care provider. ° °Back Pain, Adult °Low back pain is very common. About 1 in 5 people have back pain. The cause of low back pain is rarely dangerous. The pain often gets better over time. About half of people with a sudden onset of back pain feel better in just 2 weeks. About 8 in 10 people feel better by 6 weeks.  °CAUSES °Some common causes of back pain include: °· Strain of the muscles or ligaments supporting the spine. °· Wear and tear (degeneration) of the spinal discs. °· Arthritis. °· Direct injury to the back. °DIAGNOSIS °Most of the time, the direct cause of low back pain is not known. However, back pain can be treated effectively even when the exact cause of the pain is unknown. Answering your caregiver's questions about your overall health and symptoms is one of the most accurate ways to make sure the cause of your pain is not dangerous. If your caregiver needs more information, he or she may order lab work or imaging tests (X-rays or MRIs). However, even if imaging tests show changes in your   back, this usually does not require surgery. °HOME CARE INSTRUCTIONS °For many people, back pain returns. Since low back pain is rarely dangerous, it is often a condition that people can learn to manage on their own.  °· Remain active. It is stressful on the back to sit or stand in one place. Do not sit, drive, or stand in one place for more than 30 minutes at a time. Take short walks on level surfaces as soon as pain allows. Try to increase the length of time you walk each  day. °· Do not stay in bed. Resting more than 1 or 2 days can delay your recovery. °· Do not avoid exercise or work. Your body is made to move. It is not dangerous to be active, even though your back may hurt. Your back will likely heal faster if you return to being active before your pain is gone. °· Pay attention to your body when you  bend and lift. Many people have less discomfort when lifting if they bend their knees, keep the load close to their bodies, and avoid twisting. Often, the most comfortable positions are those that put less stress on your recovering back. °· Find a comfortable position to sleep. Use a firm mattress and lie on your side with your knees slightly bent. If you lie on your back, put a pillow under your knees. °· Only take over-the-counter or prescription medicines as directed by your caregiver. Over-the-counter medicines to reduce pain and inflammation are often the most helpful. Your caregiver may prescribe muscle relaxant drugs. These medicines help dull your pain so you can more quickly return to your normal activities and healthy exercise. °· Put ice on the injured area. °· Put ice in a plastic bag. °· Place a towel between your skin and the bag. °· Leave the ice on for 15-20 minutes, 03-04 times a day for the first 2 to 3 days. After that, ice and heat may be alternated to reduce pain and spasms. °· Ask your caregiver about trying back exercises and gentle massage. This may be of some benefit. °· Avoid feeling anxious or stressed. Stress increases muscle tension and can worsen back pain. It is important to recognize when you are anxious or stressed and learn ways to manage it. Exercise is a great option. °SEEK MEDICAL CARE IF: °· You have pain that is not relieved with rest or medicine. °· You have pain that does not improve in 1 week. °· You have new symptoms. °· You are generally not feeling well. °SEEK IMMEDIATE MEDICAL CARE IF:  °· You have pain that radiates from your back into  your legs. °· You develop new bowel or bladder control problems. °· You have unusual weakness or numbness in your arms or legs. °· You develop nausea or vomiting. °· You develop abdominal pain. °· You feel faint. °Document Released: 12/24/2004 Document Revised: 06/25/2011 Document Reviewed: 04/27/2013 °ExitCare® Patient Information ©2015 [employer]. This information is not intended to replace advice given to you by your health care provider. Make sure you discuss any questions you have with your health care provider. °Motor Vehicle Collision °It is common to have multiple bruises and sore muscles after a motor vehicle collision (MVC). These tend to feel worse for the first 24 hours. You may have the most stiffness and soreness over the first several hours. You may also feel worse when you wake up the first morning after your collision. After this point, you will usually begin to improve with each day. The speed of improvement often depends on the severity of the collision,   the number of injuries, and the location and nature of these injuries. °HOME CARE INSTRUCTIONS °· Put ice on the injured area. °¨ Put ice in a plastic bag. °¨ Place a towel between your skin and the bag. °¨ Leave the ice on for 15-20 minutes, 3-4 times a day, or as directed by your health care provider. °· Drink enough fluids to keep your urine clear or pale yellow. Do not drink alcohol. °· Take a warm shower or bath once or twice a day. This will increase blood flow to sore muscles. °· You may return to activities as directed by your caregiver. Be careful when lifting, as this may aggravate neck or back pain. °· Only take over-the-counter or prescription medicines for pain, discomfort, or fever as directed by your caregiver. Do not use aspirin. This may increase bruising and bleeding. °SEEK IMMEDIATE MEDICAL CARE IF: °· You have numbness, tingling, or weakness in the arms or legs. °· You develop severe headaches not relieved with  medicine. °· You have severe neck pain, especially tenderness in the middle of the back of your neck. °· You have changes in bowel or bladder control. °· There is increasing pain in any area of the body. °· You have shortness of breath, light-headedness, dizziness, or fainting. °· You have chest pain. °· You feel sick to your stomach (nauseous), throw up (vomit), or sweat. °· You have increasing abdominal discomfort. °· There is blood in your urine, stool, or vomit. °· You have pain in your shoulder (shoulder strap areas). °· You feel your symptoms are getting worse. °MAKE SURE YOU: °· Understand these instructions. °· Will watch your condition. °· Will get help right away if you are not doing well or get worse. °Document Released: 12/24/2004 Document Revised: 05/10/2013 Document Reviewed: 05/23/2010 °ExitCare® Patient Information ©2015 [employer]. This information is not intended to replace advice given to you by your health care provider. Make sure you discuss any questions you have with your health care provider. ° °
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
## 2014-10-11 ENCOUNTER — Encounter (HOSPITAL_COMMUNITY): Payer: Self-pay | Admitting: *Deleted

## 2014-10-11 ENCOUNTER — Emergency Department (HOSPITAL_COMMUNITY): Payer: Medicare Other

## 2014-10-11 ENCOUNTER — Emergency Department (HOSPITAL_COMMUNITY)
Admission: EM | Admit: 2014-10-11 | Discharge: 2014-10-11 | Disposition: A | Discharge status: Home / Self Care | Payer: Medicare Other | Attending: Emergency Medicine | Admitting: Emergency Medicine

## 2014-10-11 DIAGNOSIS — J069 Acute upper respiratory infection, unspecified: Secondary | ICD-10-CM

## 2014-10-11 DIAGNOSIS — Z720 Tobacco use: Secondary | ICD-10-CM | POA: Insufficient documentation

## 2014-10-11 DIAGNOSIS — Z792 Long term (current) use of antibiotics: Secondary | ICD-10-CM | POA: Insufficient documentation

## 2014-10-11 DIAGNOSIS — Z79899 Other long term (current) drug therapy: Secondary | ICD-10-CM | POA: Insufficient documentation

## 2014-10-11 MED ORDER — FLUTICASONE PROPIONATE 50 MCG/ACT NA SUSP: 2.0000 | Freq: Every day | NASAL | Status: DC

## 2014-10-11 NOTE — Discharge Instructions (Signed)
Upper Respiratory Infection, Adult Most upper respiratory infections (URIs) are a viral infection of the air passages leading to the lungs. A URI affects the nose, throat, and upper air passages. The most common type of URI is nasopharyngitis and is typically referred to as "the common cold." URIs run their course and usually go away on their own. Most of the time, a URI does not require medical attention, but sometimes a bacterial infection in the upper airways can follow a viral infection. This is called a secondary infection. Sinus and middle ear infections are common types of secondary upper respiratory infections. Bacterial pneumonia can also complicate a URI. A URI can worsen asthma and chronic obstructive pulmonary disease (COPD). Sometimes, these complications can require emergency medical care and may be life threatening.  CAUSES Almost all URIs are caused by viruses. A virus is a type of germ and can spread from one person to another.  RISKS FACTORS You may be at risk for a URI if:   You smoke.   You have chronic heart or lung disease.  You have a weakened defense (immune) system.   You are very young or very old.   You have nasal allergies or asthma.  You work in crowded or poorly ventilated areas.  You work in health care facilities or schools. SIGNS AND SYMPTOMS  Symptoms typically develop 2-3 days after you come in contact with a cold virus. Most viral URIs last 7-10 days. However, viral URIs from the influenza virus (flu virus) can last 14-18 days and are typically more severe. Symptoms may include:   Runny or stuffy (congested) nose.   Sneezing.   Cough.   Sore throat.   Headache.   Fatigue.   Fever.   Loss of appetite.   Pain in your forehead, behind your eyes, and over your cheekbones (sinus pain).  Muscle aches.  DIAGNOSIS  Your health care provider may diagnose a URI by:  Physical exam.  Tests to check that your symptoms are not due to  another condition such as:  Strep throat.  Sinusitis.  Pneumonia.  Asthma. TREATMENT  A URI goes away on its own with time. It cannot be cured with medicines, but medicines may be prescribed or recommended to relieve symptoms. Medicines may help:  Reduce your fever.  Reduce your cough.  Relieve nasal congestion. HOME CARE INSTRUCTIONS   Take medicines only as directed by your health care provider.   Gargle warm saltwater or take cough drops to comfort your throat as directed by your health care provider.  Use a warm mist humidifier or inhale steam from a shower to increase air moisture. This may make it easier to breathe.  Drink enough fluid to keep your urine clear or pale yellow.   Eat soups and other clear broths and maintain good nutrition.   Rest as needed.   Return to work when your temperature has returned to normal or as your health care provider advises. You may need to stay home longer to avoid infecting others. You can also use a face mask and careful hand washing to prevent spread of the virus.  Increase the usage of your inhaler if you have asthma.   Do not use any tobacco products, including cigarettes, chewing tobacco, or electronic cigarettes. If you need help quitting, ask your health care provider. PREVENTION  The best way to protect yourself from getting a cold is to practice good hygiene.   Avoid oral or hand contact with people with cold  symptoms.   Wash your hands often if contact occurs.  There is no clear evidence that vitamin C, vitamin E, echinacea, or exercise reduces the chance of developing a cold. However, it is always recommended to get plenty of rest, exercise, and practice good nutrition.  SEEK MEDICAL CARE IF:   You are getting worse rather than better.   Your symptoms are not controlled by medicine.   You have chills.  You have worsening shortness of breath.  You have brown or red mucus.  You have yellow or brown nasal  discharge.  You have pain in your face, especially when you bend forward.  You have a fever.  You have swollen neck glands.  You have pain while swallowing.  You have white areas in the back of your throat. SEEK IMMEDIATE MEDICAL CARE IF:   You have severe or persistent:  Headache.  Ear pain.  Sinus pain.  Chest pain.  You have chronic lung disease and any of the following:  Wheezing.  Prolonged cough.  Coughing up blood.  A change in your usual mucus.  You have a stiff neck.  You have changes in your:  Vision.  Hearing.  Thinking.  Mood. MAKE SURE YOU:   Understand these instructions.  Will watch your condition.  Will get help right away if you are not doing well or get worse.   This information is not intended to replace advice given to you by your health care provider. Make sure you discuss any questions you have with your health care provider.   Document Released: 06/19/2000 Document Revised: 05/10/2014 Document Reviewed: 03/31/2013 Elsevier Interactive Patient Education [employer]  Please read the above information, follow-up with primary care for reevaluation in 3 days if symptoms continue to persist. Return immediately if they worsen.

## 2014-10-11 NOTE — ED Notes (Addendum)
Pt in c/o cough for the last few days, report seeing blood on a tissue after coughing today, denies fever, no distress noted-reports sore throat also

## 2014-10-11 NOTE — ED Provider Notes (Signed)
CSN: [id number]     Arrival date & time 10/11/14  1631 History  By signing my name below, I, Murriel Hopper, attest that this documentation has been prepared under the direction and in the presence of Burna Forts, PA-C  Electronically Signed: Murriel Hopper, ED Scribe. 10/11/2014. 5:42 PM.    Chief Complaint  Patient presents with  . Cough     The history is provided by the patient. No language interpreter was used.   HPI Comments: Dominique Webb is a 29 y.o. female who presents to the Emergency Department complaining of a worsening intermittent productive cough with associated congestion, sore throat, and rhinorrhea that has been present for a few days. Pt states she coughed today into a tissue and noticed blood in her sputum. Pt denies taking any medication to treat her symptoms.  Pt reports positive sick contact from her co-workers. Pt states she is a smoker. Pt denies diarrhea, abdominal pain, fever.      History reviewed. No pertinent past medical history. History reviewed. No pertinent past surgical history. History reviewed. No pertinent family history. Social History  Substance Use Topics  . Smoking status: Current Every Day Smoker -- 0.00 packs/day    Types: Cigarettes  . Smokeless tobacco: Never Used  . Alcohol Use: Yes     Comment: Vodka ocassionally   OB History    Gravida Para Term Preterm AB TAB SAB Ectopic Multiple Living   0              Review of Systems  A complete 10 system review of systems was obtained and all systems are negative except as noted in the HPI and PMH.    Allergies  Aspirin  Home Medications   Prior to Admission medications   Medication Sig Start Date End Date Taking? Authorizing Provider  cephALEXin (KEFLEX) 500 MG capsule Take 2 capsules (1,000 mg total) by mouth 2 (two) times daily. 05/09/14   Arby Barrette, MD  etonogestrel (NEXPLANON) 68 MG IMPL implant 1 each by Subdermal route once.    Historical Provider, MD  fluticasone  (FLONASE) 50 MCG/ACT nasal spray Place 2 sprays into both nostrils daily. 10/11/14   Eyvonne Mechanic, PA-C  HYDROcodone-acetaminophen (NORCO) 5-325 MG per tablet Take 1 tablet by mouth every 6 (six) hours as needed. Patient not taking: Reported on 02/26/2014 11/23/13   Toy Cookey, MD  metroNIDAZOLE (FLAGYL) 500 MG tablet Take 1 tablet (500 mg total) by mouth 2 (two) times daily. One po bid x 7 days Patient not taking: Reported on 05/09/2014 02/26/14   Junius Finner, PA-C  naproxen (NAPROSYN) 250 MG tablet Take 1 tablet (250 mg total) by mouth 2 (two) times daily with a meal. 06/20/14   Everlene Farrier, PA-C  omeprazole (PRILOSEC) 20 MG capsule Take 1 capsule (20 mg total) by mouth daily. Patient not taking: Reported on 02/26/2014 07/25/13   Dahlia Client Muthersbaugh, PA-C  ondansetron (ZOFRAN ODT) 4 MG disintegrating tablet  ODT q4 hours prn nausea/vomit Patient not taking: Reported on 02/26/2014 07/25/13   Dahlia Client Muthersbaugh, PA-C  phenazopyridine (PYRIDIUM) 200 MG tablet Take 1 tablet (200 mg total) by mouth 3 (three) times daily as needed for pain. 05/09/14   Arby Barrette, MD   BP 118/62 mmHg  Pulse 74  Temp(Src) 98.5 F (36.9 C) (Oral)  Resp 20  SpO2 100% Physical Exam  Constitutional: She is oriented to person, place, and time. She appears well-developed and well-nourished.  HENT:  Head: Normocephalic and atraumatic.  Right Ear: External  ear normal.  Left Ear: External ear normal.  Mouth/Throat: Oropharynx is clear and moist. No oropharyngeal exudate.  Sinuses erythematous and swollen  Cardiovascular: Normal rate.   Pulmonary/Chest: Effort normal.  Abdominal: She exhibits no distension.  Neurological: She is alert and oriented to person, place, and time.  Skin: Skin is warm and dry.  Psychiatric: She has a normal mood and affect.  Nursing note and vitals reviewed.   ED Course  Procedures (including critical care time)  DIAGNOSTIC STUDIES: Oxygen Saturation is 100% on room air,  normal by my interpretation.    COORDINATION OF CARE: 5:39 PM Discussed treatment plan with pt at bedside and pt agreed to plan.   Labs Review Labs Reviewed - No data to display  Imaging Review Dg Chest 2 View  10/11/2014   CLINICAL DATA:  Coughing for 3 days  EXAM: CHEST  2 VIEW  COMPARISON:  11/23/2013  FINDINGS: Normal heart size. Clear lungs. No pneumothorax or pleural effusion.  IMPRESSION: No active cardiopulmonary disease.   Electronically Signed   By: Jolaine Click M.D.   On: 10/11/2014 17:27   I have personally reviewed and evaluated these images and lab results as part of my medical decision-making.   EKG Interpretation None      MDM   Final diagnoses:  URI (upper respiratory infection)  Labs:  Imaging: DG chest normal  Consults:  Therapeutics:  Discharge Meds: Flonase  Assessment/Plan: Patient presents with likely viral URI, she has no shortness of breath, chest pain, while signs reassuring. Lung sounds clear. Chest x-ray normal. Patient encouraged to use Flonase, symptomatic care instructions including Tylenol ibuprofen, rest, follow-up with primary care symptoms persist, return immediately if they worsen. Patient verbalized understanding and agreement for today's plan.     I personally performed the services described in this documentation, which was scribed in my presence. The recorded information has been reviewed and is accurate.    Eyvonne Mechanic, PA-C 10/12/14 2147  Margarita Grizzle, MD 10/13/14 [phone]

## 2015-02-22 ENCOUNTER — Encounter (HOSPITAL_COMMUNITY): Payer: Self-pay | Admitting: Emergency Medicine

## 2015-02-22 ENCOUNTER — Emergency Department (HOSPITAL_COMMUNITY)
Admission: EM | Admit: 2015-02-22 | Discharge: 2015-02-22 | Disposition: A | Discharge status: Home / Self Care | Payer: No Typology Code available for payment source | Attending: Emergency Medicine | Admitting: Emergency Medicine

## 2015-02-22 DIAGNOSIS — Z793 Long term (current) use of hormonal contraceptives: Secondary | ICD-10-CM | POA: Insufficient documentation

## 2015-02-22 DIAGNOSIS — Z791 Long term (current) use of non-steroidal anti-inflammatories (NSAID): Secondary | ICD-10-CM | POA: Insufficient documentation

## 2015-02-22 DIAGNOSIS — R21 Rash and other nonspecific skin eruption: Secondary | ICD-10-CM | POA: Diagnosis not present

## 2015-02-22 DIAGNOSIS — Z7951 Long term (current) use of inhaled steroids: Secondary | ICD-10-CM | POA: Diagnosis not present

## 2015-02-22 DIAGNOSIS — Z792 Long term (current) use of antibiotics: Secondary | ICD-10-CM | POA: Diagnosis not present

## 2015-02-22 DIAGNOSIS — F17210 Nicotine dependence, cigarettes, uncomplicated: Secondary | ICD-10-CM | POA: Diagnosis not present

## 2015-02-22 DIAGNOSIS — L299 Pruritus, unspecified: Secondary | ICD-10-CM | POA: Diagnosis present

## 2015-02-22 MED ORDER — PREDNISONE 20 MG PO TABS
60.0000 mg | ORAL_TABLET | Freq: Once | ORAL | Status: AC
  Administered 2015-02-22: 60 mg via ORAL
  Filled 2015-02-22: qty 3

## 2015-02-22 MED ORDER — DIPHENHYDRAMINE HCL 25 MG PO CAPS
25.0000 mg | ORAL_CAPSULE | Freq: Once | ORAL | Status: AC
  Administered 2015-02-22: 25 mg via ORAL
  Filled 2015-02-22: qty 1

## 2015-02-22 MED ORDER — FAMOTIDINE 20 MG PO TABS
40.0000 mg | ORAL_TABLET | Freq: Once | ORAL | Status: AC
  Administered 2015-02-22: 40 mg via ORAL
  Filled 2015-02-22: qty 2

## 2015-02-22 MED ORDER — PREDNISONE 20 MG PO TABS: 60.0000 mg | ORAL_TABLET | Freq: Every day | ORAL | Status: DC

## 2015-02-22 NOTE — ED Notes (Signed)
Pt. reports multiple insect bites at face and right hand yesterday morning with itching and swelling , denies fever , airway intact /respirations unlabored with no oral swelling .

## 2015-02-22 NOTE — ED Notes (Signed)
Pt verbalized understanding of d/c instructions, prescriptions, and follow-up care. No further questions/concerns, VSS, ambulatory w/ steady gait (refused wheelchair) 

## 2015-02-22 NOTE — ED Provider Notes (Signed)
CSN: [id number]     Arrival date & time 02/22/15  0439 History   First MD Initiated Contact with Patient 02/22/15 [phone]     Chief Complaint  Patient presents with  . Insect Bite     (Consider location/radiation/quality/duration/timing/severity/associated sxs/prior Treatment) HPI Comments: Patient presents today complaining of multiple insect bites to the face, neck, and right hand.  She states that she first noticed the bites yesterday morning.  She reports that she has developed more bites since that time. She states that she did not actually see or feel an insect bite her, but assumed that the bumps were from insect bites.  She states that the areas itch intermittently.  She has not tried any treatment prior to arrival.  Denies swelling of the tongue, lips, or throat.  Denies SOB, nausea, vomiting, or abdominal pain.  No fevers or chills.  She denies any new soaps, detergents, lotions, or medications.  No contacts with similar rash.  She states that she has never had a rash like this before.    The history is provided by the patient.    History reviewed. No pertinent past medical history. History reviewed. No pertinent past surgical history. No family history on file. Social History  Substance Use Topics  . Smoking status: Current Every Day Smoker -- 0.00 packs/day    Types: Cigarettes  . Smokeless tobacco: Never Used  . Alcohol Use: Yes   OB History    Gravida Para Term Preterm AB TAB SAB Ectopic Multiple Living   0              Review of Systems  All other systems reviewed and are negative.     Allergies  Aspirin  Home Medications   Prior to Admission medications   Medication Sig Start Date End Date Taking? Authorizing Provider  cephALEXin (KEFLEX) 500 MG capsule Take 2 capsules (1,000 mg total) by mouth 2 (two) times daily. 05/09/14   Arby Barrette, MD  etonogestrel (NEXPLANON) 68 MG IMPL implant 1 each by Subdermal route once.    Historical Provider, MD  fluticasone  (FLONASE) 50 MCG/ACT nasal spray Place 2 sprays into both nostrils daily. 10/11/14   Eyvonne Mechanic, PA-C  HYDROcodone-acetaminophen (NORCO) 5-325 MG per tablet Take 1 tablet by mouth every 6 (six) hours as needed. Patient not taking: Reported on 02/26/2014 11/23/13   Toy Cookey, MD  metroNIDAZOLE (FLAGYL) 500 MG tablet Take 1 tablet (500 mg total) by mouth 2 (two) times daily. One po bid x 7 days Patient not taking: Reported on 05/09/2014 02/26/14   Junius Finner, PA-C  naproxen (NAPROSYN) 250 MG tablet Take 1 tablet (250 mg total) by mouth 2 (two) times daily with a meal. 06/20/14   Everlene Farrier, PA-C  omeprazole (PRILOSEC) 20 MG capsule Take 1 capsule (20 mg total) by mouth daily. Patient not taking: Reported on 02/26/2014 07/25/13   Dahlia Client Muthersbaugh, PA-C  ondansetron (ZOFRAN ODT) 4 MG disintegrating tablet  ODT q4 hours prn nausea/vomit Patient not taking: Reported on 02/26/2014 07/25/13   Dahlia Client Muthersbaugh, PA-C  phenazopyridine (PYRIDIUM) 200 MG tablet Take 1 tablet (200 mg total) by mouth 3 (three) times daily as needed for pain. 05/09/14   Arby Barrette, MD   BP 108/61 mmHg  Pulse 63  Temp(Src) 97.4 F (36.3 C) (Oral)  Resp 14  Ht  (1.6 m)  Wt 62.143 kg  BMI 24.27 kg/m2  SpO2 99% Physical Exam  Constitutional: She appears well-developed and well-nourished.  HENT:  Head:  Normocephalic and atraumatic.  Mouth/Throat: Oropharynx is clear and moist.  No swelling of lips, tongue, or throat. Airway widely patent  Neck: Normal range of motion. Neck supple.  Cardiovascular: Normal rate, regular rhythm and normal heart sounds.   Pulmonary/Chest: Effort normal and breath sounds normal. She has no wheezes.  Abdominal: Soft. There is no tenderness.  Musculoskeletal: Normal range of motion.  Neurological: She is alert.  Skin: Skin is warm and dry. She is not diaphoretic.  Several erythematous raised round papules of the face, neck, and right hand.  No drainage.  No rash of the  web spaces of the fingers.  No rash present on the trunk.  Psychiatric: She has a normal mood and affect.  Nursing note and vitals reviewed.   ED Course  Procedures (including critical care time) Labs Review Labs Reviewed - No data to display  Imaging Review No results found. I have personally reviewed and evaluated these images and lab results as part of my medical decision-making.   EKG Interpretation None      MDM   Final diagnoses:  None   Patient presents today complaining of insect bites to the face, neck, and hands.  However, she states that she did not feel or see an insect actually bite her.  She does have several raised erythematous papules on exam. No signs of anaphylaxis.  Patient given Prednisone, Pepcid, and Benadryl in the ED.  Feel that the patient is stable for discharge.  Strict return precautions given.      Santiago Glad, PA-C 02/23/15 2031  Layla Maw Ward, DO 02/24/15 2328

## 2015-12-04 ENCOUNTER — Emergency Department (HOSPITAL_COMMUNITY)
Admission: EM | Admit: 2015-12-04 | Discharge: 2015-12-04 | Disposition: A | Discharge status: Home / Self Care | Payer: No Typology Code available for payment source | Attending: Emergency Medicine | Admitting: Emergency Medicine

## 2015-12-04 ENCOUNTER — Encounter (HOSPITAL_COMMUNITY): Payer: Self-pay

## 2015-12-04 DIAGNOSIS — J069 Acute upper respiratory infection, unspecified: Secondary | ICD-10-CM | POA: Insufficient documentation

## 2015-12-04 DIAGNOSIS — Z79899 Other long term (current) drug therapy: Secondary | ICD-10-CM | POA: Insufficient documentation

## 2015-12-04 DIAGNOSIS — F17210 Nicotine dependence, cigarettes, uncomplicated: Secondary | ICD-10-CM | POA: Insufficient documentation

## 2015-12-04 DIAGNOSIS — B9789 Other viral agents as the cause of diseases classified elsewhere: Secondary | ICD-10-CM

## 2015-12-04 NOTE — ED Triage Notes (Addendum)
Pt reports generalized body aches and flu like symptoms since Saturday. Pt reports some episodes of emesis as well as a headache X2 days. Pt reports she is unable to tolerate PO intake.

## 2015-12-04 NOTE — ED Provider Notes (Signed)
[facility]-EMERGENCY DEPT Provider Note   CSN: [id number] Arrival date & time: 12/04/15  1322   By signing my name below, I, Clovis PuAvnee Patel, attest that this documentation has been prepared under the direction and in the presence of Raeford RazorStephen Donnel Venuto, MD  Electronically Signed: Clovis PuAvnee Patel, ED Scribe. 12/04/15. 3:42 PM.   History   Chief Complaint Chief Complaint  Patient presents with  . URI    The history is provided by the patient. No language interpreter was used.   HPI Comments:  Dominique Webb is a 30 y.o. female who presents to the Emergency Department complaining of flu-like symptoms x 2 days. She notes associated headaches, body aches, a persistent cough x 3-4 weeks, diaphoresis, chills, episodes of emesis and chest tightness. She notes sick contacts at work. Pt has taken aleve with no relief. Pt denies sore throat, diarrhea, rash, any other associated symptoms and modifying factors at this time.    History reviewed. No pertinent past medical history.  There are no active problems to display for this patient.   History reviewed. No pertinent surgical history.  OB History    Gravida Para Term Preterm AB Living   0             SAB TAB Ectopic Multiple Live Births                   Home Medications    Prior to Admission medications   Medication Sig Start Date End Date Taking? Authorizing Provider  cephALEXin (KEFLEX) 500 MG capsule Take 2 capsules (1,000 mg total) by mouth 2 (two) times daily. 05/09/14   Arby BarretteMarcy Pfeiffer, MD  etonogestrel (NEXPLANON) 68 MG IMPL implant 1 each by Subdermal route once.    Historical Provider, MD  fluticasone (FLONASE) 50 MCG/ACT nasal spray Place 2 sprays into both nostrils daily. 10/11/14   Eyvonne MechanicJeffrey Hedges, PA-C  HYDROcodone-acetaminophen (NORCO) 5-325 MG per tablet Take 1 tablet by mouth every 6 (six) hours as needed. Patient not taking: Reported on 02/26/2014 11/23/13   Toy CookeyMegan Docherty, MD  metroNIDAZOLE (FLAGYL) 500 MG tablet Take 1 tablet  (500 mg total) by mouth 2 (two) times daily. One po bid x 7 days Patient not taking: Reported on 05/09/2014 02/26/14   Junius FinnerErin O'Malley, PA-C  naproxen (NAPROSYN) 250 MG tablet Take 1 tablet (250 mg total) by mouth 2 (two) times daily with a meal. 06/20/14   Everlene FarrierWilliam Dansie, PA-C  omeprazole (PRILOSEC) 20 MG capsule Take 1 capsule (20 mg total) by mouth daily. Patient not taking: Reported on 02/26/2014 07/25/13   Dahlia ClientHannah Muthersbaugh, PA-C  ondansetron (ZOFRAN ODT) 4 MG disintegrating tablet 4mg  ODT q4 hours prn nausea/vomit Patient not taking: Reported on 02/26/2014 07/25/13   Dahlia ClientHannah Muthersbaugh, PA-C  phenazopyridine (PYRIDIUM) 200 MG tablet Take 1 tablet (200 mg total) by mouth 3 (three) times daily as needed for pain. 05/09/14   Arby BarretteMarcy Pfeiffer, MD  predniSONE (DELTASONE) 20 MG tablet Take 3 tablets (60 mg total) by mouth daily. 02/22/15   Santiago GladHeather Laisure, PA-C    Family History History reviewed. No pertinent family history.  Social History Social History  Substance Use Topics  . Smoking status: Current Every Day Smoker    Packs/day: 0.00    Types: Cigarettes  . Smokeless tobacco: Never Used  . Alcohol use Yes     Allergies   Aspirin   Review of Systems Review of Systems  Constitutional: Positive for fever (subjective).  HENT: Negative for sore throat.   Respiratory: Positive for  cough and chest tightness.   Gastrointestinal: Positive for vomiting. Negative for diarrhea.  Genitourinary: Negative for difficulty urinating.  Musculoskeletal: Positive for myalgias.  Skin: Negative for rash.  All other systems reviewed and are negative.  Physical Exam Updated Vital Signs BP 120/90 (BP Location: Right Arm)   Pulse 67   Temp 98.4 F (36.9 C) (Oral)   Resp 18   Ht 5\' 3"  (1.6 m)   Wt 126 lb 1 oz (57.2 kg)   SpO2 100%   BMI 22.33 kg/m   Physical Exam  Constitutional: She is oriented to person, place, and time. She appears well-developed and well-nourished. No distress.  HENT:  Head:  Normocephalic and atraumatic.  Eyes: Conjunctivae are normal.  Cardiovascular: Normal rate.   Pulmonary/Chest: Effort normal and breath sounds normal.  Abdominal: She exhibits no distension.  Neurological: She is alert and oriented to person, place, and time.  Skin: Skin is warm and dry.  Psychiatric: She has a normal mood and affect.  Nursing note and vitals reviewed.  ED Treatments / Results  DIAGNOSTIC STUDIES:  Oxygen Saturation is 100% on RA, normal by my interpretation.    COORDINATION OF CARE:  3:41 PM Discussed treatment plan with pt at bedside and pt agreed to plan.  Labs (all labs ordered are listed, but only abnormal results are displayed) Labs Reviewed - No data to display  EKG  EKG Interpretation None       Radiology No results found.  Procedures Procedures (including critical care time)  Medications Ordered in ED Medications - No data to display   Initial Impression / Assessment and Plan / ED Course  I have reviewed the triage vital signs and the nursing notes.  Pertinent labs & imaging results that were available during my care of the patient were reviewed by me and considered in my medical decision making (see chart for details).  Clinical Course    Likely viral illness. Doubt sbi or other emergent process.    Final Clinical Impressions(s) / ED Diagnoses   Final diagnoses:  Viral URI with cough    New Prescriptions New Prescriptions   No medications on file    I personally preformed the services scribed in my presence. The recorded information has been reviewed is accurate. Raeford RazorStephen Mcgwire Dasaro, MD.    Raeford RazorStephen Jerame Hedding, MD 12/12/15 1000

## 2016-08-12 ENCOUNTER — Encounter (HOSPITAL_COMMUNITY): Payer: Self-pay

## 2016-08-12 DIAGNOSIS — N83201 Unspecified ovarian cyst, right side: Secondary | ICD-10-CM | POA: Insufficient documentation

## 2016-08-12 DIAGNOSIS — F17210 Nicotine dependence, cigarettes, uncomplicated: Secondary | ICD-10-CM | POA: Insufficient documentation

## 2016-08-12 DIAGNOSIS — Z79899 Other long term (current) drug therapy: Secondary | ICD-10-CM | POA: Insufficient documentation

## 2016-08-12 LAB — URINALYSIS, ROUTINE W REFLEX MICROSCOPIC
Bilirubin Urine: NEGATIVE
Glucose, UA: NEGATIVE mg/dL
Ketones, ur: 20 mg/dL — AB
Leukocytes, UA: NEGATIVE
Nitrite: NEGATIVE
Protein, ur: NEGATIVE mg/dL
SPECIFIC GRAVITY, URINE: 1.024 (ref 1.005–1.030)
pH: 6 (ref 5.0–8.0)

## 2016-08-12 LAB — COMPREHENSIVE METABOLIC PANEL
ALT: 17 U/L (ref 14–54)
AST: 24 U/L (ref 15–41)
Albumin: 4.3 g/dL (ref 3.5–5.0)
Alkaline Phosphatase: 57 U/L (ref 38–126)
Anion gap: 8 (ref 5–15)
BILIRUBIN TOTAL: 1.2 mg/dL (ref 0.3–1.2)
BUN: 6 mg/dL (ref 6–20)
CHLORIDE: 106 mmol/L (ref 101–111)
CO2: 25 mmol/L (ref 22–32)
Calcium: 9.4 mg/dL (ref 8.9–10.3)
Creatinine, Ser: 0.86 mg/dL (ref 0.44–1.00)
GFR calc Af Amer: >60 mL/min (ref >60)
GFR calc non Af Amer: >60 mL/min (ref >60)
GLUCOSE: 92 mg/dL (ref 65–99)
POTASSIUM: 3.5 mmol/L (ref 3.5–5.1)
Sodium: 139 mmol/L (ref 135–145)
TOTAL PROTEIN: 6.9 g/dL (ref 6.5–8.1)

## 2016-08-12 LAB — CBC
HEMATOCRIT: 42.9 % (ref 36.0–46.0)
Hemoglobin: 14.5 g/dL (ref 12.0–15.0)
MCH: 32.3 pg (ref 26.0–34.0)
MCHC: 33.8 g/dL (ref 30.0–36.0)
MCV: 95.5 fL (ref 78.0–100.0)
Platelets: 219 10*3/uL (ref 150–400)
RBC: 4.49 MIL/uL (ref 3.87–5.11)
RDW: 13 % (ref 11.5–15.5)
WBC: 16.1 10*3/uL — ABNORMAL HIGH (ref 4.0–10.5)

## 2016-08-12 LAB — LIPASE, BLOOD: Lipase: 25 U/L (ref 11–51)

## 2016-08-12 LAB — POC URINE PREG, ED: PREG TEST UR: NEGATIVE

## 2016-08-12 NOTE — ED Triage Notes (Signed)
PT reports RLQ pain and bilateral breast pain x 3 day. Endorses 1 episode of emesis. Denies diarrhea. Unsure when last period was.

## 2016-08-13 ENCOUNTER — Emergency Department (HOSPITAL_COMMUNITY): Payer: Self-pay

## 2016-08-13 ENCOUNTER — Emergency Department (HOSPITAL_COMMUNITY)
Admission: EM | Admit: 2016-08-13 | Discharge: 2016-08-13 | Disposition: A | Discharge status: Home / Self Care | Payer: Self-pay | Attending: Emergency Medicine | Admitting: Emergency Medicine

## 2016-08-13 DIAGNOSIS — N83201 Unspecified ovarian cyst, right side: Secondary | ICD-10-CM

## 2016-08-13 MED ORDER — MORPHINE SULFATE (PF) 4 MG/ML IV SOLN
4.0000 mg | Freq: Once | INTRAVENOUS | Status: AC
  Administered 2016-08-13: 4 mg via INTRAVENOUS
  Filled 2016-08-13: qty 1

## 2016-08-13 MED ORDER — ONDANSETRON HCL 4 MG/2ML IJ SOLN
4.0000 mg | Freq: Once | INTRAMUSCULAR | Status: AC
  Administered 2016-08-13: 4 mg via INTRAVENOUS
  Filled 2016-08-13: qty 2

## 2016-08-13 MED ORDER — SODIUM CHLORIDE 0.9 % IV BOLUS (SEPSIS)
1000.0000 mL | Freq: Once | INTRAVENOUS | Status: AC
  Administered 2016-08-13: 1000 mL via INTRAVENOUS

## 2016-08-13 MED ORDER — IOPAMIDOL (ISOVUE-300) INJECTION 61%
INTRAVENOUS | Status: AC
Start: 2016-08-13 — End: 2016-08-13
  Administered 2016-08-13: 100 mL
  Filled 2016-08-13: qty 100

## 2016-08-13 MED ORDER — HYDROCODONE-ACETAMINOPHEN 5-325 MG PO TABS: 1.0000 | ORAL_TABLET | Freq: Four times a day (QID) | ORAL | 0 refills | Status: DC | PRN

## 2016-08-13 NOTE — ED Provider Notes (Signed)
[facility]-EMERGENCY DEPT Provider Note   CSN: [id number] Arrival date & time: 08/12/16  1640  By signing my name below, I, Ny'Kea Webb, attest that this documentation has been prepared under the direction and in the presence of Geoffery Lyonselo, Gadiel John, MD. Electronically Signed: Karren CobbleNy'Kea Webb, ED Scribe. 08/13/16. 2:39 AM.  History   Chief Complaint Chief Complaint  Patient presents with  . Abdominal Pain  . Breast Pain   The history is provided by the patient. No language interpreter was used.    HPI HPI Comments: Dominique Webb is a 31 y.o. female who presents to the Emergency Department complaining of sudden onset, gradually worsening, persistent right lower quadrant abdominal pain that radiates into her left lower quadrant ad her lower back that began three days ago. She notes associated on episode of emesis, orange urine and bilateral breast pain. At this time she reports her back pain has resolved. Pt has a Nexplanon, in place and prior to tonight she has not had MP, but she states 30 minutes ago her MP began. She denies dysuria, hematuria, constipation, diarrhea, vaginal discharge, vaginal pain, or fever.   History reviewed. No pertinent past medical history.  There are no active problems to display for this patient.  History reviewed. No pertinent surgical history.  OB History    Gravida Para Term Preterm AB Living   0             SAB TAB Ectopic Multiple Live Births                 Home Medications    Prior to Admission medications   Medication Sig Start Date End Date Taking? Authorizing Provider  cephALEXin (KEFLEX) 500 MG capsule Take 2 capsules (1,000 mg total) by mouth 2 (two) times daily. 05/09/14   Arby BarrettePfeiffer, Marcy, MD  etonogestrel (NEXPLANON) 68 MG IMPL implant 1 each by Subdermal route once.    [provider]  fluticasone (FLONASE) 50 MCG/ACT nasal spray Place 2 sprays into both nostrils daily. 10/11/14   Hedges, Tinnie GensJeffrey, PA-C  HYDROcodone-acetaminophen (NORCO) 5-325  MG per tablet Take 1 tablet by mouth every 6 (six) hours as needed. Patient not taking: Reported on 02/26/2014 11/23/13   Toy Cookeyocherty, Megan, MD  metroNIDAZOLE (FLAGYL) 500 MG tablet Take 1 tablet (500 mg total) by mouth 2 (two) times daily. One po bid x 7 days Patient not taking: Reported on 05/09/2014 02/26/14   Lurene ShadowPhelps, Erin O, PA-C  naproxen (NAPROSYN) 250 MG tablet Take 1 tablet (250 mg total) by mouth 2 (two) times daily with a meal. 06/20/14   Everlene Farrieransie, William, PA-C  omeprazole (PRILOSEC) 20 MG capsule Take 1 capsule (20 mg total) by mouth daily. Patient not taking: Reported on 02/26/2014 07/25/13   Muthersbaugh, Dahlia ClientHannah, PA-C  ondansetron (ZOFRAN ODT) 4 MG disintegrating tablet 4mg  ODT q4 hours prn nausea/vomit Patient not taking: Reported on 02/26/2014 07/25/13   Muthersbaugh, Dahlia ClientHannah, PA-C  phenazopyridine (PYRIDIUM) 200 MG tablet Take 1 tablet (200 mg total) by mouth 3 (three) times daily as needed for pain. 05/09/14   Arby BarrettePfeiffer, Marcy, MD  predniSONE (DELTASONE) 20 MG tablet Take 3 tablets (60 mg total) by mouth daily. 02/22/15   Santiago GladLaisure, Heather, PA-C   Family History No family history on file.  Social History Social History  Substance Use Topics  . Smoking status: Current Every Day Smoker    Packs/day: 0.00    Types: Cigarettes  . Smokeless tobacco: Never Used  . Alcohol use Yes   Allergies  Aspirin  Review of Systems Review of Systems  Constitutional: Negative for fever.  Gastrointestinal: Positive for abdominal pain and vomiting. Negative for constipation and diarrhea.  Genitourinary: Negative for dysuria, vaginal discharge and vaginal pain.  All other systems reviewed and are negative.  Physical Exam Updated Vital Signs BP 115/67 (BP Location: Right Arm)   Pulse (!) 56   Temp 98.9 F (37.2 C) (Oral)   Resp 14   SpO2 100%   Physical Exam  Constitutional: She is oriented to person, place, and time. She appears well-developed and well-nourished. No distress.  HENT:  Head:  Normocephalic and atraumatic.  Eyes: EOM are normal.  Neck: Normal range of motion.  Cardiovascular: Normal rate, regular rhythm and normal heart sounds.   Pulmonary/Chest: Effort normal and breath sounds normal.  Abdominal: Soft. Bowel sounds are normal. She exhibits no distension. There is tenderness. There is no rebound and no guarding.  TTP in the RLQ and suprapubic region.  Musculoskeletal: Normal range of motion.  Neurological: She is alert and oriented to person, place, and time.  Skin: Skin is warm and dry.  Psychiatric: She has a normal mood and affect. Judgment normal.  Nursing note and vitals reviewed.  ED Treatments / Results  DIAGNOSTIC STUDIES: Oxygen Saturation is 100% on RA, normal by my interpretation.   COORDINATION OF CARE: 1:18 AM-Discussed next steps with pt. Pt verbalized understanding and is agreeable with the plan.   Labs (all labs ordered are listed, but only abnormal results are displayed) Labs Reviewed  CBC - Abnormal; Notable for the following:       Result Value   WBC 16.1 (*)    All other components within normal limits  URINALYSIS, ROUTINE W REFLEX MICROSCOPIC - Abnormal; Notable for the following:    APPearance HAZY (*)    Hgb urine dipstick SMALL (*)    Ketones, ur 20 (*)    Bacteria, UA RARE (*)    Squamous Epithelial / LPF 6-30 (*)    All other components within normal limits  LIPASE, BLOOD  COMPREHENSIVE METABOLIC PANEL  POC URINE PREG, ED    EKG  EKG Interpretation None       Radiology No results found.  Procedures Procedures (including critical care time)  Medications Ordered in ED Medications - No data to display   Initial Impression / Assessment and Plan / ED Course  I have reviewed the triage vital signs and the nursing notes.  Pertinent labs & imaging results that were available during my care of the patient were reviewed by me and considered in my medical decision making (see chart for details).  Patient  presents with right lower quadrant pain for the past 3 days. Her workup today reveals an elevated white count, however CT scan shows no evidence for appendicitis. She does have a 2 cm ovarian cyst which I suspect is the source of her discomfort. This will be treated with pain medicine and follow-up with GYN. She is to return as needed if symptoms worsen or change.  Final Clinical Impressions(s) / ED Diagnoses   Final diagnoses:  None    New Prescriptions New Prescriptions   No medications on file  I personally performed the services described in this documentation, which was scribed in my presence. The recorded information has been reviewed and is accurate.        Geoffery Lyons, MD 08/13/16 [phone]

## 2016-08-13 NOTE — Discharge Instructions (Signed)
Hydrocodone as prescribed as needed for pain.  You should follow-up with gynecology in the next 1-2 weeks. The contact information for the women's outpatient clinic has been provided in this discharge summary for you to call and make these arrangements.  Return to the emergency department if you develop high fevers, worsening pain, or other new and concerning symptoms.

## 2016-08-13 NOTE — ED Notes (Signed)
Patient transported to CT with transporter 

## 2016-08-26 ENCOUNTER — Emergency Department (HOSPITAL_COMMUNITY): Payer: Self-pay

## 2016-08-26 ENCOUNTER — Emergency Department (HOSPITAL_COMMUNITY)
Admission: EM | Admit: 2016-08-26 | Discharge: 2016-08-26 | Disposition: A | Discharge status: Home / Self Care | Payer: Self-pay | Attending: Emergency Medicine | Admitting: Emergency Medicine

## 2016-08-26 ENCOUNTER — Encounter (HOSPITAL_COMMUNITY): Payer: Self-pay | Admitting: Emergency Medicine

## 2016-08-26 DIAGNOSIS — N760 Acute vaginitis: Secondary | ICD-10-CM | POA: Insufficient documentation

## 2016-08-26 DIAGNOSIS — B9689 Other specified bacterial agents as the cause of diseases classified elsewhere: Secondary | ICD-10-CM | POA: Insufficient documentation

## 2016-08-26 DIAGNOSIS — N83201 Unspecified ovarian cyst, right side: Secondary | ICD-10-CM | POA: Insufficient documentation

## 2016-08-26 LAB — CBC WITH DIFFERENTIAL/PLATELET
Basophils Absolute: 0 10*3/uL (ref 0.0–0.1)
Basophils Relative: 0 %
Eosinophils Absolute: 0 10*3/uL (ref 0.0–0.7)
Eosinophils Relative: 0 %
HCT: 37.8 % (ref 36.0–46.0)
Hemoglobin: 13 g/dL (ref 12.0–15.0)
Lymphocytes Relative: 28 %
Lymphs Abs: 3.1 10*3/uL (ref 0.7–4.0)
MCH: 32.8 pg (ref 26.0–34.0)
MCHC: 34.4 g/dL (ref 30.0–36.0)
MCV: 95.5 fL (ref 78.0–100.0)
Monocytes Absolute: 0.7 10*3/uL (ref 0.1–1.0)
Monocytes Relative: 6 %
Neutro Abs: 7.2 10*3/uL (ref 1.7–7.7)
Neutrophils Relative %: 66 %
Platelets: 205 10*3/uL (ref 150–400)
RBC: 3.96 MIL/uL (ref 3.87–5.11)
RDW: 12.8 % (ref 11.5–15.5)
WBC: 11.1 10*3/uL — ABNORMAL HIGH (ref 4.0–10.5)

## 2016-08-26 LAB — COMPREHENSIVE METABOLIC PANEL
ALT: 14 U/L (ref 14–54)
AST: 22 U/L (ref 15–41)
Albumin: 4.6 g/dL (ref 3.5–5.0)
Alkaline Phosphatase: 63 U/L (ref 38–126)
Anion gap: 7 (ref 5–15)
BUN: 7 mg/dL (ref 6–20)
CO2: 25 mmol/L (ref 22–32)
Calcium: 9.3 mg/dL (ref 8.9–10.3)
Chloride: 108 mmol/L (ref 101–111)
Creatinine, Ser: 0.84 mg/dL (ref 0.44–1.00)
GFR calc Af Amer: >60 mL/min (ref >60)
GFR calc non Af Amer: >60 mL/min (ref >60)
Glucose, Bld: 82 mg/dL (ref 65–99)
Potassium: 3.6 mmol/L (ref 3.5–5.1)
Sodium: 140 mmol/L (ref 135–145)
Total Bilirubin: 1 mg/dL (ref 0.3–1.2)
Total Protein: 7.2 g/dL (ref 6.5–8.1)

## 2016-08-26 LAB — URINALYSIS, ROUTINE W REFLEX MICROSCOPIC
Bilirubin Urine: NEGATIVE
Glucose, UA: NEGATIVE mg/dL
Ketones, ur: 20 mg/dL — AB
Leukocytes, UA: NEGATIVE
Nitrite: NEGATIVE
Protein, ur: NEGATIVE mg/dL
Specific Gravity, Urine: 1.019 (ref 1.005–1.030)
pH: 6 (ref 5.0–8.0)

## 2016-08-26 LAB — POC URINE PREG, ED: PREG TEST UR: NEGATIVE

## 2016-08-26 LAB — WET PREP, GENITAL
Sperm: NONE SEEN
Trich, Wet Prep: NONE SEEN
Yeast Wet Prep HPF POC: NONE SEEN

## 2016-08-26 MED ORDER — ONDANSETRON HCL 4 MG PO TABS: 4.0000 mg | ORAL_TABLET | Freq: Four times a day (QID) | ORAL | 0 refills | Status: DC

## 2016-08-26 MED ORDER — NAPROXEN 500 MG PO TABS
500.0000 mg | ORAL_TABLET | Freq: Two times a day (BID) | ORAL | 0 refills | Status: DC
Start: 2016-08-26 — End: 2016-12-26

## 2016-08-26 MED ORDER — KETOROLAC TROMETHAMINE 30 MG/ML IJ SOLN
30.0000 mg | Freq: Once | INTRAMUSCULAR | Status: AC
  Administered 2016-08-26: 30 mg via INTRAVENOUS
  Filled 2016-08-26: qty 1

## 2016-08-26 MED ORDER — METRONIDAZOLE 500 MG PO TABS: 500.0000 mg | ORAL_TABLET | Freq: Two times a day (BID) | ORAL | 0 refills | Status: DC

## 2016-08-26 MED ORDER — ONDANSETRON HCL 4 MG/2ML IJ SOLN
4.0000 mg | Freq: Once | INTRAMUSCULAR | Status: AC
  Administered 2016-08-26: 4 mg via INTRAVENOUS
  Filled 2016-08-26: qty 2

## 2016-08-26 MED ORDER — ACETAMINOPHEN 500 MG PO TABS: 500.0000 mg | ORAL_TABLET | Freq: Four times a day (QID) | ORAL | 0 refills | Status: DC | PRN

## 2016-08-26 NOTE — ED Triage Notes (Signed)
Per pt, states RLQ pain for 3 weeks-diagnosed with ovarian cyst-has not followed up with GYN

## 2016-08-26 NOTE — ED Provider Notes (Signed)
[facility]-EMERGENCY DEPT Provider Note   CSN: [id number] Arrival date & time: 08/26/16  1317     History   Chief Complaint Chief Complaint  Patient presents with  . Abdominal Pain    HPI [name] is a 31 y.o. female who was previously healthy who presents with a three-week history of right lower quadrant pain. Patient was evaluated on 08/12/2016 with a CT abdomen pelvis and found a 2 cm right ovarian cyst, no appendicitis. Patient was discharged home with Norco with recommendation of follow-up with OB/GYN. Patient has not followed up with OB/GYN but has ongoing pain, so she returned today. Patient has had intermittent sharp pains and has been worse over the past few days. She has been taking Norco, however she ran out. Patient also reports she took 400mg  ibuprofen one day and Aleve the other and had no relief. Patient reports she has had a few episodes of nausea and vomiting related to it. Patient has had urinary urgency. She denies any change in bowel movements. She denies any abnormal vaginal bleeding or discharge. Patient's LMP finished 08/19/2016. Patient denies any fevers, chest pain, shortness of breath.  HPI  History reviewed. No pertinent past medical history.  There are no active problems to display for this patient.   History reviewed. No pertinent surgical history.  OB History    Gravida Para Term Preterm AB Living   0             SAB TAB Ectopic Multiple Live Births                   Home Medications    Prior to Admission medications   Medication Sig Start Date End Date Taking? Authorizing Provider  etonogestrel (NEXPLANON) 68 MG IMPL implant 1 each by Subdermal route once. Placed 12/2013   Yes [provider]  ibuprofen (ADVIL,MOTRIN) 200 MG tablet Take 400 mg by mouth every 6 (six) hours as needed for fever, headache, mild pain, moderate pain or cramping.   Yes [provider]  naproxen sodium (ANAPROX) 220 MG tablet Take 440 mg by mouth every  12 (twelve) hours as needed (for pain).   Yes [provider]  acetaminophen (TYLENOL) 500 MG tablet Take 1 tablet (500 mg total) by mouth every 6 (six) hours as needed. 08/26/16   Emi Holes, PA-C  HYDROcodone-acetaminophen (NORCO) 5-325 MG tablet Take 1-2 tablets by mouth every 6 (six) hours as needed. Patient not taking: Reported on 08/26/2016 08/13/16   Geoffery Lyons, MD  metroNIDAZOLE (FLAGYL) 500 MG tablet Take 1 tablet (500 mg total) by mouth 2 (two) times daily. 08/26/16   Emi Holes, PA-C  naproxen (NAPROSYN) 500 MG tablet Take 1 tablet (500 mg total) by mouth 2 (two) times daily. 08/26/16   Teea Ducey, Waylan Boga, PA-C  ondansetron (ZOFRAN) 4 MG tablet Take 1 tablet (4 mg total) by mouth every 6 (six) hours. 08/26/16   Emi Holes, PA-C    Family History No family history on file.  Social History Social History  Substance Use Topics  . Smoking status: Current Every Day Smoker    Packs/day: 0.00    Types: Cigarettes  . Smokeless tobacco: Never Used  . Alcohol use Yes     Allergies   Aspirin   Review of Systems Review of Systems  Constitutional: Negative for chills and fever.  HENT: Negative for facial swelling and sore throat.   Respiratory: Negative for shortness of breath.   Cardiovascular: Negative  for chest pain.  Gastrointestinal: Positive for abdominal pain (RLQ), nausea and vomiting. Negative for diarrhea.  Genitourinary: Positive for urgency. Negative for dysuria.  Musculoskeletal: Negative for back pain.  Skin: Negative for rash and wound.  Neurological: Negative for headaches.  Psychiatric/Behavioral: The patient is not nervous/anxious.      Physical Exam Updated Vital Signs BP 108/65 (BP Location: Right Arm)   Pulse 61   Temp 98.4 F (36.9 C) (Oral)   Resp 18   LMP 08/19/2016   SpO2 100%   Physical Exam  Constitutional: She appears well-developed and well-nourished. No distress.  HENT:  Head: Normocephalic and atraumatic.    Mouth/Throat: Oropharynx is clear and moist. No oropharyngeal exudate.  Eyes: Pupils are equal, round, and reactive to light. Conjunctivae are normal. Right eye exhibits no discharge. Left eye exhibits no discharge. No scleral icterus.  Neck: Normal range of motion. Neck supple. No thyromegaly present.  Cardiovascular: Normal rate, regular rhythm, normal heart sounds and intact distal pulses.  Exam reveals no gallop and no friction rub.   No murmur heard. Pulmonary/Chest: Effort normal and breath sounds normal. No stridor. No respiratory distress. She has no wheezes. She has no rales.  Abdominal: Soft. Bowel sounds are normal. She exhibits no distension. There is tenderness in the right lower quadrant. There is tenderness at McBurney's point. There is no rebound, no guarding and no CVA tenderness.  Genitourinary: Pelvic exam was performed with patient supine. Cervix exhibits no motion tenderness. Right adnexum displays tenderness (repeoduces pain). Right adnexum displays no mass. Vaginal discharge (white) found.  Musculoskeletal: She exhibits no edema.  Lymphadenopathy:    She has no cervical adenopathy.  Neurological: She is alert. Coordination normal.  Skin: Skin is warm and dry. No rash noted. She is not diaphoretic. No pallor.  Psychiatric: She has a normal mood and affect.  Nursing note and vitals reviewed.    ED Treatments / Results  Labs (all labs ordered are listed, but only abnormal results are displayed) Labs Reviewed  WET PREP, GENITAL - Abnormal; Notable for the following:       Result Value   Clue Cells Wet Prep HPF POC PRESENT (*)    WBC, Wet Prep HPF POC MANY (*)    All other components within normal limits  URINALYSIS, ROUTINE W REFLEX MICROSCOPIC - Abnormal; Notable for the following:    Hgb urine dipstick SMALL (*)    Ketones, ur 20 (*)    Bacteria, UA FEW (*)    Squamous Epithelial / LPF 0-5 (*)    All other components within normal limits  CBC WITH  DIFFERENTIAL/PLATELET - Abnormal; Notable for the following:    WBC 11.1 (*)    All other components within normal limits  COMPREHENSIVE METABOLIC PANEL  RPR  HIV ANTIBODY (ROUTINE TESTING)  POC URINE PREG, ED  GC/CHLAMYDIA PROBE AMP ([place]) NOT AT [facility]    EKG  EKG Interpretation None       Radiology US Transvaginal Non-ob  Result Date: 08/26/2016 CLINICAL DATA:  Three-week history of right lower quadrant pain. EXAM: TRANSABDOMINAL AND TRANSVAGINAL ULTRASOUND OF PELVIS DOPPLER ULTRASOUND OF OVARIES TECHNIQUE: Both transabdominal and transvaginal ultrasound examinations of the pelvis were performed. Transabdominal technique was performed for global imaging of the pelvis including uterus, ovaries, adnexal regions, and pelvic cul-de-sac. It was necessary to proceed with endovaginal exam following the transabdominal exam to visualize the ovaries. Color and duplex Doppler ultrasound was utilized to evaluate blood flow to the ovaries. COMPARISON:  CT scan 08/13/2016. FINDINGS: Uterus Measurements: 7.6 x 4.0 x 4.6 cm. No fibroids or other mass visualized. Endometrium Thickness: Thickened and echogenic up to 19 mm diameter. Previous CT scan and also CT scan from 05/22/2008 showed prominent fluid in the endometrial canal. Right ovary Measurements: 3.6 x 2.5 x 2.7 cm. 2.5 cm simple cyst identified within the parenchyma. Left ovary Measurements: 2.1 x 0.8 x 1.2 cm. Normal appearance/no adnexal mass. Pulsed Doppler evaluation of both ovaries demonstrates normal low-resistance arterial and venous waveforms. Other findings No abnormal free fluid. IMPRESSION: 1. No evidence for ovarian mass or ovarian torsion. 2. Thickened endometrium/complex fluid in the endometrial canal. While this can certainly be physiologic in a patient of this age, a similar appearance was been seen on prior CT scans. Correlation for signs/ symptoms of cervical stenosis may prove helpful. Electronically Signed   By: Kennith Center  M.D.   On: 08/26/2016 19:25   US Pelvis Complete  Result Date: 08/26/2016 CLINICAL DATA:  Three-week history of right lower quadrant pain. EXAM: TRANSABDOMINAL AND TRANSVAGINAL ULTRASOUND OF PELVIS DOPPLER ULTRASOUND OF OVARIES TECHNIQUE: Both transabdominal and transvaginal ultrasound examinations of the pelvis were performed. Transabdominal technique was performed for global imaging of the pelvis including uterus, ovaries, adnexal regions, and pelvic cul-de-sac. It was necessary to proceed with endovaginal exam following the transabdominal exam to visualize the ovaries. Color and duplex Doppler ultrasound was utilized to evaluate blood flow to the ovaries. COMPARISON:  CT scan 08/13/2016. FINDINGS: Uterus Measurements: 7.6 x 4.0 x 4.6 cm. No fibroids or other mass visualized. Endometrium Thickness: Thickened and echogenic up to 19 mm diameter. Previous CT scan and also CT scan from 05/22/2008 showed prominent fluid in the endometrial canal. Right ovary Measurements: 3.6 x 2.5 x 2.7 cm. 2.5 cm simple cyst identified within the parenchyma. Left ovary Measurements: 2.1 x 0.8 x 1.2 cm. Normal appearance/no adnexal mass. Pulsed Doppler evaluation of both ovaries demonstrates normal low-resistance arterial and venous waveforms. Other findings No abnormal free fluid. IMPRESSION: 1. No evidence for ovarian mass or ovarian torsion. 2. Thickened endometrium/complex fluid in the endometrial canal. While this can certainly be physiologic in a patient of this age, a similar appearance was been seen on prior CT scans. Correlation for signs/ symptoms of cervical stenosis may prove helpful. Electronically Signed   By: Kennith Center M.D.   On: 08/26/2016 19:25   [place] Art/ven Flow Abd Pelv Doppler  Result Date: 08/26/2016 CLINICAL DATA:  Three-week history of right lower quadrant pain. EXAM: TRANSABDOMINAL AND TRANSVAGINAL ULTRASOUND OF PELVIS DOPPLER ULTRASOUND OF OVARIES TECHNIQUE: Both transabdominal and transvaginal  ultrasound examinations of the pelvis were performed. Transabdominal technique was performed for global imaging of the pelvis including uterus, ovaries, adnexal regions, and pelvic cul-de-sac. It was necessary to proceed with endovaginal exam following the transabdominal exam to visualize the ovaries. Color and duplex Doppler ultrasound was utilized to evaluate blood flow to the ovaries. COMPARISON:  CT scan 08/13/2016. FINDINGS: Uterus Measurements: 7.6 x 4.0 x 4.6 cm. No fibroids or other mass visualized. Endometrium Thickness: Thickened and echogenic up to 19 mm diameter. Previous CT scan and also CT scan from 05/22/2008 showed prominent fluid in the endometrial canal. Right ovary Measurements: 3.6 x 2.5 x 2.7 cm. 2.5 cm simple cyst identified within the parenchyma. Left ovary Measurements: 2.1 x 0.8 x 1.2 cm. Normal appearance/no adnexal mass. Pulsed Doppler evaluation of both ovaries demonstrates normal low-resistance arterial and venous waveforms. Other findings No abnormal free fluid. IMPRESSION: 1. No evidence  for ovarian mass or ovarian torsion. 2. Thickened endometrium/complex fluid in the endometrial canal. While this can certainly be physiologic in a patient of this age, a similar appearance was been seen on prior CT scans. Correlation for signs/ symptoms of cervical stenosis may prove helpful. Electronically Signed   By: Kennith Center M.D.   On: 08/26/2016 19:25    Procedures Procedures (including critical care time)  Medications Ordered in ED Medications  ketorolac (TORADOL) 30 MG/ML injection 30 mg (30 mg Intravenous Given 08/26/16 1759)  ondansetron (ZOFRAN) injection 4 mg (4 mg Intravenous Given 08/26/16 1759)     Initial Impression / Assessment and Plan / ED Course  I have reviewed the triage vital signs and the nursing notes.  Pertinent labs & imaging results that were available during my care of the patient were reviewed by me and considered in my medical decision making (see chart  for details).     Patient with persistent right ovarian cyst, no torsion on pelvic ultrasound today. Ultrasound also shows thickened endometrium/complex fluid in the endometrial canal. CBC shows WBC 11.1. CMP unremarkable. Urine pregnancy negative. GC/chlamydia, HIV, RPR sent. Wet prep shows clue cells. Urinalysis shows small hematuria, 20 ketones, few bacteria. We'll treat BV with Flagyl. We'll discharge home with Naprosyn, Tylenol, Zofran for symptomatic treatment. Follow-up to OB/GYN for further evaluation and treatment. Return precautions discussed. Patient understands and agrees with plan. Patient vitals stable throughout ED course and discharged in satisfactory condition.  Final Clinical Impressions(s) / ED Diagnoses   Final diagnoses:  Cyst of right ovary  BV (bacterial vaginosis)    New Prescriptions New Prescriptions   ACETAMINOPHEN (TYLENOL) 500 MG TABLET    Take 1 tablet (500 mg total) by mouth every 6 (six) hours as needed.   METRONIDAZOLE (FLAGYL) 500 MG TABLET    Take 1 tablet (500 mg total) by mouth 2 (two) times daily.   NAPROXEN (NAPROSYN) 500 MG TABLET    Take 1 tablet (500 mg total) by mouth 2 (two) times daily.   ONDANSETRON (ZOFRAN) 4 MG TABLET    Take 1 tablet (4 mg total) by mouth every 6 (six) hours.     Emi Holes, PA-C 08/26/16 2047    Alvira Monday, MD 08/31/16 1034

## 2016-08-26 NOTE — Discharge Instructions (Signed)
Medications: Naprosyn, Tylenol, Zofran  Treatment: Take Naprosyn as prescribed twice daily. You can alternate with Tylenol every 6 hours as needed. Take Zofran every 6 hours as needed for nausea or vomiting. You may find that a heating pad on the area will be helpful. Alternate 15 minutes on, 20 minutes off.  Follow-up: Please follow-up with the women's outpatient clinic for further evaluation and treatment of your ovarian cyst and thickened cervix found on ultrasound today. Please return to the emergency department if you develop any new or worsening symptoms including fever, intractable vomiting, or any other new or concerning symptoms.

## 2016-08-27 LAB — GC/CHLAMYDIA PROBE AMP (~~LOC~~) NOT AT ARMC
Chlamydia: NEGATIVE
Neisseria Gonorrhea: NEGATIVE

## 2016-08-28 LAB — HIV ANTIBODY (ROUTINE TESTING W REFLEX): HIV Screen 4th Generation wRfx: NONREACTIVE

## 2016-08-28 LAB — RPR: RPR Ser Ql: NONREACTIVE

## 2016-09-19 ENCOUNTER — Encounter: Payer: Self-pay | Admitting: Obstetrics and Gynecology

## 2016-09-19 ENCOUNTER — Ambulatory Visit (INDEPENDENT_AMBULATORY_CARE_PROVIDER_SITE_OTHER): Payer: No Typology Code available for payment source | Admitting: Obstetrics and Gynecology

## 2016-09-19 VITALS — BP 101/72 | HR 72 | Wt 122.6 lb

## 2016-09-19 DIAGNOSIS — G8929 Other chronic pain: Secondary | ICD-10-CM

## 2016-09-19 DIAGNOSIS — Z720 Tobacco use: Secondary | ICD-10-CM

## 2016-09-19 DIAGNOSIS — R1031 Right lower quadrant pain: Secondary | ICD-10-CM

## 2016-09-19 DIAGNOSIS — N939 Abnormal uterine and vaginal bleeding, unspecified: Secondary | ICD-10-CM | POA: Diagnosis not present

## 2016-09-19 DIAGNOSIS — Z1151 Encounter for screening for human papillomavirus (HPV): Secondary | ICD-10-CM | POA: Diagnosis not present

## 2016-09-19 DIAGNOSIS — Z975 Presence of (intrauterine) contraceptive device: Secondary | ICD-10-CM | POA: Diagnosis not present

## 2016-09-19 DIAGNOSIS — Z124 Encounter for screening for malignant neoplasm of cervix: Secondary | ICD-10-CM | POA: Diagnosis not present

## 2016-09-19 LAB — POCT PREGNANCY, URINE: Preg Test, Ur: NEGATIVE

## 2016-09-19 MED ORDER — NORETHIN ACE-ETH ESTRAD-FE 1-20 MG-MCG(24) PO TABS: 1.0000 | ORAL_TABLET | Freq: Every day | ORAL | 0 refills | Status: DC

## 2016-09-19 MED ORDER — KETOROLAC TROMETHAMINE 10 MG PO TABS: 10.0000 mg | ORAL_TABLET | Freq: Four times a day (QID) | ORAL | 2 refills | Status: DC | PRN

## 2016-09-19 NOTE — Progress Notes (Signed)
Obstetrics and Gynecology New Patient Evaluation  Appointment Date: 09/19/2016  OBGYN Clinic: [facility]  Primary Care Provider: Patient, No Pcp Per  Referring Provider: ED  Chief Complaint: RLQ pain and AUB  History of Present Illness: Dominique Webb is a 31 y.o. African-American G0 (No LMP recorded. Patient has had an implant.), seen for the above chief complaint. Her past medical history is significant for tobacco abuse.   Patient went to ED on 8/6 for abdominal pain and had negative UPT, UCx and other labs and a CT that only showed a 2.2cm RO cyst, no FF in the pelvis. She re-presented to ED on 8/20 for abdominal pain and had negative blood work, UPT, wet prep, GC-CT and u/a and an u/s that only showed the 2.5cm RO cyst (simple).  Patient states that she had her nexplanon placed in December 2015 and has been amenorheic with it until early august when she had VB and the abdominal pain.  She states the VB has decreased and she has 4-5 pad or tampon bleeding per day (not heavy, changing just to be clean) and still has occasional RLQ sharp pain that doesn't radiate and feels somewhat different than her period pains.   No nausea, vomiting, diarrhea, dysuria, constipation, blood in BMs  Review of Systems:  as noted in the History of Present Illness.  Past Medical History:  No past medical history on file.  Past Surgical History:  No past surgical history on file.  Past Obstetrical History:  OB History  Gravida Para Term Preterm AB Living  0            SAB TAB Ectopic Multiple Live Births                  Past Gynecological History: As per HPI. History of Pap Smear(s): Yes.   Last pap 3 years ago, which was negative History of STI(s): Yes.   She is currently using Nexplanon for contraception (placed 12/2013)  Social History:  Social History   Social History  . Marital status: Single    Spouse name: N/A  . Number of children:  N/A  . Years of education: N/A   Occupational History  . Not on file.   Social History Main Topics  . Smoking status: Current Every Day Smoker    Packs/day: 0.00    Types: Cigarettes  . Smokeless tobacco: Never Used  . Alcohol use Yes  . Drug use: No  . Sexual activity: Yes    Birth control/ protection: Implant   Other Topics Concern  . Not on file   Social History Narrative  . No narrative on file    Family History: No family history on file. She denies any female cancers, bleeding or blood clotting disorders.   Medications Ms. Fentress had no medications administered during this visit. Current Outpatient Prescriptions  Medication Sig Dispense Refill  . etonogestrel (NEXPLANON) 68 MG IMPL implant 1 each by Subdermal route once. Placed 12/2013    . acetaminophen (TYLENOL) 500 MG tablet Take 1 tablet (500 mg total) by mouth every 6 (six) hours as needed. 30 tablet 0   No current facility-administered medications for this visit.     Allergies Aspirin   Physical Exam:  BP 101/72   Pulse 72   Wt 122 lb 9.6 oz (55.6 kg)   BMI 21.72 kg/m  Body mass index is 21.72 kg/m. General appearance: Well nourished, well developed female in no acute distress.  Cardiovascular: normal s1 and s2.  No murmurs, rubs or gallops. Respiratory:  Clear to auscultation bilateral. Normal respiratory effort Abdomen: positive bowel sounds and no masses, hernias; diffusely non tender to palpation, non distended Neuro/Psych:  Normal mood and affect.  Skin:  Warm and dry.  Lymphatic:  No inguinal lymphadenopathy.   Pelvic exam: is not limited by body habitus EGBUS: within normal limits, Vagina: within normal limits and with no blood or discharge in the vault, Cervix: normal appearing cervix without tenderness, discharge or lesions. Uterus:  nonenlarged and non tender and Adnexa:  normal adnexa and no mass, fullness, tenderness Rectovaginal: deferred  Laboratory: UPT neg  Radiology:  CLINICAL  DATA:  Three-week history of right lower quadrant pain.  EXAM: TRANSABDOMINAL AND TRANSVAGINAL ULTRASOUND OF PELVIS  DOPPLER ULTRASOUND OF OVARIES  TECHNIQUE: Both transabdominal and transvaginal ultrasound examinations of the pelvis were performed. Transabdominal technique was performed for global imaging of the pelvis including uterus, ovaries, adnexal regions, and pelvic cul-de-sac.  It was necessary to proceed with endovaginal exam following the transabdominal exam to visualize the ovaries. Color and duplex Doppler ultrasound was utilized to evaluate blood flow to the ovaries.  COMPARISON:  CT scan 08/13/2016.  FINDINGS: Uterus  Measurements: 7.6 x 4.0 x 4.6 cm. No fibroids or other mass visualized.  Endometrium  Thickness: Thickened and echogenic up to 19 mm diameter. Previous CT scan and also CT scan from 05/22/2008 showed prominent fluid in the endometrial canal.  Right ovary  Measurements: 3.6 x 2.5 x 2.7 cm. 2.5 cm simple cyst identified within the parenchyma.  Left ovary  Measurements: 2.1 x 0.8 x 1.2 cm. Normal appearance/no adnexal mass.  Pulsed Doppler evaluation of both ovaries demonstrates normal low-resistance arterial and venous waveforms.  Other findings  No abnormal free fluid.  IMPRESSION: 1. No evidence for ovarian mass or ovarian torsion. 2. Thickened endometrium/complex fluid in the endometrial canal. While this can certainly be physiologic in a patient of this age, a similar appearance was been seen on prior CT scans. Correlation for signs/ symptoms of cervical stenosis may prove helpful.   Electronically Signed   By: Kennith CenterEric  Mansell M.D.   On: 08/26/2016 19:25  CLINICAL DATA:  Initial evaluation for acute lower abdominal pain for 3 days, nausea, vomiting.  EXAM: CT ABDOMEN AND PELVIS WITH CONTRAST  TECHNIQUE: Multidetector CT imaging of the abdomen and pelvis was performed using the standard protocol  following bolus administration of intravenous contrast.  CONTRAST:  100mL ISOVUE-300 IOPAMIDOL (ISOVUE-300) INJECTION 61%  COMPARISON:  Prior CT from 05/22/2008.  FINDINGS: Lower chest: Visualized lung bases are clear.  Hepatobiliary: Few scattered subcentimeter hypodensities noted within liver, too small the characterize, but of doubtful significance. Liver otherwise unremarkable. Gallbladder within normal limits. No biliary dilatation.  Pancreas: Pancreas within normal limits.  Spleen: Spleen within normal limits.  Adrenals/Urinary Tract: Adrenal glands are normal. Kidneys equal in size with symmetric enhancement. No nephrolithiasis, hydronephrosis, or focal enhancing renal mass. No hydroureter. Bladder within normal limits.  Stomach/Bowel: Stomach within normal limits. No evidence for bowel obstruction. Appendix is normal. No abnormal wall thickening, mucosal enhancement, or inflammatory fat stranding seen about the bowels.  Vascular/Lymphatic: Normal intravascular enhancement seen throughout the intra-abdominal aorta and its branch vessels. No adenopathy.  Reproductive: Prominent fluid within the endometrial canal at the level of the lower uterine segment, of uncertain significance, but similar relative to previous exam from 2010. Uterus otherwise unremarkable. Left ovary within normal limits. 2.2 cm right ovarian cyst, most consistent with  a normal physiologic cyst.  Other: No free air or fluid.  Musculoskeletal: No acute osseus abnormality. No worrisome lytic or blastic osseous lesions. Sclerotic lesion involving the left iliac wing similar to previous, likely benign in nature.  IMPRESSION: 1. No CT evidence for acute intra-abdominal or pelvic process. 2. Normal appendix. 3. 2.2 cm right ovarian cyst, most consistent with a normal physiologic cyst. 4. Prominent fluid within the endometrial canal is above, of uncertain significance, but grossly  similar relative to prior CT from 05/22/2008.   Electronically Signed   By: Rise Mu M.D.   On: 08/13/2016 03:31  Assessment: pt stable  Plan:  1. Abnormal uterine bleeding (AUB) D/w pt that as the nexplanon wears off it, it is common that the amenorrhea effect can go away, with her still being protected against pregnancy and this is likely what is happening. Looking at her u/s, it appears she had a trilaminar stripe and so was likely mid cycle. Pt used OCPs in the past and r/b d/w her and she is amenable to using them until she can get the nexplanon taken out. I told her that if she likes OCPs after removal of the nexplanon, she can continue on it. Pt told to either schedule removal with [place] or with the HD. Rx for loestrin and toradol given. Pap updated today - Cytology - PAP  RTC prn  Cornelia Copa MD Attending Center for [facility])

## 2016-09-25 LAB — CYTOLOGY - PAP
Diagnosis: UNDETERMINED — AB
HPV (WINDOPATH): DETECTED — AB
HPV 16/18/45 genotyping: NEGATIVE

## 2016-09-26 ENCOUNTER — Encounter: Payer: Self-pay | Admitting: Obstetrics and Gynecology

## 2016-09-26 DIAGNOSIS — R87610 Atypical squamous cells of undetermined significance on cytologic smear of cervix (ASC-US): Secondary | ICD-10-CM | POA: Insufficient documentation

## 2016-10-01 ENCOUNTER — Telehealth: Payer: Self-pay | Admitting: *Deleted

## 2016-10-01 ENCOUNTER — Other Ambulatory Visit: Payer: Self-pay

## 2016-10-01 NOTE — Telephone Encounter (Signed)
Message to front desk to set patient up for colpo.

## 2016-10-01 NOTE — Telephone Encounter (Signed)
-----   Message from Gaston Bing, MD sent at 09/26/2016  2:45 PM EDT ----- Please call her and set her up for a colpo. thanks

## 2016-10-04 ENCOUNTER — Telehealth: Payer: Self-pay | Admitting: General Practice

## 2016-10-04 NOTE — Telephone Encounter (Signed)
-----   Message from Cheryl A ClintonVivien Rota/2018  1:32 PM EDT ----- His first appointment   ----- Message ----- From: Mannie Stabile, LPN Sent: 1/61/0960   9:27 AM To: [facility] Admin Pool  Per Dr. Vergie Living please set patient up for a colposcopy with him. Please send message back to clinical pool and we will inform patient.   Thanks,   Marchelle Folks

## 2016-10-04 NOTE — Telephone Encounter (Signed)
Called patient & informed her of results & recommendations. Explained colposcopy to patient. Patient verbalized understanding & states she has had one of those before. Informed patient of appt. Patient states she is unable to make an appt at that time and would like it rescheduled. Told patient I cannot make an appt but will let the front office staff know and they will contact her with a new appt. Patient verbalized understanding & had no questions

## 2016-10-09 ENCOUNTER — Other Ambulatory Visit: Payer: Self-pay

## 2016-10-09 NOTE — Progress Notes (Signed)
Sprintec BC sent to Pharmacy

## 2016-10-10 ENCOUNTER — Other Ambulatory Visit: Payer: Self-pay

## 2016-10-10 MED ORDER — NORGESTIMATE-ETH ESTRADIOL 0.25-35 MG-MCG PO TABS: 1.0000 | ORAL_TABLET | Freq: Every day | ORAL | 0 refills | Status: DC

## 2016-11-13 ENCOUNTER — Ambulatory Visit: Payer: No Typology Code available for payment source | Admitting: Obstetrics and Gynecology

## 2016-11-13 NOTE — Progress Notes (Signed)
Patient no showed for appointment on 11/13/16. Called patient to inform her of missed appointment, got vm. Left patient a message to call the office to reschedule appointment. Letter was mailed to patient.

## 2016-12-26 ENCOUNTER — Encounter: Payer: Self-pay | Admitting: Obstetrics and Gynecology

## 2016-12-26 ENCOUNTER — Other Ambulatory Visit (HOSPITAL_COMMUNITY)
Admission: RE | Admit: 2016-12-26 | Discharge: 2016-12-26 | Disposition: A | Discharge status: Home / Self Care | Payer: BLUE CROSS/BLUE SHIELD | Source: Ambulatory Visit | Attending: Obstetrics and Gynecology | Admitting: Obstetrics and Gynecology

## 2016-12-26 ENCOUNTER — Ambulatory Visit (INDEPENDENT_AMBULATORY_CARE_PROVIDER_SITE_OTHER): Payer: BLUE CROSS/BLUE SHIELD | Admitting: Obstetrics and Gynecology

## 2016-12-26 VITALS — BP 106/64 | HR 52 | Ht 62.0 in | Wt 125.7 lb

## 2016-12-26 DIAGNOSIS — R87610 Atypical squamous cells of undetermined significance on cytologic smear of cervix (ASC-US): Secondary | ICD-10-CM | POA: Diagnosis not present

## 2016-12-26 DIAGNOSIS — Z3202 Encounter for pregnancy test, result negative: Secondary | ICD-10-CM | POA: Diagnosis not present

## 2016-12-26 LAB — POCT PREGNANCY, URINE: Preg Test, Ur: NEGATIVE

## 2016-12-26 NOTE — Procedures (Signed)
Colposcopy Procedure Note  Pre-operative Diagnosis: ASCUS/HPV positive  Post-operative Diagnosis: Same  Procedure Details  Patient states she has h/o colpo but no surgeries on her cervix. Patient states pap 3 years ago was negative when she got her nexplanon.   UPT negative    The risks (including infection, bleeding, pain) and benefits of the procedure were explained to the patient and written informed consent was obtained.  The patient was placed in the dorsal lithotomy position. A Graves was speculum inserted in the vagina, and the cervix was visualized.  AA staining done Lugol's with green filter.  Biopsy not taken and then single toothed tenaculum applied and ECC in all four quadrants done. No bleeding after procedure  Findings: normal appearing nulliparous appearing cervix. Inadequate colposcopy. Vaginal vault normal  Adequate: no  Specimens: ECC  Condition: Stable  Complications: None  Plan: The patient was advised to call for any fever or for prolonged or severe pain or bleeding. She was advised to use OTC analgesics as needed for mild to moderate pain. She was advised to avoid vaginal intercourse for 48 hours or until the bleeding has completely stopped.  Patient to make nexplanon removal appt when she checks out and she thinks she wants another one   Cornelia Copaharlie Elya Diloreto, Jr MD Attending Center [facility](Faculty Practice)

## 2017-01-01 ENCOUNTER — Telehealth: Payer: Self-pay | Admitting: Obstetrics and Gynecology

## 2017-01-01 NOTE — Telephone Encounter (Signed)
GYN Telephone Note Patient called at [phone] and generic VM picked up. VM left asking her to call the clinic re: her last visit. If patient calls clinic, please let her know that her ECC was negative and she just needs another pap smear in one year.   Cornelia Copaharlie Wolfgang Finigan, Jr MD Attending [facility] (Faculty Practice) 01/01/2017 Time: [id number]

## 2017-01-08 ENCOUNTER — Encounter: Payer: Self-pay | Admitting: General Practice

## 2017-01-13 ENCOUNTER — Telehealth: Payer: Self-pay | Admitting: General Practice

## 2017-01-13 NOTE — Telephone Encounter (Signed)
Patient called and left message on nurse line requesting results from 12/20 colposcopy. Also states she is returning a missed call from Dr Vergie LivingPickens. Patient left call back number [phone]. Per Dr Vergie LivingPickens telephone note, patient's ECC was negative & needs a pap with cotesting in one year. Called patient, no answer- left message on VM stating we are trying to reach you to return your phone call, please call us back

## 2017-01-14 ENCOUNTER — Encounter: Payer: Self-pay | Admitting: *Deleted

## 2017-01-15 ENCOUNTER — Encounter: Payer: Self-pay | Admitting: General Practice

## 2017-01-15 ENCOUNTER — Ambulatory Visit (INDEPENDENT_AMBULATORY_CARE_PROVIDER_SITE_OTHER): Payer: BLUE CROSS/BLUE SHIELD | Admitting: Obstetrics and Gynecology

## 2017-01-15 VITALS — BP 103/60 | HR 59 | Wt 122.3 lb

## 2017-01-15 DIAGNOSIS — Z3046 Encounter for surveillance of implantable subdermal contraceptive: Secondary | ICD-10-CM | POA: Diagnosis not present

## 2017-01-15 DIAGNOSIS — Z3042 Encounter for surveillance of injectable contraceptive: Secondary | ICD-10-CM | POA: Diagnosis not present

## 2017-01-15 DIAGNOSIS — Z30013 Encounter for initial prescription of injectable contraceptive: Secondary | ICD-10-CM

## 2017-01-15 MED ORDER — MEDROXYPROGESTERONE ACETATE 150 MG/ML IM SUSP
150.0000 mg | Freq: Once | INTRAMUSCULAR | Status: AC
  Administered 2017-01-15: 150 mg via INTRAMUSCULAR

## 2017-01-15 NOTE — Procedures (Signed)
Nexplanon Removal Procedure Note Prior to the procedure being performed, the patient (or guardian) was asked to state their full name, date of birth, type of procedure being performed and the exact location of the operative site. This information was then checked against the documentation in the patient's chart. Prior to the procedure being performed, a "time out" was performed by the physician that confirmed the correct patient, procedure and site. Patient desires removal b/c it just expired and would like to do depo provera.  After informed consent was obtained, the patient's LUE examined and nexplanon near the old insertion site. Old insertion site was cleaned with alcohol and then 3mL of lido with epi was injected underneath the distal tip. An 11 blade used to open up old incision and then tip was easily brought to incision. Capsule then scrapped off and nexplanon easily removed and noted to be intact. Dressing place and depo provera given. Pt told to consider it effective in one week.   The patient tolerated the procedure well.  Cornelia Copaharlie Emmauel Hallums, Jr MD Attending Center [facility](Faculty Practice)

## 2017-01-15 NOTE — Addendum Note (Signed)
Addended by: Garret ReddishBARNES, Corry Ihnen M on: 01/15/2017 05:14 PM   Modules accepted: Orders

## 2017-01-17 NOTE — Telephone Encounter (Signed)
Called Pt no answer,left message for her to call back regarding colpo results.

## 2017-01-28 ENCOUNTER — Encounter: Payer: Self-pay | Admitting: *Deleted

## 2017-04-15 ENCOUNTER — Ambulatory Visit: Payer: BLUE CROSS/BLUE SHIELD

## 2017-04-22 ENCOUNTER — Ambulatory Visit (INDEPENDENT_AMBULATORY_CARE_PROVIDER_SITE_OTHER): Payer: BLUE CROSS/BLUE SHIELD | Admitting: *Deleted

## 2017-04-22 ENCOUNTER — Encounter: Payer: Self-pay | Admitting: Family Medicine

## 2017-04-22 VITALS — BP 112/51 | HR 71 | Wt 127.9 lb

## 2017-04-22 DIAGNOSIS — Z3202 Encounter for pregnancy test, result negative: Secondary | ICD-10-CM

## 2017-04-22 DIAGNOSIS — Z3042 Encounter for surveillance of injectable contraceptive: Secondary | ICD-10-CM

## 2017-04-22 LAB — POCT PREGNANCY, URINE: PREG TEST UR: NEGATIVE

## 2017-04-22 MED ORDER — MEDROXYPROGESTERONE ACETATE 150 MG/ML IM SUSP
150.0000 mg | Freq: Once | INTRAMUSCULAR | Status: AC
  Administered 2017-04-22: 150 mg via INTRAMUSCULAR

## 2017-04-22 NOTE — Progress Notes (Signed)
Depo Provera 150 mg given IM as scheduled following negative UPT.  Next dose due 7/2-7/16.

## 2017-04-24 NOTE — Progress Notes (Signed)
I was present at the time of this nurse visit.  Agree with nursing documentation.   Julie P. Degele, MD OB Fellow   

## 2017-06-16 DIAGNOSIS — L668 Other cicatricial alopecia: Secondary | ICD-10-CM | POA: Diagnosis not present

## 2017-06-16 DIAGNOSIS — L259 Unspecified contact dermatitis, unspecified cause: Secondary | ICD-10-CM | POA: Diagnosis not present

## 2017-07-08 ENCOUNTER — Ambulatory Visit (INDEPENDENT_AMBULATORY_CARE_PROVIDER_SITE_OTHER): Payer: BLUE CROSS/BLUE SHIELD

## 2017-07-08 ENCOUNTER — Other Ambulatory Visit: Payer: Self-pay

## 2017-07-08 DIAGNOSIS — Z3042 Encounter for surveillance of injectable contraceptive: Secondary | ICD-10-CM | POA: Diagnosis not present

## 2017-07-08 MED ORDER — MEDROXYPROGESTERONE ACETATE 150 MG/ML IM SUSP
150.0000 mg | Freq: Once | INTRAMUSCULAR | Status: AC
  Administered 2017-07-08: 150 mg via INTRAMUSCULAR

## 2017-07-08 NOTE — Progress Notes (Signed)
Date last pap: 09/19/2016. Last Depo-Provera:04/22/2017. Side Effects if any: n/a. Serum HCG indicated? n/a. Depo-Provera 150 mg IM given by: Darrol AngelShannon Samiya Mervin, CMA(AAMA). Next appointment due Sept 17-Oct 1.

## 2017-09-23 ENCOUNTER — Ambulatory Visit: Payer: BLUE CROSS/BLUE SHIELD

## 2017-09-25 ENCOUNTER — Ambulatory Visit (INDEPENDENT_AMBULATORY_CARE_PROVIDER_SITE_OTHER): Payer: BLUE CROSS/BLUE SHIELD | Admitting: General Practice

## 2017-09-25 ENCOUNTER — Encounter: Payer: Self-pay | Admitting: Family Medicine

## 2017-09-25 VITALS — BP 106/55 | HR 68 | Ht 62.0 in | Wt 132.0 lb

## 2017-09-25 DIAGNOSIS — Z23 Encounter for immunization: Secondary | ICD-10-CM | POA: Diagnosis not present

## 2017-09-25 DIAGNOSIS — Z3042 Encounter for surveillance of injectable contraceptive: Secondary | ICD-10-CM

## 2017-09-25 MED ORDER — MEDROXYPROGESTERONE ACETATE 150 MG/ML IM SUSP
150.0000 mg | Freq: Once | INTRAMUSCULAR | Status: AC
  Administered 2017-09-25: 150 mg via INTRAMUSCULAR

## 2017-09-25 NOTE — Progress Notes (Signed)
Patient seen and assessed by nursing staff.  Agree with documentation and plan.  

## 2017-09-25 NOTE — Progress Notes (Signed)
[name] here for Depo-Provera  Injection.  Injection administered without complication. Patient will return in 3 months for next injection. Patient also requested the flu vaccine while here. Injection given without complication.  [name], RN 09/25/2017  1:27 PM

## 2017-12-11 ENCOUNTER — Ambulatory Visit (INDEPENDENT_AMBULATORY_CARE_PROVIDER_SITE_OTHER): Payer: BLUE CROSS/BLUE SHIELD

## 2017-12-11 VITALS — Wt 134.9 lb

## 2017-12-11 DIAGNOSIS — Z3042 Encounter for surveillance of injectable contraceptive: Secondary | ICD-10-CM

## 2017-12-11 MED ORDER — MEDROXYPROGESTERONE ACETATE 150 MG/ML IM SUSP
150.0000 mg | Freq: Once | INTRAMUSCULAR | Status: AC
  Administered 2017-12-11: 150 mg via INTRAMUSCULAR

## 2017-12-11 NOTE — Progress Notes (Signed)
Dominique NimsLeteshia R Webb here for Depo-Provera  Injection.  Injection administered without complication. Patient will return in 3 months for next injection.  Henrietta Dineamela S Markon Jares, CMA 12/11/2017  11:29 AM

## 2017-12-15 NOTE — Progress Notes (Signed)
I have reviewed the chart and agree with nursing staff's documentation of this patient's encounter.  Norman Bier, MD 12/15/2017 1:05 PM    

## 2018-02-26 ENCOUNTER — Ambulatory Visit: Payer: BLUE CROSS/BLUE SHIELD

## 2018-03-03 ENCOUNTER — Encounter: Payer: Self-pay | Admitting: Obstetrics and Gynecology

## 2018-03-03 ENCOUNTER — Ambulatory Visit (INDEPENDENT_AMBULATORY_CARE_PROVIDER_SITE_OTHER): Payer: BLUE CROSS/BLUE SHIELD

## 2018-03-03 VITALS — BP 120/61 | HR 75 | Wt 148.0 lb

## 2018-03-03 DIAGNOSIS — Z3042 Encounter for surveillance of injectable contraceptive: Secondary | ICD-10-CM | POA: Diagnosis not present

## 2018-03-03 MED ORDER — MEDROXYPROGESTERONE ACETATE 150 MG/ML IM SUSP
150.0000 mg | Freq: Once | INTRAMUSCULAR | Status: AC
  Administered 2018-03-03: 150 mg via INTRAMUSCULAR

## 2018-03-03 NOTE — Progress Notes (Addendum)
[name] here for Depo-Provera  Injection.  Injection administered without complication. Patient will return in 3 months for next injection.  Ralene Bathe, RN 03/03/2018  3:38 PM   Reviewed the documentation.  I agree with the plan of care.  Nolene Bernheim, RN, MSN, NP-BC Nurse Practitioner, [facility] for [employer], [facility] 03/04/2018 4:33 PM

## 2018-04-09 ENCOUNTER — Encounter (HOSPITAL_COMMUNITY): Payer: Self-pay

## 2018-04-09 ENCOUNTER — Ambulatory Visit (HOSPITAL_COMMUNITY)
Admission: EM | Admit: 2018-04-09 | Discharge: 2018-04-09 | Disposition: A | Discharge status: Home / Self Care | Payer: BLUE CROSS/BLUE SHIELD | Attending: Family Medicine | Admitting: Family Medicine

## 2018-04-09 ENCOUNTER — Other Ambulatory Visit: Payer: Self-pay

## 2018-04-09 DIAGNOSIS — R42 Dizziness and giddiness: Secondary | ICD-10-CM

## 2018-04-09 DIAGNOSIS — H6121 Impacted cerumen, right ear: Secondary | ICD-10-CM | POA: Diagnosis not present

## 2018-04-09 LAB — POCT URINALYSIS DIP (DEVICE)
Bilirubin Urine: NEGATIVE
Glucose, UA: NEGATIVE mg/dL
Ketones, ur: NEGATIVE mg/dL
Leukocytes,Ua: NEGATIVE
Nitrite: NEGATIVE
Protein, ur: NEGATIVE mg/dL
Specific Gravity, Urine: 1.025 (ref 1.005–1.030)
Urobilinogen, UA: 0.2 mg/dL (ref 0.0–1.0)
pH: 5.5 (ref 5.0–8.0)

## 2018-04-09 LAB — POCT PREGNANCY, URINE: Preg Test, Ur: NEGATIVE

## 2018-04-09 LAB — GLUCOSE, CAPILLARY: Glucose-Capillary: 75 mg/dL (ref 70–99)

## 2018-04-09 MED ORDER — MECLIZINE HCL 25 MG PO TABS: 25.0000 mg | ORAL_TABLET | Freq: Three times a day (TID) | ORAL | 0 refills | Status: DC | PRN

## 2018-04-09 MED ORDER — CARBAMIDE PEROXIDE 6.5 % OT SOLN: 5.0000 [drp] | Freq: Two times a day (BID) | OTIC | 0 refills | Status: AC

## 2018-04-09 NOTE — ED Triage Notes (Signed)
Patient presents to Urgent Care with complaints of dizziness on and off since yesterday. Patient states she has been well hydrated, is not diabetic but it does run in her family. Pt states she normally does not have periods because of her birth control, but recently she has been having heavy bleeding, pt has gone through 4 pads today.

## 2018-04-09 NOTE — ED Notes (Signed)
Patient verbalizes understanding of discharge instructions. Opportunity for questioning and answers were provided. Patient discharged from UCC by RN.  

## 2018-04-09 NOTE — Discharge Instructions (Addendum)
Debrox ear drops prescribed. Use as instructed Your blood sugar was within normal limits Urine with large blood, which may be related to your menstrual cycle.  Urine did show possible slight dehydration.  Increase your fluids and drink approximately half your body weight in ounces.   Meclizine prescribed.  Take as directed for symptomatic relief.  This medication may make you drowsy so use with cautions while driving or operating heavy machinery. Follow up with Joaquin Courts FNP as needed if symptoms persists Return or go to the ER if you have any new or worsening symptoms fever, chills, nausea, vomiting, hearing changes, ringing in ear, ear pain, chest pain, passing out, shortness of breath, weakness, slurred speech, memory or emotional changes, facial drooping/ asymmetry, incoordination, numbness or tingling, abdominal pain, changes in bowel or bladder habits, etc..Marland Kitchen

## 2018-04-09 NOTE — ED Provider Notes (Signed)
[facility] CARE CENTER   [id number] 04/09/18 Arrival Time: 1754  CC: DIZZINESS  SUBJECTIVE:  Dominique Webb is a 33 y.o. female who presents with complaint of dizziness that began 2 days ago.  Denies a precipitating event, trauma, or recent URI within the past month.  Describes the dizziness as "the room spinning."  States that it is intermittent with episodes lasting 5 minutes.  Has tried drinking water and resting with relief.  Symptoms made worse with position changes.  Denies previous symptoms.  Had an episodes of feeling "jettery" earlier today after eating.  Denies fever, chills, nausea, vomiting, hearing changes, tinnitus, ear pain, chest pain, syncope, SOB, weakness, slurred speech, memory or emotional changes, facial drooping/ asymmetry, incoordination, numbness or tingling, abdominal pain, changes in bowel or bladder habits.    ROS: As per HPI. History reviewed. No pertinent past medical history. History reviewed. No pertinent surgical history. Allergies  Allergen Reactions  . Aspirin Other (See Comments)    "stomach bleeds"   No current facility-administered medications on file prior to encounter.    Current Outpatient Medications on File Prior to Encounter  Medication Sig Dispense Refill  . acetaminophen (TYLENOL) 500 MG tablet Take 1 tablet (500 mg total) by mouth every 6 (six) hours as needed. 30 tablet 0  . ibuprofen (ADVIL,MOTRIN) 200 MG tablet Take 400 mg by mouth every 6 (six) hours as needed for fever, headache, mild pain, moderate pain or cramping.     Social History   Socioeconomic History  . Marital status: Single    Spouse name: Not on file  . Number of children: Not on file  . Years of education: Not on file  . Highest education level: Not on file  Occupational History  . Not on file  Social Needs  . Financial resource strain: Not on file  . Food insecurity:    Worry: Not on file    Inability: Not on file  . Transportation needs:    Medical: Not on  file    Non-medical: Not on file  Tobacco Use  . Smoking status: Former Smoker    Packs/day: 0.00    Types: Cigarettes  . Smokeless tobacco: Never Used  . Tobacco comment: quit date 04/09/2018  Substance and Sexual Activity  . Alcohol use: Yes  . Drug use: No  . Sexual activity: Yes    Birth control/protection: Implant  Lifestyle  . Physical activity:    Days per week: Not on file    Minutes per session: Not on file  . Stress: Not on file  Relationships  . Social connections:    Talks on phone: Not on file    Gets together: Not on file    Attends religious service: Not on file    Active member of club or organization: Not on file    Attends meetings of clubs or organizations: Not on file    Relationship status: Not on file  . Intimate partner violence:    Fear of current or ex partner: Not on file    Emotionally abused: Not on file    Physically abused: Not on file    Forced sexual activity: Not on file  Other Topics Concern  . Not on file  Social History Narrative  . Not on file   Family History  Problem Relation Age of Onset  . Diabetes Mother   . Hypertension Mother   . Stroke Father   . Hypertension Father     OBJECTIVE:  Vitals:  04/09/18 1850 04/09/18 1851  BP: 115/74   Pulse:  85  Resp: 19   Temp: 98.7 F (37.1 C)   TempSrc: Oral   SpO2: 100%     General appearance: alert; no distress Eyes: PERRLA; EOMI; conjunctiva normal HENT: normocephalic; atraumatic; RT EAC with cerumen impaction, LT EAC clear, TM normal; nasal mucosa normal; oral mucosa normal; oropharynx clear, tonsil not enlarged or erythematous Hallpike: negative Neck: supple with FROM Lungs: clear to auscultation bilaterally Heart: regular rate and rhythm Extremities: no cyanosis or edema; symmetrical with no gross deformities Skin: warm and dry Neurologic: normal gait; CN 2-12 grossly intact; strength and sensation about the upper and lower extremities; finger to nose without  difficulty; negative pronator drift Psychological: alert and cooperative; normal mood and affect  Labs:  Results for orders placed or performed during the hospital encounter of 04/09/18 (from the past 24 hour(s))  POCT urinalysis dip (device)     Status: Abnormal   Collection Time: 04/09/18  7:21 PM  Result Value Ref Range   Glucose, UA NEGATIVE NEGATIVE mg/dL   Bilirubin Urine NEGATIVE NEGATIVE   Ketones, ur NEGATIVE NEGATIVE mg/dL   Specific Gravity, Urine 1.025 1.005 - 1.030   Hgb urine dipstick LARGE (A) NEGATIVE   pH 5.5 5.0 - 8.0   Protein, ur NEGATIVE NEGATIVE mg/dL   Urobilinogen, UA 0.2 0.0 - 1.0 mg/dL   Nitrite NEGATIVE NEGATIVE   Leukocytes,Ua NEGATIVE NEGATIVE  Glucose, capillary     Status: None   Collection Time: 04/09/18  7:22 PM  Result Value Ref Range   Glucose-Capillary 75 70 - 99 mg/dL  Pregnancy, urine POC     Status: None   Collection Time: 04/09/18  7:28 PM  Result Value Ref Range   Preg Test, Ur NEGATIVE NEGATIVE    ASSESSMENT & PLAN:  1. Vertigo   2. Impacted cerumen of right ear     Meds ordered this encounter  Medications  . carbamide peroxide (DEBROX) 6.5 % OTIC solution    Sig: Place 5 drops into the right ear 2 (two) times daily for 4 days.    Dispense:  15 mL    Refill:  0    Order Specific Question:   Supervising Provider    Answer:   Eustace Moore [[id number]]  . meclizine (ANTIVERT) 25 MG tablet    Sig: Take 1 tablet (25 mg total) by mouth 3 (three) times daily as needed for dizziness.    Dispense:  30 tablet    Refill:  0    Order Specific Question:   Supervising Provider    Answer:   Eustace Moore [[id number]]   Debrox ear drops prescribed. Use as instructed Your blood sugar was within normal limits Urine with large blood, which may be related to your menstrual cycle.  Urine did show possible slight dehydration.  Increase your fluids and drink approximately half your body weight in ounces.   Meclizine prescribed.  Take as  directed for symptomatic relief.  This medication may make you drowsy so use with cautions while driving or operating heavy machinery. Follow up with Joaquin Courts FNP as needed if symptoms persists Return or go to the ER if you have any new or worsening symptoms fever, chills, nausea, vomiting, hearing changes, ringing in ear, ear pain, chest pain, passing out, shortness of breath, weakness, slurred speech, memory or emotional changes, facial drooping/ asymmetry, incoordination, numbness or tingling, abdominal pain, changes in bowel or bladder habits, etc...  Reviewed expectations  re: course of current medical issues. Questions answered. Outlined signs and symptoms indicating need for more acute intervention. Patient verbalized understanding. After Visit Summary given.    Rennis Harding, PA-C 04/09/18 2049

## 2018-05-21 ENCOUNTER — Ambulatory Visit: Payer: BLUE CROSS/BLUE SHIELD | Admitting: Nurse Practitioner

## 2018-05-21 ENCOUNTER — Encounter

## 2018-05-26 ENCOUNTER — Encounter: Payer: Self-pay | Admitting: Family

## 2018-05-26 ENCOUNTER — Ambulatory Visit (INDEPENDENT_AMBULATORY_CARE_PROVIDER_SITE_OTHER): Payer: BLUE CROSS/BLUE SHIELD | Admitting: Family

## 2018-05-26 DIAGNOSIS — Z113 Encounter for screening for infections with a predominantly sexual mode of transmission: Secondary | ICD-10-CM | POA: Diagnosis not present

## 2018-05-26 DIAGNOSIS — Z13220 Encounter for screening for lipoid disorders: Secondary | ICD-10-CM

## 2018-05-26 DIAGNOSIS — R5383 Other fatigue: Secondary | ICD-10-CM

## 2018-05-26 NOTE — Progress Notes (Signed)
Dominique Webb is a 33 y.o. female with the following history as recorded in [employer]:  Patient Active Problem List   Diagnosis Date Noted  . ASCUS of cervix with negative high risk HPV 09/26/2016  . Tobacco abuse 09/19/2016  . Abnormal uterine bleeding (AUB) 09/19/2016  . Nexplanon in place 09/19/2016    Current Outpatient Medications  Medication Sig Dispense Refill  . medroxyPROGESTERone (DEPO-PROVERA) 150 MG/ML injection Inject 150 mg into the muscle every 3 (three) months.    . acetaminophen (TYLENOL) 500 MG tablet Take 1 tablet (500 mg total) by mouth every 6 (six) hours as needed. 30 tablet 0  . ibuprofen (ADVIL,MOTRIN) 200 MG tablet Take 400 mg by mouth every 6 (six) hours as needed for fever, headache, mild pain, moderate pain or cramping.     No current facility-administered medications for this visit.     Allergies: Aspirin  History reviewed. No pertinent past medical history.  History reviewed. No pertinent surgical history.  Family History  Problem Relation Age of Onset  . Diabetes Mother   . Hypertension Mother   . Stroke Father   . Hypertension Father     Social History   Tobacco Use  . Smoking status: Former Smoker    Packs/day: 0.00    Types: Cigarettes  . Smokeless tobacco: Never Used  . Tobacco comment: quit date 04/09/2018  Substance Use Topics  . Alcohol use: Yes    Comment: Social    Subjective:    I connected with Dominique Webb on 05/26/18 at 11:00 AM EDT by a video enabled telemedicine application and verified that I am speaking with the correct person using two identifiers. Patient and I are the only 2 people on the video call.   I discussed the limitations of evaluation and management by telemedicine and the availability of in person appointments. The patient expressed understanding and agreed to proceed.  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   HENT: Negative.   Eyes: Negative.   Respiratory: Negative.   Cardiovascular: Negative.   Gastrointestinal: Negative.   Genitourinary: Negative.   Musculoskeletal: Negative.   Skin: Negative.   Neurological: Negative.   Endo/Heme/Allergies: Negative.   Psychiatric/Behavioral: Negative.        Objective:  There were no vitals filed for this visit.  General: Well developed, well nourished, in no acute distress   Head: Normocephalic and atraumatic  Lungs: Respirations unlabored;  Neurologic: Alert and oriented; speech intact; face symmetrical;   Assessment:  1. Other fatigue   2. Lipid screening   3. Screen for STD (sexually transmitted disease)     Plan:  Patient will come to the office for labs at her convenience; congratulated for quitting smoking; continue with GYN for pap smear/ Depo;  Will need Tdap at later date; follow-up to be determined once lab results are back.    No follow-ups on file.  Orders Placed This Encounter  Procedures  . CBC w/Diff    Standing Status:   Future    Standing Expiration Date:   05/26/2019  . Comp Met (CMET)      Standing Status:   Future    Standing Expiration Date:   05/26/2019  . Lipid panel    Standing Status:   Future    Standing Expiration Date:   05/26/2019  . TSH    Standing Status:   Future    Standing Expiration Date:   05/26/2019  . Vitamin D (25 hydroxy)    Standing Status:   Future    Standing Expiration Date:   05/26/2019  . HIV Antibody (routine testing w rflx)    Standing Status:   Future    Standing Expiration Date:   05/26/2019  . RPR    Standing Status:   Future    Standing Expiration Date:   05/26/2019  . GC  Probe amplification, urine    Standing Status:   Future    Standing Expiration Date:   05/26/2019    Requested Prescriptions    No prescriptions requested or ordered in this encounter

## 2018-10-01 ENCOUNTER — Emergency Department (HOSPITAL_COMMUNITY)
Admission: EM | Admit: 2018-10-01 | Discharge: 2018-10-02 | Disposition: A | Discharge status: Home / Self Care | Payer: BC Managed Care – PPO | Attending: Emergency Medicine | Admitting: Emergency Medicine

## 2018-10-01 ENCOUNTER — Encounter (HOSPITAL_COMMUNITY): Payer: Self-pay | Admitting: Emergency Medicine

## 2018-10-01 DIAGNOSIS — R112 Nausea with vomiting, unspecified: Secondary | ICD-10-CM | POA: Diagnosis not present

## 2018-10-01 DIAGNOSIS — R1011 Right upper quadrant pain: Secondary | ICD-10-CM | POA: Diagnosis not present

## 2018-10-01 DIAGNOSIS — R1084 Generalized abdominal pain: Secondary | ICD-10-CM | POA: Diagnosis not present

## 2018-10-01 DIAGNOSIS — Z87891 Personal history of nicotine dependence: Secondary | ICD-10-CM | POA: Diagnosis not present

## 2018-10-01 DIAGNOSIS — R1010 Upper abdominal pain, unspecified: Secondary | ICD-10-CM | POA: Diagnosis not present

## 2018-10-01 DIAGNOSIS — R52 Pain, unspecified: Secondary | ICD-10-CM | POA: Diagnosis not present

## 2018-10-01 DIAGNOSIS — R1013 Epigastric pain: Secondary | ICD-10-CM | POA: Diagnosis not present

## 2018-10-01 DIAGNOSIS — R110 Nausea: Secondary | ICD-10-CM | POA: Diagnosis not present

## 2018-10-01 LAB — COMPREHENSIVE METABOLIC PANEL
ALT: 23 U/L (ref 0–44)
AST: 35 U/L (ref 15–41)
Albumin: 4.2 g/dL (ref 3.5–5.0)
Alkaline Phosphatase: 57 U/L (ref 38–126)
Anion gap: 7 (ref 5–15)
BUN: 10 mg/dL (ref 6–20)
CO2: 24 mmol/L (ref 22–32)
Calcium: 9.3 mg/dL (ref 8.9–10.3)
Chloride: 107 mmol/L (ref 98–111)
Creatinine, Ser: 0.91 mg/dL (ref 0.44–1.00)
GFR calc Af Amer: >60 mL/min (ref >60)
GFR calc non Af Amer: >60 mL/min (ref >60)
Glucose, Bld: 84 mg/dL (ref 70–99)
Potassium: 4.8 mmol/L (ref 3.5–5.1)
Sodium: 138 mmol/L (ref 135–145)
Total Bilirubin: 1 mg/dL (ref 0.3–1.2)
Total Protein: 6.7 g/dL (ref 6.5–8.1)

## 2018-10-01 LAB — URINALYSIS, ROUTINE W REFLEX MICROSCOPIC
Bacteria, UA: NONE SEEN
Bilirubin Urine: NEGATIVE
Glucose, UA: NEGATIVE mg/dL
Ketones, ur: 20 mg/dL — AB
Leukocytes,Ua: NEGATIVE
Nitrite: NEGATIVE
Protein, ur: NEGATIVE mg/dL
Specific Gravity, Urine: 1.023 (ref 1.005–1.030)
pH: 5 (ref 5.0–8.0)

## 2018-10-01 LAB — CBC
HCT: 41 % (ref 36.0–46.0)
Hemoglobin: 13.7 g/dL (ref 12.0–15.0)
MCH: 32.6 pg (ref 26.0–34.0)
MCHC: 33.4 g/dL (ref 30.0–36.0)
MCV: 97.6 fL (ref 80.0–100.0)
Platelets: 187 10*3/uL (ref 150–400)
RBC: 4.2 MIL/uL (ref 3.87–5.11)
RDW: 12.6 % (ref 11.5–15.5)
WBC: 10.5 10*3/uL (ref 4.0–10.5)
nRBC: 0 % (ref 0.0–0.2)

## 2018-10-01 LAB — I-STAT BETA HCG BLOOD, ED (MC, WL, AP ONLY): I-stat hCG, quantitative: <5 m[IU]/mL (ref <5)

## 2018-10-01 LAB — LIPASE, BLOOD: Lipase: 26 U/L (ref 11–51)

## 2018-10-01 MED ORDER — KETOROLAC TROMETHAMINE 30 MG/ML IJ SOLN
30.0000 mg | Freq: Once | INTRAMUSCULAR | Status: AC
  Administered 2018-10-01: 30 mg via INTRAVENOUS
  Filled 2018-10-01: qty 1

## 2018-10-01 MED ORDER — SODIUM CHLORIDE 0.9% FLUSH: 3.0000 mL | Freq: Once | INTRAVENOUS | Status: DC

## 2018-10-01 MED ORDER — METOCLOPRAMIDE HCL 5 MG/ML IJ SOLN
10.0000 mg | INTRAMUSCULAR | Status: AC
  Administered 2018-10-01: 22:00:00 10 mg via INTRAVENOUS
  Filled 2018-10-01: qty 2

## 2018-10-01 MED ORDER — SODIUM CHLORIDE 0.9 % IV BOLUS
1000.0000 mL | Freq: Once | INTRAVENOUS | Status: AC
  Administered 2018-10-01: 1000 mL via INTRAVENOUS

## 2018-10-01 NOTE — ED Triage Notes (Signed)
Pt arrives via [facility] after having a sudden onset of abd pain with n/d while eating.   Iv started with ems given 4mg  zofran.

## 2018-10-01 NOTE — ED Notes (Signed)
Bobby Owens(Mother#[phone]) called for an update.

## 2018-10-01 NOTE — ED Notes (Signed)
Pts mother informed this tech that the pt is currently throwing up in the bathroom as well as having a bowel movement at the same time. RN, Deneise Lever, notified.

## 2018-10-01 NOTE — ED Notes (Signed)
Attempted to collect UA, patient voided and forgot to collect sample. Will try again in a few minutes.

## 2018-10-01 NOTE — ED Provider Notes (Signed)
[facility]   CSN: [id number] Arrival date & time: 10/01/18  1503     History   Chief Complaint Chief Complaint  Patient presents with  . Abdominal Pain    HPI Dominique Webb is a 33 y.o. female.     33 year old female presents to the emergency department for evaluation of abdominal pain as well as vomiting.  She states that symptoms began suddenly after eating chicken Alfredo.  States that her boyfriend has eaten the same meal, which she made at home, without any issues.  Has had 3 episodes of nonbloody, yellow emesis.  No vomiting for the last 2 hours.  She did have one looser bowel movement when her vomiting began.  Denies any persistent diarrhea.  Her abdominal pain has been constant, mostly in her upper abdomen though it can be present throughout.  She describes her pain as sharp, aching, throbbing, cramping.  Has been spontaneously improving, though persistent.  Denies any history of abdominal surgeries.  Denies taking any medications prior to arrival.  Was given 4 mg IV Zofran by EMS en route.  The history is provided by the patient. No language interpreter was used.  Abdominal Pain   History reviewed. No pertinent past medical history.  Patient Active Problem List   Diagnosis Date Noted  . ASCUS of cervix with negative high risk HPV 09/26/2016  . Tobacco abuse 09/19/2016  . Abnormal uterine bleeding (AUB) 09/19/2016  . Nexplanon in place 09/19/2016    History reviewed. No pertinent surgical history.   OB History    Gravida  0   Para      Term      Preterm      AB      Living        SAB      TAB      Ectopic      Multiple      Live Births               Home Medications    Prior to Admission medications   Medication Sig Start Date End Date Taking? Authorizing Provider  medroxyPROGESTERone (DEPO-PROVERA) 150 MG/ML injection Inject 150 mg into the muscle every 3 (three) months.   Yes  [provider]  dicyclomine (BENTYL) 20 MG tablet Take 1 tablet (20 mg total) by mouth every 12 (twelve) hours as needed (for abdominal pain/cramping). 10/02/18   Antony MaduraHumes, Ople Girgis, PA-C  promethazine (PHENERGAN) 25 MG tablet Take 1 tablet (25 mg total) by mouth every 6 (six) hours as needed for nausea or vomiting. 10/02/18   Antony MaduraHumes, Curtisha Bendix, PA-C    Family History Family History  Problem Relation Age of Onset  . Diabetes Mother   . Hypertension Mother   . Stroke Father   . Hypertension Father     Social History Social History   Tobacco Use  . Smoking status: Former Smoker    Packs/day: 0.00    Types: Cigarettes  . Smokeless tobacco: Never Used  . Tobacco comment: quit date 04/09/2018  Substance Use Topics  . Alcohol use: Yes    Comment: Social  . Drug use: No     Allergies   Aspirin   Review of Systems Review of Systems  Gastrointestinal: Positive for abdominal pain.  Ten systems reviewed and are negative for acute change, except as noted in the HPI.    Physical Exam Updated Vital Signs BP (!) 106/55   Pulse (!) 47  Temp 98.1 F (36.7 C) (Oral)   Resp 14   SpO2 100%   Physical Exam Vitals signs and nursing note reviewed.  Constitutional:      General: She is not in acute distress.    Appearance: She is well-developed. She is not diaphoretic.     Comments: Nontoxic appearing and in NAD  HENT:     Head: Normocephalic and atraumatic.  Eyes:     General: No scleral icterus.    Conjunctiva/sclera: Conjunctivae normal.  Neck:     Musculoskeletal: Normal range of motion.  Pulmonary:     Effort: Pulmonary effort is normal. No respiratory distress.     Comments: Respirations even and unlabored Abdominal:     Palpations: There is no mass.     Tenderness: There is abdominal tenderness.     Hernia: No hernia is present.     Comments: Epigastric and RUQ TTP with negative Murphy's sign. Abdomen soft, nondistended. No peritoneal signs.  Musculoskeletal: Normal  range of motion.  Skin:    General: Skin is warm and dry.     Coloration: Skin is not pale.     Findings: No erythema or rash.  Neurological:     General: No focal deficit present.     Mental Status: She is alert and oriented to person, place, and time.     Coordination: Coordination normal.     Comments: GCS 15. Moving all extremities spontaneously.  Psychiatric:        Behavior: Behavior normal.      ED Treatments / Results  Labs (all labs ordered are listed, but only abnormal results are displayed) Labs Reviewed  URINALYSIS, ROUTINE W REFLEX MICROSCOPIC - Abnormal; Notable for the following components:      Result Value   APPearance HAZY (*)    Hgb urine dipstick SMALL (*)    Ketones, ur 20 (*)    All other components within normal limits  LIPASE, BLOOD  COMPREHENSIVE METABOLIC PANEL  CBC  I-STAT BETA HCG BLOOD, ED ([facility], [facility], [facility] ONLY)    EKG None  Radiology US Abdomen Limited  Result Date: 10/02/2018 CLINICAL DATA:  33 year old female with right upper quadrant abdominal pain. EXAM: ULTRASOUND ABDOMEN LIMITED RIGHT UPPER QUADRANT COMPARISON:  None. FINDINGS: Gallbladder: No gallstones or wall thickening visualized. No sonographic Murphy sign noted by sonographer. Common bile duct: Diameter: 3 mm Liver: The liver is unremarkable. Portal vein is patent on color Doppler imaging with normal direction of blood flow towards the liver. Other: None. IMPRESSION: Unremarkable right upper quadrant ultrasound. Electronically Signed   By: Elgie Collard M.D.   On: 10/02/2018 00:30    Procedures Procedures (including critical care time)  Medications Ordered in ED Medications  sodium chloride flush (NS) 0.9 % injection 3 mL (3 mLs Intravenous Not Given 10/01/18 2239)  metoCLOPramide (REGLAN) injection 10 mg (10 mg Intravenous Given 10/01/18 2226)  ketorolac (TORADOL) 30 MG/ML injection 30 mg (30 mg Intravenous Given 10/01/18 2226)  sodium chloride 0.9 % bolus 1,000 mL (0 mLs  Intravenous Stopped 10/02/18 0037)  pantoprazole (PROTONIX) injection 40 mg (40 mg Intravenous Given 10/02/18 0106)    11:45 PM Patient reassessed.  She states that she is feeling better.  Rates her pain currently at 4/10, down from 9/10 on arrival.  Not currently complaining of nausea.  Pending ultrasound.  12:32 AM Ultrasound completed.  Awaiting results.  Will fluid challenge with anticipation of discharge.  1:30 AM Patient tolerating PO without further emesis.   Initial  Impression / Assessment and Plan / ED Course  I have reviewed the triage vital signs and the nursing notes.  Pertinent labs & imaging results that were available during my care of the patient were reviewed by me and considered in my medical decision making (see chart for details).        Patient presenting with vomiting and abdominal pain which began after eating chicken Alfredo.  No history of abdominal surgeries with primary tenderness in the upper abdomen on exam.  No peritoneal signs.  Vitals stable without fever.  Laboratory evaluation in the emergency department has been reassuring without leukocytosis or electrolyte derangements.  Liver and kidney function preserved.  Ultrasound was obtained to assess for gallstones.  This is negative.  Given symptomatic improvement in the ED with ability to tolerate PO, do not feel further emergent work-up is indicated.  Will continue with outpatient management with Phenergan.  Patient advised to follow up with a primary care doctor to ensure symptom resolution. Suspect food-borne versus viral etiology.  Return precautions discussed and provided.  Patient discharged in stable condition with no unaddressed concerns.   Final Clinical Impressions(s) / ED Diagnoses   Final diagnoses:  Pain of upper abdomen  Non-intractable vomiting with nausea, unspecified vomiting type    ED Discharge Orders         Ordered    dicyclomine (BENTYL) 20 MG tablet  Every 12 hours PRN      10/02/18 0139    promethazine (PHENERGAN) 25 MG tablet  Every 6 hours PRN     10/02/18 0139           Antonietta Breach, PA-C 10/02/18 5397    Lucrezia Starch, MD 10/03/18 0157

## 2018-10-02 ENCOUNTER — Emergency Department (HOSPITAL_COMMUNITY): Payer: BC Managed Care – PPO

## 2018-10-02 DIAGNOSIS — R1011 Right upper quadrant pain: Secondary | ICD-10-CM | POA: Diagnosis not present

## 2018-10-02 MED ORDER — PROMETHAZINE HCL 25 MG PO TABS: 25.0000 mg | ORAL_TABLET | Freq: Four times a day (QID) | ORAL | 0 refills | Status: DC | PRN

## 2018-10-02 MED ORDER — DICYCLOMINE HCL 20 MG PO TABS: 20.0000 mg | ORAL_TABLET | Freq: Two times a day (BID) | ORAL | 0 refills | Status: DC | PRN

## 2018-10-02 MED ORDER — PANTOPRAZOLE SODIUM 40 MG IV SOLR
40.0000 mg | Freq: Once | INTRAVENOUS | Status: AC
  Administered 2018-10-02: 01:00:00 40 mg via INTRAVENOUS
  Filled 2018-10-02: qty 40

## 2018-10-02 NOTE — ED Notes (Signed)
Pt tolerating PO fluids

## 2018-10-02 NOTE — Discharge Instructions (Signed)
Avoid fried foods, fatty foods, greasy foods, and milk products until symptoms resolve. Drink plenty of clear liquids. We recommend the use of Phenergan as prescribed for nausea/vomiting. Take tylenol or ibuprofen for abdominal pain; you may also use Bentyl for persistent pain if needed. Follow-up with a primary care doctor to ensure resolution of symptoms.

## 2018-10-02 NOTE — ED Notes (Signed)
Patient verbalizes understanding of discharge instructions. Opportunity for questioning and answers were provided. Armband removed by staff, pt discharged from ED ambulatory.   

## 2018-11-05 ENCOUNTER — Encounter: Payer: Self-pay | Admitting: Internal Medicine

## 2018-11-05 ENCOUNTER — Ambulatory Visit (INDEPENDENT_AMBULATORY_CARE_PROVIDER_SITE_OTHER): Payer: BC Managed Care – PPO | Admitting: Internal Medicine

## 2018-11-05 DIAGNOSIS — Z20828 Contact with and (suspected) exposure to other viral communicable diseases: Secondary | ICD-10-CM

## 2018-11-05 DIAGNOSIS — Z20822 Contact with and (suspected) exposure to covid-19: Secondary | ICD-10-CM

## 2018-11-05 NOTE — Progress Notes (Signed)
Virtual Visit via Video Note  I connected with Dominique Webb on 11/05/18 at  3:40 PM EDT by a video enabled telemedicine application and verified that I am speaking with the correct person using two identifiers.  The patient and the provider were at separate locations throughout the entire encounter.   I discussed the limitations of evaluation and management by telemedicine and the availability of in person appointments. The patient expressed understanding and agreed to proceed. The patient and the provider were the only parties present for the visit unless noted in HPI below.  History of Present Illness: The patient is a 33 y.o. female with visit for flu like symptoms. Started Saturday and she has been staying home since that time. She denies fevers but some chills. Some nose congestion. Denies loss of taste/smell. Denies headaches or SOB. Has mild symptoms which are improving. Rare cough. Denies worsening. Needs work note and would like covid-19 test to rule this out. Has tried nothing.  Observations/Objective: Appearance: normal, breathing appears comfortable, no coughing during visit, casual grooming, abdomen does not appear distended, throat normal, memory normal, mental status is A and O times 3  Assessment and Plan: See problem oriented charting  Follow Up Instructions: stay out of work until well, minimum 10 days if covid-19 positive, checking covid-19 test  Visit time 25 minutes: greater than 50% of that time was spent in face to face counseling and coordination of care with the patient: counseled about need for quarantine, how treatment changes based on results of covid-19 testing as well as if covid-19 positive when it is safe to return to work  I discussed the assessment and treatment plan with the patient. The patient was provided an opportunity to ask questions and all were answered. The patient agreed with the plan and demonstrated an understanding of the instructions.   The  patient was advised to call back or seek an in-person evaluation if the symptoms worsen or if the condition fails to improve as anticipated.  Hoyt Koch, MD

## 2018-11-06 DIAGNOSIS — Z20822 Contact with and (suspected) exposure to covid-19: Secondary | ICD-10-CM | POA: Insufficient documentation

## 2018-11-06 NOTE — Assessment & Plan Note (Signed)
Getting covid-19 test. Supportive measures encouraged. Discussed how smoking cessation will help her health long term.

## 2019-01-12 ENCOUNTER — Telehealth: Payer: Self-pay

## 2019-01-12 NOTE — Telephone Encounter (Signed)
Would prefer that she continue to get her GYN; they are managing her pap smears and have the history of her Depo injection.   The last one I can see was from 02/2018.

## 2019-01-12 NOTE — Telephone Encounter (Signed)
Copied from CRM [id number]. Topic: Appointment Scheduling - Scheduling Inquiry for Clinic >> Jan 12, 2019  1:35 PM Baldo Daub L wrote: Reason for CRM:   Pt calling to schedule depo injection.  Pt wants to have it done at PCP office, no at OB/GYN.  Per [facility] send CRM.

## 2019-01-13 NOTE — Telephone Encounter (Signed)
Spoke with patient today and info given. 

## 2019-02-19 ENCOUNTER — Encounter: Payer: Self-pay | Admitting: Family

## 2019-02-19 ENCOUNTER — Ambulatory Visit: Payer: BC Managed Care – PPO | Attending: Family

## 2019-02-19 ENCOUNTER — Other Ambulatory Visit: Payer: Self-pay | Admitting: Family

## 2019-02-19 ENCOUNTER — Ambulatory Visit (INDEPENDENT_AMBULATORY_CARE_PROVIDER_SITE_OTHER): Payer: BC Managed Care – PPO | Admitting: Family

## 2019-02-19 DIAGNOSIS — J069 Acute upper respiratory infection, unspecified: Secondary | ICD-10-CM

## 2019-02-19 DIAGNOSIS — Z20822 Contact with and (suspected) exposure to covid-19: Secondary | ICD-10-CM | POA: Diagnosis not present

## 2019-02-19 MED ORDER — CETIRIZINE HCL 10 MG PO TABS: 10.0000 mg | ORAL_TABLET | Freq: Every day | ORAL | 1 refills | Status: DC

## 2019-02-19 MED ORDER — FLUTICASONE PROPIONATE 50 MCG/ACT NA SUSP: 2.0000 | Freq: Every day | NASAL | 6 refills | Status: DC

## 2019-02-19 NOTE — Progress Notes (Signed)
  Dominique Webb is a 34 y.o. female with the following history as recorded in EpicCare:  Patient Active Problem List   Diagnosis Date Noted  . Suspected COVID-19 virus infection 11/06/2018  . ASCUS of cervix with negative high risk HPV 09/26/2016  . Tobacco abuse 09/19/2016  . Abnormal uterine bleeding (AUB) 09/19/2016  . Nexplanon in place 09/19/2016    Current Outpatient Medications  Medication Sig Dispense Refill  . cetirizine (ZYRTEC) 10 MG tablet Take 1 tablet (10 mg total) by mouth daily. 30 tablet 1  . fluticasone (FLONASE) 50 MCG/ACT nasal spray Place 2 sprays into both nostrils daily. 16 g 6   No current facility-administered medications for this visit.    Allergies: Aspirin  No past medical history on file.  No past surgical history on file.  Family History  Problem Relation Age of Onset  . Diabetes Mother   . Hypertension Mother   . Stroke Father   . Hypertension Father     Social History   Tobacco Use  . Smoking status: Former Smoker    Packs/day: 0.00    Types: Cigarettes  . Smokeless tobacco: Never Used  . Tobacco comment: quit date 04/09/2018  Substance Use Topics  . Alcohol use: Yes    Comment: Social    Subjective:    I connected with Dominique Webb on 02/19/19 at 10:20 AM EST by a video enabled telemedicine application and verified that I am speaking with the correct person using two identifiers.   I discussed the limitations of evaluation and management by telemedicine and the availability of in person appointments. The patient expressed understanding and agreed to proceed.   Provider in office/ patient is at home; provider and patient are only 2 people on video call.   Patient has had cold symptoms since last Sunday; denies any fever or shortness of breath; scratchy sore throat, itchy eyes; has not taken any medication for symptom relief;  Does need a COVID test to be allowed to return to work- has been out of work for the past 5 days;     Objective:  There were no vitals filed for this visit.  General: Well developed, well nourished, in no acute distress  Head: Normocephalic and atraumatic  Lungs: Respirations unlabored;  Neurologic: Alert and oriented; speech intact; face symmetrical;   Assessment:  1. Viral URI with cough     Plan:  Information for COVID testing given to patient; work note for today; Rx for Zyrtec and Flonase; increase fluids, rest and follow-up worse, no better.  No follow-ups on file.  No orders of the defined types were placed in this encounter.   Requested Prescriptions   Signed Prescriptions Disp Refills  . cetirizine (ZYRTEC) 10 MG tablet 30 tablet 1    Sig: Take 1 tablet (10 mg total) by mouth daily.  . fluticasone (FLONASE) 50 MCG/ACT nasal spray 16 g 6    Sig: Place 2 sprays into both nostrils daily.

## 2019-02-20 LAB — NOVEL CORONAVIRUS, NAA: SARS-CoV-2, NAA: DETECTED — AB

## 2019-02-22 ENCOUNTER — Telehealth: Payer: Self-pay

## 2019-02-22 NOTE — Telephone Encounter (Signed)
Pt notified of positive COVID-19 test results. Pt verbalized understanding. Pt reports that they are feeling no sx.Pt advised to remain in self quarantine until at least 10 days since symptom onset And 3 consecutive days fever free without antipyretics And improvement in respiratory symptoms. Patient advised to utilize over the counter medications to treat symptoms. Pt advised to seek treatment in the ED if respiratory issues/distress develops.Pt advised they should only leave home to seek and medical care and must wear a mask in public. Pt instructed to limit contact with family members or caregivers in the home. Pt advised to practice social distancing and to continue to use good preventative care measures such has frequent hand washing, staying out of crowds and cleaning hard surfaces frequently touched in the home.Pt informed that the health department will likely follow up and may have additional recommendations. Previous note indicated pt was reported to HD.

## 2019-03-01 ENCOUNTER — Ambulatory Visit: Payer: BC Managed Care – PPO | Attending: Internal Medicine

## 2019-03-01 DIAGNOSIS — Z20822 Contact with and (suspected) exposure to covid-19: Secondary | ICD-10-CM | POA: Diagnosis not present

## 2019-03-02 ENCOUNTER — Telehealth: Payer: Self-pay

## 2019-03-02 LAB — NOVEL CORONAVIRUS, NAA: SARS-CoV-2, NAA: DETECTED — AB

## 2019-03-02 NOTE — Telephone Encounter (Signed)
New message    The patient calling for test results.   

## 2019-03-02 NOTE — Telephone Encounter (Signed)
Spoke with patient today. Note emailed to her stating that she took test yesterday and was still positive.

## 2019-03-05 ENCOUNTER — Telehealth: Payer: Self-pay

## 2019-03-05 NOTE — Telephone Encounter (Signed)
She will need to follow her HR guidelines.

## 2019-03-05 NOTE — Telephone Encounter (Signed)
New message   The Patient calling need Dominique Webb to give her a call to discuss her situation at work due to positive COVID on 2/14 retested on 2/22 was told she retested too soon and she is out of work now.    Her job is asking for a return to work note patient is uncomfortable returning to work.    Her current job is Counselling psychologist.  HR advises her to stay out of work 10 more days due to retesting.   Please advise

## 2019-03-08 NOTE — Telephone Encounter (Signed)
Patient call back and was informed below, but states she still needs note from PCP to return to work, Requesting a call back from [facility]

## 2019-03-09 NOTE — Telephone Encounter (Signed)
Spoke with patient today and letter has been completed for her to return to work on March 8th.  Emailed copy to her today per her request.

## 2019-03-09 NOTE — Telephone Encounter (Signed)
Message left for patient today to return call to clinic regarding note for work. Left a message that I could do a note stating "Patient may return to work based on HR COVID guildelines and restrictions".  Awaiting to hear back from her to do note.

## 2019-03-17 ENCOUNTER — Ambulatory Visit: Payer: BC Managed Care – PPO | Attending: Internal Medicine

## 2019-03-17 ENCOUNTER — Ambulatory Visit: Payer: BC Managed Care – PPO

## 2019-03-17 DIAGNOSIS — Z20822 Contact with and (suspected) exposure to covid-19: Secondary | ICD-10-CM

## 2019-03-18 LAB — NOVEL CORONAVIRUS, NAA: SARS-CoV-2, NAA: NOT DETECTED

## 2019-04-13 ENCOUNTER — Other Ambulatory Visit: Payer: Self-pay

## 2019-04-13 ENCOUNTER — Ambulatory Visit: Payer: BC Managed Care – PPO | Admitting: Family

## 2019-04-13 VITALS — BP 110/76 | HR 58 | Temp 98.5°F | Ht 62.0 in | Wt 131.2 lb

## 2019-04-13 DIAGNOSIS — H00015 Hordeolum externum left lower eyelid: Secondary | ICD-10-CM

## 2019-04-13 MED ORDER — TOBRAMYCIN 0.3 % OP SOLN: 1.0000 [drp] | Freq: Four times a day (QID) | OPHTHALMIC | 0 refills | Status: DC

## 2019-04-13 NOTE — Progress Notes (Signed)
  Dominique Webb is a 34 y.o. female with the following history as recorded in EpicCare:  Patient Active Problem List   Diagnosis Date Noted  . Suspected COVID-19 virus infection 11/06/2018  . ASCUS of cervix with negative high risk HPV 09/26/2016  . Tobacco abuse 09/19/2016  . Abnormal uterine bleeding (AUB) 09/19/2016  . Nexplanon in place 09/19/2016    Current Outpatient Medications  Medication Sig Dispense Refill  . cetirizine (ZYRTEC) 10 MG tablet Take 1 tablet (10 mg total) by mouth daily. (Patient not taking: Reported on 04/13/2019) 30 tablet 1  . fluticasone (FLONASE) 50 MCG/ACT nasal spray Place 2 sprays into both nostrils daily. (Patient not taking: Reported on 04/13/2019) 16 g 6  . tobramycin (TOBREX) 0.3 % ophthalmic solution Place 1 drop into the left eye every 6 (six) hours. 5 mL 0   No current facility-administered medications for this visit.    Allergies: Aspirin  No past medical history on file.  No past surgical history on file.  Family History  Problem Relation Age of Onset  . Diabetes Mother   . Hypertension Mother   . Stroke Father   . Hypertension Father     Social History   Tobacco Use  . Smoking status: Former Smoker    Packs/day: 0.00    Types: Cigarettes  . Smokeless tobacco: Never Used  . Tobacco comment: quit date 04/09/2018  Substance Use Topics  . Alcohol use: Yes    Comment: Social    Subjective:  Concerned about a "bump" at corner of her left eye; symptoms x 1-2 weeks; does feel that area has been decreasing in size; no vision changes; no pain; has been flushing her eye regularly;  Objective:  Vitals:   04/13/19 1240  BP: 110/76  Pulse: (!) 58  Temp: 98.5 F (36.9 C)  TempSrc: Oral  SpO2: 97%  Weight: 131 lb 3.2 oz (59.5 kg)  Height: 5\' 2"  (1.575 m)    General: Well developed, well nourished, in no acute distress  Skin : Warm and dry.  Head: Normocephalic and atraumatic  Eyes: Sclera and conjunctiva clear; pupils round and reactive  to light; extraocular movements intact  Lungs: Respirations unlabored;  Neurologic: Alert and oriented; speech intact; face symmetrical;   Assessment:  1. Hordeolum externum of left lower eyelid     Plan:  1. Encouraged to use warm compresses; Rx for Tobrex eye drops; will need to see her eye doctor if symptoms persist;  Is overdue to see her GYN- encouraged to schedule follow-up.  This visit occurred during the SARS-CoV-2 public health emergency.  Safety protocols were in place, including screening questions prior to the visit, additional usage of staff PPE, and extensive cleaning of exam room while observing appropriate contact time as indicated for disinfecting solutions.     No follow-ups on file.  No orders of the defined types were placed in this encounter.   Requested Prescriptions   Signed Prescriptions Disp Refills  . tobramycin (TOBREX) 0.3 % ophthalmic solution 5 mL 0    Sig: Place 1 drop into the left eye every 6 (six) hours.

## 2019-04-13 NOTE — Patient Instructions (Signed)

## 2019-06-29 ENCOUNTER — Ambulatory Visit: Payer: BC Managed Care – PPO | Admitting: Family

## 2019-06-30 ENCOUNTER — Other Ambulatory Visit (INDEPENDENT_AMBULATORY_CARE_PROVIDER_SITE_OTHER): Payer: BC Managed Care – PPO

## 2019-06-30 ENCOUNTER — Encounter: Payer: Self-pay | Admitting: Family

## 2019-06-30 ENCOUNTER — Ambulatory Visit: Payer: BC Managed Care – PPO | Admitting: Family

## 2019-06-30 ENCOUNTER — Telehealth: Payer: Self-pay | Admitting: Family

## 2019-06-30 ENCOUNTER — Other Ambulatory Visit: Payer: Self-pay

## 2019-06-30 ENCOUNTER — Other Ambulatory Visit: Payer: Self-pay | Admitting: Family

## 2019-06-30 VITALS — BP 90/62 | HR 67 | Temp 98.7°F | Wt 119.4 lb

## 2019-06-30 DIAGNOSIS — R112 Nausea with vomiting, unspecified: Secondary | ICD-10-CM

## 2019-06-30 DIAGNOSIS — R42 Dizziness and giddiness: Secondary | ICD-10-CM

## 2019-06-30 DIAGNOSIS — R634 Abnormal weight loss: Secondary | ICD-10-CM

## 2019-06-30 DIAGNOSIS — H6121 Impacted cerumen, right ear: Secondary | ICD-10-CM | POA: Diagnosis not present

## 2019-06-30 LAB — TSH: TSH: 1.32 u[IU]/mL (ref 0.35–4.50)

## 2019-06-30 LAB — COMPREHENSIVE METABOLIC PANEL
ALT: 11 U/L (ref 0–35)
AST: 16 U/L (ref 0–37)
Albumin: 4.3 g/dL (ref 3.5–5.2)
Alkaline Phosphatase: 46 U/L (ref 39–117)
BUN: 7 mg/dL (ref 6–23)
CO2: 26 mEq/L (ref 19–32)
Calcium: 9.1 mg/dL (ref 8.4–10.5)
Chloride: 105 mEq/L (ref 96–112)
Creatinine, Ser: 0.87 mg/dL (ref 0.40–1.20)
GFR: 88.99 mL/min (ref >60.00)
Glucose, Bld: 102 mg/dL — ABNORMAL HIGH (ref 70–99)
Potassium: 3.7 mEq/L (ref 3.5–5.1)
Sodium: 137 mEq/L (ref 135–145)
Total Bilirubin: 0.4 mg/dL (ref 0.2–1.2)
Total Protein: 6.7 g/dL (ref 6.0–8.3)

## 2019-06-30 LAB — CBC WITH DIFFERENTIAL/PLATELET
Basophils Absolute: 0 10*3/uL (ref 0.0–0.1)
Basophils Relative: 0.4 % (ref 0.0–3.0)
Eosinophils Absolute: 0.1 10*3/uL (ref 0.0–0.7)
Eosinophils Relative: 1.7 % (ref 0.0–5.0)
HCT: 39 % (ref 36.0–46.0)
Hemoglobin: 13.3 g/dL (ref 12.0–15.0)
Lymphocytes Relative: 32.3 % (ref 12.0–46.0)
Lymphs Abs: 2.3 10*3/uL (ref 0.7–4.0)
MCHC: 34.1 g/dL (ref 30.0–36.0)
MCV: 95.4 fl (ref 78.0–100.0)
Monocytes Absolute: 0.6 10*3/uL (ref 0.1–1.0)
Monocytes Relative: 8.6 % (ref 3.0–12.0)
Neutro Abs: 4 10*3/uL (ref 1.4–7.7)
Neutrophils Relative %: 57 % (ref 43.0–77.0)
Platelets: 153 10*3/uL (ref 150.0–400.0)
RBC: 4.09 Mil/uL (ref 3.87–5.11)
RDW: 13.5 % (ref 11.5–15.5)
WBC: 7 10*3/uL (ref 4.0–10.5)

## 2019-06-30 NOTE — Patient Instructions (Signed)
Please purchase OTC Debrox and use those drops for 3-4 days prior to next appointment; that will make it easier for [place] to flush out your ear.

## 2019-06-30 NOTE — Progress Notes (Signed)
Dominique Webb is a 34 y.o. female with the following history as recorded in EpicCare:  Patient Active Problem List   Diagnosis Date Noted  . Suspected COVID-19 virus infection 11/06/2018  . ASCUS of cervix with negative high risk HPV 09/26/2016  . Tobacco abuse 09/19/2016  . Abnormal uterine bleeding (AUB) 09/19/2016  . Nexplanon in place 09/19/2016    No current outpatient medications on file.   No current facility-administered medications for this visit.    Allergies: Aspirin  No past medical history on file.  No past surgical history on file.  Family History  Problem Relation Age of Onset  . Diabetes Mother   . Hypertension Mother   . Stroke Father   . Hypertension Father     Social History   Tobacco Use  . Smoking status: Former Smoker    Packs/day: 0.00    Types: Cigarettes  . Smokeless tobacco: Never Used  . Tobacco comment: quit date 04/09/2018  Substance Use Topics  . Alcohol use: Yes    Comment: Social    Subjective:  Patient presents with concerns for feeling dizzy for the past 2 weeks; has vomited occasionally as well; Has lost 12 pounds since her OV in April 2021;  Notes she has actually felt better this week- last time she vomited or felt dizzy was last week;  LMP- 06/24/19- only lasted 3 days (normal is 7 days); off Depo- took pregnancy test last Wednesday and it was negative. Has been with same partner for 4+ years;   Objective:  Vitals:   06/30/19 1107  BP: 90/62  Pulse: 67  Temp: 98.7 F (37.1 C)  TempSrc: Oral  SpO2: 99%  Weight: 119 lb 6.4 oz (54.2 kg)    General: Well developed, well nourished, in no acute distress  Skin : Warm and dry.  Head: Normocephalic and atraumatic  Eyes: Sclera and conjunctiva clear; pupils round and reactive to light; extraocular movements intact  Ears: External normal; cerumen impaction noted of right ear; Oropharynx: Pink, supple. No suspicious lesions  Neck: Supple without thyromegaly, adenopathy  Lungs:  Respirations unlabored; clear to auscultation bilaterally without wheeze, rales, rhonchi  CVS exam: normal rate and regular rhythm.  Abdomen: Soft; nontender; nondistended; normoactive bowel sounds; no masses or hepatosplenomegaly  Musculoskeletal: No deformities; no active joint inflammation  Extremities: No edema, cyanosis, clubbing  Vessels: Symmetric bilaterally  Neurologic: Alert and oriented; speech intact; face symmetrical; moves all extremities well; CNII-XII intact without focal deficit   Assessment:  1. Dizziness   2. Nausea and vomiting, intractability of vomiting not specified, unspecified vomiting type   3. Unexplained weight loss     Plan:  1. ? Related to cerumen impaction; attempted lavage in office but patient was crying and complaining of pain/ will refer to ENT let them attempt removal; 2. Per patient, symptoms have resolved in the past week; check hcg; per patient, last period was shorter than normal last week/ unprotected sex; 3. Update labs today; may need to consider CXR is pregnancy test is negative; follow-up to be determined.  This visit occurred during the SARS-CoV-2 public health emergency.  Safety protocols were in place, including screening questions prior to the visit, additional usage of staff PPE, and extensive cleaning of exam room while observing appropriate contact time as indicated for disinfecting solutions.     No follow-ups on file.  Orders Placed This Encounter  Procedures  . CBC w/Diff    Standing Status:   Future    Standing  Expiration Date:   06/29/2020  . Comp Met (CMET)    Standing Status:   Future    Standing Expiration Date:   06/29/2020  . TSH    Standing Status:   Future    Standing Expiration Date:   06/29/2020  . hCG, serum, qualitative    Standing Status:   Future    Standing Expiration Date:   06/29/2020  . HIV antibody (with reflex)    Standing Status:   Future    Standing Expiration Date:   06/29/2020    Requested  Prescriptions    No prescriptions requested or ordered in this encounter

## 2019-06-30 NOTE — Telephone Encounter (Signed)
    Patient requesting work note for today Patient states after visit she started to feel pain in her face after ear was flushed.  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

## 2019-07-01 LAB — HIV ANTIBODY (ROUTINE TESTING W REFLEX): HIV 1&2 Ab, 4th Generation: NONREACTIVE

## 2019-07-01 LAB — HCG, SERUM, QUALITATIVE: Preg, Serum: NEGATIVE

## 2019-07-20 ENCOUNTER — Encounter (INDEPENDENT_AMBULATORY_CARE_PROVIDER_SITE_OTHER): Payer: Self-pay | Admitting: Otolaryngology

## 2019-07-20 ENCOUNTER — Other Ambulatory Visit: Payer: Self-pay

## 2019-07-20 ENCOUNTER — Ambulatory Visit (INDEPENDENT_AMBULATORY_CARE_PROVIDER_SITE_OTHER): Payer: BC Managed Care – PPO | Admitting: Otolaryngology

## 2019-07-20 VITALS — Temp 97.7°F

## 2019-07-20 DIAGNOSIS — H6121 Impacted cerumen, right ear: Secondary | ICD-10-CM | POA: Diagnosis not present

## 2019-07-20 NOTE — Progress Notes (Signed)
HPI: Dominique Webb is a 34 y.o. female who presents is referred by her PCP for evaluation of right ear cerumen impaction.  She is unable to remove it in the office.  It caused too much pain to try to remove.  She has decreased hearing in the right ear only..  She uses Q-tips.  No past medical history on file. No past surgical history on file. Social History   Socioeconomic History   Marital status: Single    Spouse name: Not on file   Number of children: Not on file   Years of education: Not on file   Highest education level: Not on file  Occupational History   Not on file  Tobacco Use   Smoking status: Current Some Day Smoker    Packs/day: 0.50    Years: 16.00    Pack years: 8.00    Types: Cigarettes    Start date: 2004   Smokeless tobacco: Never Used   Tobacco comment: pt somokes some days  Vaping Use   Vaping Use: Never used  Substance and Sexual Activity   Alcohol use: Yes    Comment: Social   Drug use: No   Sexual activity: Yes    Birth control/protection: Injection  Other Topics Concern   Not on file  Social History Narrative   Not on file   Social Determinants of Health   Financial Resource Strain:    Difficulty of Paying Living Expenses:   Food Insecurity:    Worried About Programme researcher, broadcasting/film/video in the Last Year:    Barista in the Last Year:   Transportation Needs:    Freight forwarder (Medical):    Lack of Transportation (Non-Medical):   Physical Activity:    Days of Exercise per Week:    Minutes of Exercise per Session:   Stress:    Feeling of Stress :   Social Connections:    Frequency of Communication with Friends and Family:    Frequency of Social Gatherings with Friends and Family:    Attends Religious Services:    Active Member of Clubs or Organizations:    Attends Engineer, structural:    Marital Status:    Family History  Problem Relation Age of Onset   Diabetes Mother    Hypertension  Mother    Stroke Father    Hypertension Father    Allergies  Allergen Reactions   Aspirin Other (See Comments)    "stomach bleeds" Can take Advil   Prior to Admission medications   Not on File     Positive ROS: Otherwise negative  All other systems have been reviewed and were otherwise negative with the exception of those mentioned in the HPI and as above.  Physical Exam: Constitutional: Alert, well-appearing, no acute distress Ears: External ears without lesions or tenderness.  Left ear canal with minimal wax that was removed.  Right ear canal is completely occluded with cerumen impacted against the TM.  This was removed with forceps curettes and suction.  The TM was clear. Nasal: External nose without lesion. Clear nasal passages Oral: Lips and gums without lesions. Tongue and palate mucosa without lesions. Posterior oropharynx clear. Neck: No palpable adenopathy or masses Respiratory: Breathing comfortably  Skin: No facial/neck lesions or rash noted.  Cerumen impaction removal  Date/Time: 07/20/2019 1:57 PM Performed by: Drema Halon, MD Authorized by: Drema Halon, MD   Consent:    Consent obtained:  Verbal   Consent  given by:  Patient   Risks discussed:  Pain and bleeding Procedure details:    Location:  R ear   Procedure type: curette, suction and forceps   Post-procedure details:    Inspection:  TM intact and canal normal   Hearing quality:  Improved   Patient tolerance of procedure:  Tolerated well, no immediate complications Comments:     TMs were clear bilaterally.  Hearing was much better in the right ear after cleaning the ear canal.  She had mild erythema from the impacted cerumen.    Assessment: Right ea impacted cerumen  Plan: This was cleaned in the office. Cautioned her about not using Q-tips in the future.   Narda Bonds, MD   CC:

## 2020-01-08 NOTE — L&D Delivery Note (Signed)
[facility] Delivery Note  Dominique Webb is a 35 y.o. G1P1001 s/p vag del at [redacted]w[redacted]d. She was admitted for IOL due to FGR.   ROM: 2h 61m with clear fluid GBS Status: neg Maximum Maternal Temperature: 98.6  Labor Progress: Ms Gallardo was admitted for IOL due to FGR <1st%. She had a cervical foley and cytotec x 2 doses and then progressed to vag del.  Delivery Date/Time: August 25th, 2022 at 0622 Delivery: Called to room and patient was complete and pushing. Head delivered ROA. No nuchal cord present. Shoulder and body delivered in usual fashion. Infant with spontaneous cry, placed on mother's abdomen, dried and stimulated. Cord clamped x 2 after 1-minute delay, and cut by sister of pt. Cord blood drawn. Placenta delivered spontaneously with gentle cord traction. Fundus firm with massage and Pitocin. Labia, perineum, vagina, and cervix inspected and found to be intact.   Placenta: spont, intact; to pathology due to FGR Complications: none Lacerations: none EBL: 100cc Analgesia: none  Postpartum Planning [x]  message to sent to schedule follow-up   Infant: girl  APGARs 8/9  2166g (4lb 12.4oz)  12-30-1987, CNM  08/31/2020 8:21 AM

## 2020-01-25 ENCOUNTER — Telehealth: Payer: Self-pay | Admitting: Family

## 2020-01-25 NOTE — Telephone Encounter (Signed)
Regarding her planned OV to discuss re-starting Depo-Provera on Friday- that has been managed by her GYN up until 2020. I do not have records of an up to date pap smear. When was that last done? We will need to get a copy or plan to update before we could start Depo.

## 2020-01-25 NOTE — Telephone Encounter (Signed)
Left message to notify patient regarding need for copy of last pap smear; can discuss further at her upcoming visit on Friday.

## 2020-01-28 ENCOUNTER — Ambulatory Visit: Payer: BC Managed Care – PPO | Admitting: Family

## 2020-03-01 ENCOUNTER — Other Ambulatory Visit (HOSPITAL_COMMUNITY)
Admission: RE | Admit: 2020-03-01 | Discharge: 2020-03-01 | Disposition: A | Discharge status: Home / Self Care | Payer: BC Managed Care – PPO | Source: Ambulatory Visit | Attending: Advanced Practice Midwife | Admitting: Advanced Practice Midwife

## 2020-03-01 ENCOUNTER — Encounter: Payer: Self-pay | Admitting: Advanced Practice Midwife

## 2020-03-01 ENCOUNTER — Other Ambulatory Visit: Payer: Self-pay

## 2020-03-01 ENCOUNTER — Ambulatory Visit (INDEPENDENT_AMBULATORY_CARE_PROVIDER_SITE_OTHER): Payer: BC Managed Care – PPO | Admitting: Advanced Practice Midwife

## 2020-03-01 VITALS — BP 101/54 | HR 80 | Ht 63.0 in | Wt 116.0 lb

## 2020-03-01 DIAGNOSIS — Z1151 Encounter for screening for human papillomavirus (HPV): Secondary | ICD-10-CM | POA: Diagnosis not present

## 2020-03-01 DIAGNOSIS — Z01419 Encounter for gynecological examination (general) (routine) without abnormal findings: Secondary | ICD-10-CM | POA: Diagnosis not present

## 2020-03-01 DIAGNOSIS — R87810 Cervical high risk human papillomavirus (HPV) DNA test positive: Secondary | ICD-10-CM | POA: Diagnosis not present

## 2020-03-01 DIAGNOSIS — Z3A00 Weeks of gestation of pregnancy not specified: Secondary | ICD-10-CM | POA: Insufficient documentation

## 2020-03-01 DIAGNOSIS — Z331 Pregnant state, incidental: Secondary | ICD-10-CM | POA: Diagnosis not present

## 2020-03-01 DIAGNOSIS — O98511 Other viral diseases complicating pregnancy, first trimester: Secondary | ICD-10-CM | POA: Insufficient documentation

## 2020-03-01 DIAGNOSIS — Z3401 Encounter for supervision of normal first pregnancy, first trimester: Secondary | ICD-10-CM | POA: Insufficient documentation

## 2020-03-01 DIAGNOSIS — N912 Amenorrhea, unspecified: Secondary | ICD-10-CM

## 2020-03-01 LAB — POCT PREGNANCY, URINE: Preg Test, Ur: POSITIVE — AB

## 2020-03-01 NOTE — Progress Notes (Signed)
GYNECOLOGY ANNUAL PREVENTATIVE CARE ENCOUNTER NOTE  Subjective:   Dominique Webb is a 35 y.o. G0P0 female here for a routine annual gynecologic exam.  Current complaints: interested in starting depo provera for birth control. She has not had a period since around December. She reports that she usually has regular periods.   Denies abnormal vaginal bleeding, discharge, pelvic pain, problems with intercourse or other gynecologic concerns.    Gynecologic History Patient's last menstrual period was 12/17/2019 (within days). Unsure about exact date of LMP. Reports it was sometime in the 2nd week of December  Contraception: none Last Pap: 2018. Results were: abnormal Last mammogram: NA, age.   Obstetric History OB History  Gravida Para Term Preterm AB Living  0            SAB IAB Ectopic Multiple Live Births               History reviewed. No pertinent past medical history.  History reviewed. No pertinent surgical history.  Current Outpatient Medications on File Prior to Visit  Medication Sig Dispense Refill  . Biotin w/ Vitamins C & E (HAIR/SKIN/NAILS PO) Take 1 tablet by mouth daily.     No current facility-administered medications on file prior to visit.    Allergies  Allergen Reactions  . Aspirin Other (See Comments)    "stomach bleeds" Can take Advil    Social History   Socioeconomic History  . Marital status: Single    Spouse name: Not on file  . Number of children: Not on file  . Years of education: Not on file  . Highest education level: Not on file  Occupational History  . Not on file  Tobacco Use  . Smoking status: Current Some Day Smoker    Packs/day: 0.50    Years: 16.00    Pack years: 8.00    Types: Cigarettes    Start date: 2004  . Smokeless tobacco: Never Used  . Tobacco comment: pt somokes some days  Vaping Use  . Vaping Use: Never used  Substance and Sexual Activity  . Alcohol use: Yes    Comment: Social  . Drug use: No  . Sexual activity:  Yes    Birth control/protection: Injection  Other Topics Concern  . Not on file  Social History Narrative  . Not on file   Social Determinants of Health   Financial Resource Strain: Not on file  Food Insecurity: No Food Insecurity  . Worried About Programme researcher, broadcasting/film/video in the Last Year: Never true  . Ran Out of Food in the Last Year: Never true  Transportation Needs: No Transportation Needs  . Lack of Transportation (Medical): No  . Lack of Transportation (Non-Medical): No  Physical Activity: Not on file  Stress: Not on file  Social Connections: Not on file  Intimate Partner Violence: Not on file    Family History  Problem Relation Age of Onset  . Diabetes Mother   . Hypertension Mother   . Stroke Father   . Hypertension Father     The following portions of the patient's history were reviewed and updated as appropriate: allergies, current medications, past family history, past medical history, past social history, past surgical history and problem list.  Review of Systems Pertinent items noted in HPI and remainder of comprehensive ROS otherwise negative.   Objective:  BP (!) 101/54   Pulse 80   Ht 5\' 3"  (1.6 m)   Wt 116 lb (52.6 kg)   LMP  12/17/2019 (Within Days)   BMI 20.55 kg/m  CONSTITUTIONAL: Well-developed, well-nourished female in no acute distress.  HENT:  Normocephalic, atraumatic, External right and left ear normal. Oropharynx is clear and moist EYES: Conjunctivae and EOM are normal. Pupils are equal, round, and reactive to light. No scleral icterus.  NECK: Normal range of motion, supple, no masses.  Normal thyroid.  SKIN: Skin is warm and dry. No rash noted. Not diaphoretic. No erythema. No pallor. NEUROLOGIC: Alert and oriented to person, place, and time. Normal reflexes, muscle tone coordination. No cranial nerve deficit noted. PSYCHIATRIC: Normal mood and affect. Normal behavior. Normal judgment and thought content. CARDIOVASCULAR: Normal heart rate noted,  regular rhythm RESPIRATORY: Clear to auscultation bilaterally. Effort and breath sounds normal, no problems with respiration noted. ABDOMEN: Soft, normal bowel sounds, no distention noted.  No tenderness, rebound or guarding.  PELVIC: Normal appearing external genitalia; normal appearing vaginal mucosa and cervix.  No abnormal discharge noted.  Pap smear obtained.  approx 10 weeks size uterus, FHT 175 with doppler  MUSCULOSKELETAL: Normal range of motion. No tenderness.  No cyanosis, clubbing, or edema.  2+ distal pulses.  Results for orders placed or performed in visit on 03/01/20 (from the past 24 hour(s))  Pregnancy, urine POC     Status: Abnormal   Collection Time: 03/01/20  1:25 PM  Result Value Ref Range   Preg Test, Ur POSITIVE (A) NEGATIVE     Assessment and Plan:  1. Well woman exam with routine gynecological exam - Cytology - PAP( Angie)  2. Amenorrhea  3. Incidental pregnancy - Prenatal panel today - GC/CT - [place] for dating   Return in approx 4 weeks for NOB visit   Will follow up results of pap smear and manage accordingly. Routine preventative health maintenance measures emphasized. Please refer to After Visit Summary for other counseling recommendations.   Thressa Sheller DNP, CNM  03/01/20  1:48 PM

## 2020-03-02 LAB — CBC/D/PLT+RPR+RH+ABO+RUB AB...
Antibody Screen: NEGATIVE
Basophils Absolute: 0 10*3/uL (ref 0.0–0.2)
Basos: 0 %
EOS (ABSOLUTE): 0.1 10*3/uL (ref 0.0–0.4)
Eos: 1 %
HCV Ab: <0.1 s/co ratio (ref 0.0–0.9)
HIV Screen 4th Generation wRfx: NONREACTIVE
Hematocrit: 35.2 % (ref 34.0–46.6)
Hemoglobin: 11.9 g/dL (ref 11.1–15.9)
Hepatitis B Surface Ag: NEGATIVE
Immature Grans (Abs): 0 10*3/uL (ref 0.0–0.1)
Immature Granulocytes: 0 %
Lymphocytes Absolute: 2.2 10*3/uL (ref 0.7–3.1)
Lymphs: 25 %
MCH: 32.8 pg (ref 26.6–33.0)
MCHC: 33.8 g/dL (ref 31.5–35.7)
MCV: 97 fL (ref 79–97)
Monocytes Absolute: 0.6 10*3/uL (ref 0.1–0.9)
Monocytes: 7 %
Neutrophils Absolute: 5.9 10*3/uL (ref 1.4–7.0)
Neutrophils: 67 %
Platelets: 197 10*3/uL (ref 150–450)
RBC: 3.63 x10E6/uL — ABNORMAL LOW (ref 3.77–5.28)
RDW: 12.7 % (ref 11.7–15.4)
RPR Ser Ql: NONREACTIVE
Rh Factor: POSITIVE
Rubella Antibodies, IGG: 1.68 index (ref >0.99)
WBC: 8.9 10*3/uL (ref 3.4–10.8)

## 2020-03-02 LAB — HCV INTERPRETATION

## 2020-03-09 ENCOUNTER — Ambulatory Visit
Admission: RE | Admit: 2020-03-09 | Discharge: 2020-03-09 | Disposition: A | Discharge status: Home / Self Care | Payer: BC Managed Care – PPO | Source: Ambulatory Visit | Attending: Advanced Practice Midwife | Admitting: Advanced Practice Midwife

## 2020-03-09 ENCOUNTER — Other Ambulatory Visit: Payer: Self-pay

## 2020-03-09 DIAGNOSIS — N912 Amenorrhea, unspecified: Secondary | ICD-10-CM | POA: Diagnosis present

## 2020-03-09 DIAGNOSIS — Z331 Pregnant state, incidental: Secondary | ICD-10-CM | POA: Diagnosis present

## 2020-03-09 DIAGNOSIS — Z01419 Encounter for gynecological examination (general) (routine) without abnormal findings: Secondary | ICD-10-CM | POA: Diagnosis present

## 2020-03-09 LAB — CYTOLOGY - PAP
Adequacy: ABSENT
Chlamydia: NEGATIVE
Comment: NEGATIVE
Comment: NEGATIVE
Comment: NEGATIVE
Comment: NEGATIVE
Comment: NORMAL
Diagnosis: NEGATIVE
HPV 16: NEGATIVE
HPV 18 / 45: NEGATIVE
High risk HPV: POSITIVE — AB
Neisseria Gonorrhea: NEGATIVE
Trichomonas: NEGATIVE

## 2020-03-13 ENCOUNTER — Telehealth: Payer: Self-pay | Admitting: Lactation Services

## 2020-03-13 NOTE — Telephone Encounter (Signed)
Patient called and asked nurse to call her back about lifting for her job.   Called patient back. She reports that she needs a doctors note for lifting as her job requires lifting up to 50 pounds. Pregnancy restriction letter placed in My Chart for patient to present to her employer. Patient voiced understanding.

## 2020-04-04 ENCOUNTER — Other Ambulatory Visit: Payer: Self-pay

## 2020-04-04 ENCOUNTER — Encounter: Payer: Self-pay | Admitting: Advanced Practice Midwife

## 2020-04-04 ENCOUNTER — Ambulatory Visit (INDEPENDENT_AMBULATORY_CARE_PROVIDER_SITE_OTHER): Payer: BC Managed Care – PPO | Admitting: Advanced Practice Midwife

## 2020-04-04 VITALS — BP 99/55 | HR 66 | Wt 122.9 lb

## 2020-04-04 DIAGNOSIS — O0990 Supervision of high risk pregnancy, unspecified, unspecified trimester: Secondary | ICD-10-CM | POA: Diagnosis not present

## 2020-04-04 DIAGNOSIS — Z3401 Encounter for supervision of normal first pregnancy, first trimester: Secondary | ICD-10-CM | POA: Diagnosis not present

## 2020-04-04 DIAGNOSIS — O09529 Supervision of elderly multigravida, unspecified trimester: Secondary | ICD-10-CM | POA: Diagnosis not present

## 2020-04-04 DIAGNOSIS — O09513 Supervision of elderly primigravida, third trimester: Secondary | ICD-10-CM | POA: Insufficient documentation

## 2020-04-04 DIAGNOSIS — Z23 Encounter for immunization: Secondary | ICD-10-CM | POA: Diagnosis not present

## 2020-04-04 DIAGNOSIS — O0993 Supervision of high risk pregnancy, unspecified, third trimester: Secondary | ICD-10-CM | POA: Insufficient documentation

## 2020-04-04 DIAGNOSIS — Z3403 Encounter for supervision of normal first pregnancy, third trimester: Secondary | ICD-10-CM | POA: Insufficient documentation

## 2020-04-04 LAB — POCT URINALYSIS DIP (DEVICE)
Bilirubin Urine: NEGATIVE
Glucose, UA: NEGATIVE mg/dL
Ketones, ur: NEGATIVE mg/dL
Leukocytes,Ua: NEGATIVE
Nitrite: NEGATIVE
Protein, ur: NEGATIVE mg/dL
Specific Gravity, Urine: >=1.03 (ref 1.005–1.030)
Urobilinogen, UA: 0.2 mg/dL (ref 0.0–1.0)
pH: 6 (ref 5.0–8.0)

## 2020-04-04 NOTE — Patient Instructions (Signed)
AREA PEDIATRIC/FAMILY PRACTICE PHYSICIANS  [place] ([address]) . [facility] o Chambliss, MD; Eniola, MD; Hale, MD; Hensel, MD; McDiarmid, MD; McIntyer, MD; Neal, MD; Walden, MD o [address] [place], [place] [address] o [phone] o Mon-Fri 8:30-12:30, 1:30-5:00 o Providers come to see babies at [facility] o Accepting Medicaid . [facility] o Limited providers who accept newborns: Koirala, MD; Morrow, MD; Wolters, MD o [address], [place], [place] [address] o [phone] o Mon-Fri 8:00-5:30 o Babies seen by providers at [facility] o Does NOT accept Medicaid o Please call early in hospitalization for appointment (limited availability)  . [facility] o Mulberry, MD o [address] [place], [place] [address] o [phone] o Mon, Tue, Thur, Fri 8:30-5:00, Wed 10:00-7:00 (closed 1-2pm) o Babies seen by [facility] providers o Accepting Medicaid . [facility] Pediatric[facility] o Rubin, MD o [address], [place], [place] [address] o [phone] o Mon-Fri 8:30-5:00, Sat 8:30-12:00 o Provider comes to see babies at [facility] o Accepting Medicaid o Must have been referred from current patients or contacted office prior to delivery . [facility] ([facility]) o Brown, MD; Chandler, MD; Ettefagh, MD; Grant, MD; Lester, MD; McCormick, MD; McQueen, MD; Prose, MD; Simha, MD; Stanley, MD; Stryffeler, NP; Tebben, NP o [address], [place], [place] [address] o [phone] o Mon, Tue, Thur, Fri 8:30-5:30, Wed 9:30-5:30, Sat 8:30-12:30 o Babies seen by [facility] providers o Accepting Medicaid o Only accepting infants of first-time parents or siblings of current patients o Hospital discharge coordinator will make follow-up appointment . Jack Amos o [address],  [place], [place]  [address] o [phone]   Fax - [phone] . [facility] o [address], [place], [place]  [address] o Phone - [phone]   Fax - [phone] . Shilpa Gosrani o [address], [place], [place]  [address] o [phone]  [place] ([address]) . [facility] o Bates, MD; Brassfield, MD; Cooper, Cox, MD; MD; Davis, MD; Dovico, MD; Ettefaugh, MD; Little, MD; Lowe, MD; Keiffer, MD; Melvin, MD; Sumner, MD; Williams, MD o [address], [place], [place] [address] o [phone] o Mon-Fri 8:30-5:00 (extended evenings Mon-Thur as needed), Sat-Sun 10:00-1:00 o Providers come to see babies at [facility] o Accepting Medicaid for families of first-time babies and families with all children in the household age 3 and under. Must register with office prior to making appointment (M-F only). . [facility] o Henson, NP; Knapp, MD; Lalonde, MD; Tysinger, PA o [address] [place], [place] [address] o [phone] o Mon-Fri 8:00-5:00 o Babies seen by providers at [facility] o Does NOT accept Medicaid/Commercial Insurance Only . [facility] ([facility])  o Dudzinski, MD; Barnes, MD; Bratton, MD; Coccaro, MD; Lockett Gardner, MD; Kramer, MD; Marshall, MD; Netherton, MD; Poleto, MD; Skinner, MD o [address] [place], [place] [address] o [phone] o Mon-Fri 8:30-5:30, Sat (Oct.-Mar.) 9:00-1:00 o Babies seen by providers at [facility] o Accepting Medicaid  [place] ([address]) . [facility] o Reid, MD; Warner, MD o [address], , [place] [address] o [phone] o Mon-Fri 8:30-5:00, Sat 8:30-12:00 o Providers come to see babies at [facility] o Does NOT accept Medicaid . [facility] o Becker, PA; Hagler, MD; Scifres, PA; Sun, MD; Swayne, MD o [address],  [place], [place] [address] o [phone] o Mon-Fri 8:00-5:00 o Babies seen by providers at [facility] o Does NOT accept Medicaid o Only accepting babies of parents who are patients o Please call early in hospitalization for appointment (limited availability) . [facility] o Clark, MD; Frye, MD; Kelleher, MD; Mack, NP; Miller, MD; O'Keller, MD; Patterson, NP; Pudlo, MD; Puzio, MD; Thomas, MD; Tucker, MD; Twiselton, MD o [address], [place], [place] [address] o [phone] o Mon-Fri 8:00-5:00, Sat 9:00-12:00 o Providers come to see babies at [facility] o Does NOT accept Medicaid  [place] ([address]) . [facility] o Limited providers accepting new patients: Brake, NP; Wharton, PA o [address], [place], [place] [address] o [phone] o Mon-Fri 8:00-5:00 o Babies seen by providers at [facility] o Does NOT accept Medicaid o Only accepting babies of parents who are patients o Please call early in hospitalization for appointment (limited availability) . [facility] o Gay, MD; Quinlan, MD o [address] [place], [place] [address] o [phone] (press 1 to schedule appointment) o Mon-Fri 8:00-5:00 o Providers come to see babies at [facility] o Does NOT accept Medicaid . [facility] o Mazer, MD o [address] [place], [place] [address] o [phone] o Mon-Fri 8:30-5:00 (lunch 12:30-1:00), extended hours by appointment only Wed 5:00-6:30 o Babies seen by [facility] providers o Accepting Medicaid . [facility] o Banks, MD; Jordan, MD; Koberlein, MD o [address], [place], [place] [address] o [phone] o Mon-Fri 8:00-5:00 o Babies seen by [facility] providers o Does NOT accept Medicaid . [facility] o Parker, MD; Hunter, MD; [place], DO o [address] [place], [place]  [address] o [phone] o Mon-Fri 8:00-5:00 o Babies seen by [facility] providers o Does NOT accept Medicaid . [facility] o Brandon, PA; Brecken, PA; Christy, NP; Dees, MD; DeClaire, MD; DeWeese, MD; Hansen, NP; Mills, NP; Parrish, NP; Smoot, NP; Summer, MD; Vapne, MD o [address] [place], [place] [address] o [phone] o Mon-Fri 8:30-5:00, Sat 10:00-1:00 o Providers come to see babies at [facility] o Does NOT accept Medicaid o Free prenatal information session Tuesdays at 4:45pm . [facility] o Bouska, MD; Gordon, PA; Jeffery, PA; Weber, PA o [address] [place] [address] o [phone] o Mon-Fri 7:30-5:30 o Babies seen by [facility] providers . [facility] o [address], [place], [place]  [address] o [phone]   Fax - [phone]  [place] ([address] & [address]) . [facility] o Reese, MD o [address] [place], [place] [address] o [phone] o Mon-Thur 8:00-6:00 o Providers come to see babies at [facility] o Accepting Medicaid . [facility] o Anderson, NP; Badger, MD; Beal, PA; Spencer, PA o [address] [place], [place] [address] o [phone] o Mon-Thur 7:30-7:30, Fri 7:30-4:30 o Babies seen by [facility] providers o Accepting Medicaid . [facility] o Agbuya, MD; Klett, NP; Romgoolam, MD o [address], [place],  [address] o [phone] o Mon-Fri 8:30-5:00, Sat 8:30-12:00 o Providers come to see babies at [facility] o Accepting Medicaid o Must have "Meet & Greet" appointment at office prior to delivery . [facility] o McCord,   MD; [place], MD; Wood, MD o [address], [place], [place] [address] o [phone] o Mon-Wed 8:00-6:00, Thur-Fri 8:00-5:00, Sat 9:00-12:00 o Providers come to  see babies at [facility] o Does NOT accept Medicaid o Only accepting siblings of current patients . [facility]  o [address], [place], [place]  [address] o [phone]   Fax - [phone] . [facility] o [address], [place], [place]  [address] o [phone]   Fax - [phone]  [place] ([address]) . [facility] o Cirigliano, DO; Matthews, DO o [address] [place], [place] [address] o [phone] o Mon-Fri 7:00-5:00 o Babies seen by [facility] providers o Does NOT accept Medicaid . [facility] o Briscoe, MD; Howley, PA; Moreira, PA o [address], [place], [place] [address] o [phone] o Mon-Fri 8:00-5:00 o Babies seen by [facility] providers o Accepting Medicaid . [facility] o Boyd, MD; Church, PA; Jones, NP; Osborn, PA o [address], , [place] [address] o [phone] o Mon-Fri 8:00-5:00 o Babies seen by providers at [facility] o Accepting Medicaid  [place] ([address]) . [facility] [place] o [place], DO o [address] [place], [place] [address] o [phone] o Mon-Fri 8:00-5:00 o Babies seen by [facility] providers o Does NOT accept Medicaid o Limited availability, please call early in hospitalization to schedule follow-up . [facility] o Calderon, PA; Cummings, MD; Dillard, MD; Martin, PA; Olson, MD; VanDeven, PA o [address], [place], [place] [address] o [phone] o Mon-Fri 8:30-5:00, Sat 9:00-12:00 o Babies seen by providers at [facility] o Accepting Medicaid o Please register online then schedule online or call office o [email or web address] . [facility] ([facility] o Hunter, NP; Kumar, MD; Martin Rogers, PA o [address], [place], [place] [address] o [phone] o Mon-Fri 8:00-5:00 o Babies seen by providers at [facility] o Accepting Medicaid . [facility] ([facility] o [place], MD; Kristi Fleenor, NP; West, MD o [address], [place], [place] [address] o [phone] o Mon-Fri 8:00-5:30, Sat&Sun by appointment (phones open at 8:30) o Babies seen by [facility] providers o Accepting Medicaid o Must be a first-time baby or sibling of current patient . [facility] [place]  o [address], [place], [place]  [address] o [phone]   Fax - [phone]  [place] ([address]) . [facility] o Brown, PA; Cowen, PA; Rice, MD; Helton, PA; Spry, MD o [address] [place], [place] [address] o [phone] o Mon-Thur 8:00-7:00, Fri 8:00-5:00, Sat 8:00-12:00, Sun 9:00-12:00 o Babies seen by [facility] providers o Accepting Medicaid . [facility] o Coe-Goins, MD; Marshall, MD; Pierre-Louis, MD o [address] [place] [address], [place], [place] [address] o [phone] o Mon-Thur 8:00-5:00 o Babies seen by providers at [facility] o Accepting Medicaid . [facility] o Bratton, MD; Coe-Goins, MD; Hayes, MD; Lewis, MD; List, MD; Lott, MD; Marshall, MD; Moran, MD; O'Neal, MD; Pierre-Louis, MD; Pitonzo, MD; Scholer, MD; Spangle, MD o [address] [place],    [address] o [phone] o Mon-Fri 8:00-5:30, Sat (Oct.-Mar.) 9:00-1:00 o Babies seen by providers at [facility] o Accepting Medicaid o Must fill out new patient packet, available online at [email or web address] . [facility] ([facility] o Friddle, NP; Harris, NP; Kelly, NP; Logan, MD;  Melvin, PA; Poth, MD; Ramadoss, MD; Stanton, NP o [address], [place], [place] [address] o [phone] o Mon-Thur 8:00-5:30, Fri 8:00-5:00 o Babies seen by providers at [facility] o Accepting Medicaid  [place] ([address]) . [facility] o Dixon, PA; [place], MD; Pickard, MD; Tapia, PA o [address], [place], [place] [address] o [phone] o Mon-Fri 8:00-5:00 o Babies seen by providers at [facility] o Accepting Medicaid   [place] ([address]) . [facility] [place] o Masneri, DO; Meyers, MD; [place], PA o [address], [place], [place] [address] o [phone] o Mon-Fri 8:00-5:00 o Babies seen by providers at [facility] o Does NOT accept Medicaid o Limited appointment availability, please call early in hospitalization  . [facility] [place] o Kunedd, DO; McGowen, MD o [address], [place], [place] [address] o [phone] o Mon-Fri 8:00-5:00 o Babies seen by [facility] providers o Does NOT accept Medicaid . [facility] [place] o Cameron, MD; MacDonald, MD; Michaels, PA; Nayak, MD o [address] [place] [address], [place], [place] [address] o [phone] o Mon-Fri 8:00-5:00 o After hours clinic ([address] [place], [place] [address]) [phone] Mon-Fri 5:00-8:00, Sat 12:00-6:00, Sun 10:00-4:00 o Babies seen by [facility] providers o Accepting Medicaid . [facility] [place] o [address], [place], [place]  [address] o [phone]   Fax - [phone]  [place] ([address]) . [facility] [place] [facility] o Andy, MD o [address], [place], [place] [address] o [phone] o Mon-Fri 8:00-5:00 o Babies seen by [facility] providers o Does NOT accept Medicaid . [facility] [place] ([facility] [place]) o Eksir, MD o [address], [place], [place]  [address] o [phone] o Mon-Thur 8:00-7:00, Fri 8:00-5:00, Sat 8:00-12:00 o Babies seen by providers at [facility] o Accepting Medicaid - but does not have vaccinations in office (must be received elsewhere) o Limited availability, please call early in hospitalization  [place] ([address]) . [facility]  o Charlene Flemming, MD o [address], [place] [address] o [phone]  Fax [phone]   

## 2020-04-04 NOTE — Progress Notes (Signed)
Subjective:   Dominique Webb is a 35 y.o. G1P0 at [redacted]w[redacted]d by 12 week [place] being seen today for her first obstetrical visit.    Patient was seen one month ago for annual and had a +UPT at that time.   Gynecological/Obstetrical History: Her obstetrical history is significant for advanced maternal age. Patient does intend to breast feed. Pregnancy history fully reviewed. Patient reports no complaints. Sexual Activity and Vaginal Concerns  Medical History/ROS:  Social History: Patient denies current usage of tobacco, alcohol, or drugs. She reports that she was a smoker, but since finding out she was pregnant she quit.    Patient reports the FOB is involved and supportive. They live together. His name is Channing Mutters. 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 She has some FMLA papers that need to be completed.   HISTORY: OB History  Gravida Para Term Preterm AB Living  1 0 0 0 0 0  SAB IAB Ectopic Multiple Live Births  0 0 0 0 0    # Outcome Date GA Lbr Len/2nd Weight Sex Delivery Anes PTL Lv  1 Current             Last pap smear was done 2/22 and was normal  History reviewed. No pertinent past medical history. History reviewed. No pertinent surgical history. Family History  Problem Relation Age of Onset  . Diabetes Mother   . Hypertension Mother   . Hyperlipidemia Mother   . Stroke Father   . Hypertension Father    Social History   Tobacco Use  . Smoking status: Former Smoker    Packs/day: 0.50    Years: 16.00    Pack years: 8.00    Types: Cigarettes    Start date: 2004  . Smokeless tobacco: Never Used  . Tobacco comment: pt somokes some days  Vaping Use  . Vaping Use: Never used  Substance Use Topics  . Alcohol use: Not Currently    Comment: Social  . Drug use: No   Allergies  Allergen Reactions  . Aspirin Other (See Comments)    "stomach bleeds" Can take Advil   Current Outpatient Medications on File Prior to Visit   Medication Sig Dispense Refill  . Biotin w/ Vitamins C & E (HAIR/SKIN/NAILS PO) Take 1 tablet by mouth daily. (Patient not taking: Reported on 04/04/2020)     No current facility-administered medications on file prior to visit.    Review of Systems Pertinent items noted in HPI and remainder of comprehensive ROS otherwise negative.  Exam   Vitals:   04/04/20 0935  BP: (!) 99/55  Pulse: 66  Weight: 122 lb 14.4 oz (55.7 kg)   Fetal Heart Rate (bpm): 145  Physical Exam Constitutional:      General: She is not in acute distress. HENT:     Head: Normocephalic.  Cardiovascular:     Rate and Rhythm: Normal rate.  Pulmonary:     Effort: Pulmonary effort is normal.  Abdominal:     Palpations: Abdomen is soft.  Neurological:     Mental Status: She is alert and oriented to person, place, and time.  Skin:    General: Skin is warm and dry.  Psychiatric:        Mood and Affect: Mood normal.        Behavior: Behavior normal.  Vitals and nursing note reviewed.     Assessment:   35 y.o. year  old G1P0 Patient Active Problem List   Diagnosis Date Noted  . Supervision of high risk pregnancy, antepartum 04/04/2020  . AMA (advanced maternal age) multigravida 35+ 04/04/2020  . Encounter for supervision of normal first pregnancy in first trimester 03/01/2020  . Suspected COVID-19 virus infection 11/06/2018  . ASCUS of cervix with negative high risk HPV 09/26/2016  . Tobacco abuse 09/19/2016  . Abnormal uterine bleeding (AUB) 09/19/2016  . Nexplanon in place 09/19/2016     Plan:  1. Supervision of high risk pregnancy, antepartum - labs done at annual exam last month - Genetic testing today   2. Antepartum multigravida of advanced maternal age - Panorama and AFP today - Will get anatomy scan with MFM for next week.   3. Encounter for supervision of normal first pregnancy in first trimester   Problem list reviewed and updated. Routine obstetric precautions  reviewed.  Orders Placed This Encounter  Procedures  . Culture, OB Urine  . [place] MFM OB COMP + 14 WK    Standing Status:   Future    Standing Expiration Date:   04/04/2021    Order Specific Question:   Reason for Exam (SYMPTOM  OR DIAGNOSIS REQUIRED)    Answer:   Anatomy    Order Specific Question:   Preferred Location    Answer:   [facility] Ul[facility]ound  . Hemoglobin A1c  . Genetic Screening    Panorama  . AFP, Serum, Open Spina Bifida    Order Specific Question:   Is patient insulin dependent?    Answer:   No    Order Specific Question:   Weight (lbs)    Answer:   47    Order Specific Question:   Gestational Age (GA), weeks    Answer:   16.1    Order Specific Question:   Date on which patient was at this GA    Answer:   04/04/2020    Order Specific Question:   GA Calculation Method    Answer:   Ultrasound    Order Specific Question:   Number of fetuses    Answer:   1    Return in about 4 weeks (around 05/02/2020).  Thressa Sheller DNP, CNM  04/04/20  9:59 AM

## 2020-04-04 NOTE — Progress Notes (Signed)
Anatomy U/S scheduled for April 26th @ 1300.  Pt notified.   Addison Naegeli, RN  04/04/20

## 2020-04-05 ENCOUNTER — Encounter: Payer: Self-pay | Admitting: *Deleted

## 2020-04-06 LAB — AFP, SERUM, OPEN SPINA BIFIDA
AFP MoM: 1.25
AFP Value: 55.8 ng/mL
Gest. Age on Collection Date: 16.1 weeks
Maternal Age At EDD: 37.5 yr
OSBR Risk 1 IN: 10000
Test Results:: NEGATIVE
Weight: 122 [lb_av]

## 2020-04-06 LAB — CULTURE, OB URINE

## 2020-04-06 LAB — URINE CULTURE, OB REFLEX

## 2020-04-06 LAB — HEMOGLOBIN A1C
Est. average glucose Bld gHb Est-mCnc: 105 mg/dL
Hgb A1c MFr Bld: 5.3 % (ref 4.8–5.6)

## 2020-04-10 ENCOUNTER — Telehealth: Payer: Self-pay | Admitting: *Deleted

## 2020-04-10 MED ORDER — PRENATAL VITAMINS 28-0.8 MG PO TABS: 1.0000 | ORAL_TABLET | Freq: Every day | ORAL | 12 refills | Status: DC

## 2020-04-10 NOTE — Telephone Encounter (Signed)
Pt left VM message stating that her prescription for prenatals was not sent to her pharmacy. I called pt and informed that I have sent in the prescription. She voiced understanding.

## 2020-04-12 ENCOUNTER — Encounter: Payer: Self-pay | Admitting: *Deleted

## 2020-04-17 ENCOUNTER — Encounter: Payer: Self-pay | Admitting: *Deleted

## 2020-04-18 ENCOUNTER — Encounter: Payer: Self-pay | Admitting: General Practice

## 2020-05-02 ENCOUNTER — Ambulatory Visit (INDEPENDENT_AMBULATORY_CARE_PROVIDER_SITE_OTHER): Payer: BC Managed Care – PPO | Admitting: Advanced Practice Midwife

## 2020-05-02 ENCOUNTER — Encounter: Payer: Self-pay | Admitting: Advanced Practice Midwife

## 2020-05-02 ENCOUNTER — Ambulatory Visit (HOSPITAL_BASED_OUTPATIENT_CLINIC_OR_DEPARTMENT_OTHER): Payer: BC Managed Care – PPO

## 2020-05-02 ENCOUNTER — Other Ambulatory Visit: Payer: Self-pay

## 2020-05-02 ENCOUNTER — Other Ambulatory Visit (HOSPITAL_COMMUNITY)
Admission: RE | Admit: 2020-05-02 | Discharge: 2020-05-02 | Disposition: A | Discharge status: Home / Self Care | Payer: BC Managed Care – PPO | Source: Ambulatory Visit | Attending: Advanced Practice Midwife | Admitting: Advanced Practice Midwife

## 2020-05-02 ENCOUNTER — Other Ambulatory Visit: Payer: Self-pay | Admitting: Advanced Practice Midwife

## 2020-05-02 ENCOUNTER — Other Ambulatory Visit: Payer: Self-pay | Admitting: *Deleted

## 2020-05-02 VITALS — BP 95/49 | HR 75 | Wt 133.5 lb

## 2020-05-02 DIAGNOSIS — N898 Other specified noninflammatory disorders of vagina: Secondary | ICD-10-CM | POA: Insufficient documentation

## 2020-05-02 DIAGNOSIS — O0990 Supervision of high risk pregnancy, unspecified, unspecified trimester: Secondary | ICD-10-CM

## 2020-05-02 DIAGNOSIS — Z3402 Encounter for supervision of normal first pregnancy, second trimester: Secondary | ICD-10-CM

## 2020-05-02 DIAGNOSIS — Z3A20 20 weeks gestation of pregnancy: Secondary | ICD-10-CM

## 2020-05-02 DIAGNOSIS — O26892 Other specified pregnancy related conditions, second trimester: Secondary | ICD-10-CM

## 2020-05-02 DIAGNOSIS — O09513 Supervision of elderly primigravida, third trimester: Secondary | ICD-10-CM

## 2020-05-02 DIAGNOSIS — M545 Low back pain, unspecified: Secondary | ICD-10-CM

## 2020-05-02 LAB — POCT URINALYSIS DIP (DEVICE)
Bilirubin Urine: NEGATIVE
Glucose, UA: NEGATIVE mg/dL
Ketones, ur: NEGATIVE mg/dL
Leukocytes,Ua: NEGATIVE
Nitrite: NEGATIVE
Protein, ur: NEGATIVE mg/dL
Specific Gravity, Urine: >=1.03 (ref 1.005–1.030)
Urobilinogen, UA: 0.2 mg/dL (ref 0.0–1.0)
pH: 6.5 (ref 5.0–8.0)

## 2020-05-02 NOTE — Progress Notes (Signed)
Patient complains of lower back pain that started 2 weeks ago

## 2020-05-02 NOTE — Patient Instructions (Signed)

## 2020-05-02 NOTE — Progress Notes (Signed)
   PRENATAL VISIT NOTE  Subjective:  Dominique Webb is a 35 y.o. G1P0 at [redacted]w[redacted]d being seen today for ongoing prenatal care.  She is currently monitored for the following issues for this low-risk pregnancy and has Tobacco abuse; Abnormal uterine bleeding (AUB); Nexplanon in place; ASCUS of cervix with negative high risk HPV; Suspected COVID-19 virus infection; Encounter for supervision of normal first pregnancy in first trimester; Supervision of high risk pregnancy, antepartum; and AMA (advanced maternal age) multigravida 35+ on their problem list.  Patient reports backache for the past two weeks. PAtient works in [employer] and endorse recurrent lifting, bending and twisting. She denies flank pain, dysuria, abnormal vaginal discharge.  Contractions: Not present. Vag. Bleeding: None.  Movement: Present. Denies leaking of fluid.   The following portions of the patient's history were reviewed and updated as appropriate: allergies, current medications, past family history, past medical history, past social history, past surgical history and problem list. Problem list updated.  Objective:   Vitals:   05/02/20 1100  BP: (!) 95/49  Pulse: 75  Weight: 133 lb 8 oz (60.6 kg)    Fetal Status: Fetal Heart Rate (bpm): 148   Movement: Present     General:  Alert, oriented and cooperative. Patient is in no acute distress.  Skin: Skin is warm and dry. No rash noted.   Cardiovascular: Normal heart rate noted  Respiratory: Normal respiratory effort, no problems with respiration noted  Abdomen: Soft, gravid, appropriate for gestational age.  Pain/Pressure: Present     Pelvic: Cervical exam deferred        Extremities: Normal range of motion.  Edema: None  Mental Status: Normal mood and affect. Normal behavior. Normal judgment and thought content.   Assessment and Plan:  Pregnancy: G1P0 at [redacted]w[redacted]d  1. Encounter for supervision of normal first pregnancy in second trimester   2. Vaginal discharge  -  Cervicovaginal ancillary only( Rafael Capo)  3. [redacted] weeks gestation of pregnancy   4. Low back pain during pregnancy in second trimester - Tylenol 650 mg q 4 hours PRN - Consider maternity belt during work hours  Preterm labor symptoms and general obstetric precautions including but not limited to vaginal bleeding, contractions, leaking of fluid and fetal movement were reviewed in detail with the patient. Please refer to After Visit Summary for other counseling recommendations.  No follow-ups on file.  Future Appointments  Date Time Provider Department Center  05/30/2020 10:55 AM Gita Kudo, MD [facility]  07/17/2020 10:45 AM [facility] NURSE [facility]  07/17/2020 11:00 AM [facility] US1 [facility]    Calvert Cantor, CNM

## 2020-05-02 NOTE — Progress Notes (Signed)
Rates occasional lower back pain about a 5

## 2020-05-03 LAB — CERVICOVAGINAL ANCILLARY ONLY
Bacterial Vaginitis (gardnerella): POSITIVE — AB
Candida Glabrata: NEGATIVE
Candida Vaginitis: POSITIVE — AB
Comment: NEGATIVE
Comment: NEGATIVE
Comment: NEGATIVE

## 2020-05-04 ENCOUNTER — Telehealth: Payer: Self-pay

## 2020-05-04 NOTE — Telephone Encounter (Addendum)
-----   Message from Calvert Cantor, [place] sent at 05/04/2020  6:52 AM EDT ----- Regarding: FW: Positive for yeast and BV. Please initiate treatment and notify. Thank you! SW, CNM ----- Message ----- From: Interface, Lab In [facility] Sent: 05/03/2020   3:41 PM EDT To: Calvert Cantor, CNM  Left message that I am calling with non urgent results and tx if she could please contact the office or send a MyChart message.    Leonette Nutting  05/04/20

## 2020-05-05 MED ORDER — FLUCONAZOLE 150 MG PO TABS: 150.0000 mg | ORAL_TABLET | Freq: Once | ORAL | 0 refills | Status: AC

## 2020-05-05 MED ORDER — METRONIDAZOLE 500 MG PO TABS: 500.0000 mg | ORAL_TABLET | Freq: Two times a day (BID) | ORAL | 0 refills | Status: DC

## 2020-05-05 NOTE — Telephone Encounter (Signed)
Spoke with pt. Pt given results and recommendations per Clayton Bibles, CNM. Pt verbalize understanding and agreeable to plan of care. Rx Flagyl and Diflucan sent to pharmacy per protocol.   Judeth Cornfield, RN  05/05/20

## 2020-05-05 NOTE — Addendum Note (Signed)
Addended by: Isabell Jarvis on: 05/05/2020 08:51 AM   Modules accepted: Orders

## 2020-05-10 ENCOUNTER — Telehealth: Payer: Self-pay | Admitting: Lactation Services

## 2020-05-10 NOTE — Telephone Encounter (Signed)
Patient called and LM that she needs her PNV reordered as she has changed pharmacies. She did not leave information on new pharmacy.

## 2020-05-11 NOTE — Telephone Encounter (Signed)
Returned patients call. She would like to transfer her prescription to [facility].    Advised patient to call [facility] and request a prescription transfer from old pharmacy. Patient voiced understanding.

## 2020-05-25 ENCOUNTER — Ambulatory Visit (HOSPITAL_COMMUNITY)
Admission: EM | Admit: 2020-05-25 | Discharge: 2020-05-25 | Disposition: A | Discharge status: Home / Self Care | Payer: BC Managed Care – PPO

## 2020-05-25 ENCOUNTER — Other Ambulatory Visit: Payer: Self-pay

## 2020-05-25 ENCOUNTER — Encounter (HOSPITAL_COMMUNITY): Payer: Self-pay | Admitting: Obstetrics & Gynecology

## 2020-05-25 ENCOUNTER — Inpatient Hospital Stay (HOSPITAL_BASED_OUTPATIENT_CLINIC_OR_DEPARTMENT_OTHER): Payer: BC Managed Care – PPO

## 2020-05-25 ENCOUNTER — Inpatient Hospital Stay (HOSPITAL_COMMUNITY)
Admission: AD | Admit: 2020-05-25 | Discharge: 2020-05-25 | Disposition: A | Discharge status: Home / Self Care | Payer: BC Managed Care – PPO | Attending: Obstetrics & Gynecology | Admitting: Obstetrics & Gynecology

## 2020-05-25 DIAGNOSIS — Z3A23 23 weeks gestation of pregnancy: Secondary | ICD-10-CM | POA: Diagnosis not present

## 2020-05-25 DIAGNOSIS — R109 Unspecified abdominal pain: Secondary | ICD-10-CM | POA: Diagnosis not present

## 2020-05-25 DIAGNOSIS — O26892 Other specified pregnancy related conditions, second trimester: Secondary | ICD-10-CM | POA: Diagnosis not present

## 2020-05-25 DIAGNOSIS — Z886 Allergy status to analgesic agent status: Secondary | ICD-10-CM | POA: Insufficient documentation

## 2020-05-25 DIAGNOSIS — Z87891 Personal history of nicotine dependence: Secondary | ICD-10-CM | POA: Insufficient documentation

## 2020-05-25 LAB — URINALYSIS, ROUTINE W REFLEX MICROSCOPIC
Bilirubin Urine: NEGATIVE
Glucose, UA: NEGATIVE mg/dL
Hgb urine dipstick: NEGATIVE
Ketones, ur: NEGATIVE mg/dL
Leukocytes,Ua: NEGATIVE
Nitrite: NEGATIVE
Protein, ur: NEGATIVE mg/dL
Specific Gravity, Urine: 1.016 (ref 1.005–1.030)
pH: 7 (ref 5.0–8.0)

## 2020-05-25 MED ORDER — NIFEDIPINE 10 MG PO CAPS
10.0000 mg | ORAL_CAPSULE | Freq: Once | ORAL | Status: AC
  Administered 2020-05-25: 10 mg via ORAL
  Filled 2020-05-25: qty 1

## 2020-05-25 MED ORDER — ACETAMINOPHEN 500 MG PO TABS
1000.0000 mg | ORAL_TABLET | Freq: Once | ORAL | Status: AC
  Administered 2020-05-25: 1000 mg via ORAL
  Filled 2020-05-25: qty 2

## 2020-05-25 NOTE — ED Notes (Signed)
Abdominal pain started last night.  No known injury.  Touches top of abdomen and low abdomen as location of pain.  Denies vaginal discharge, bleeding.  Spoke to laura, np, will speak with patient

## 2020-05-25 NOTE — Discharge Instructions (Signed)
Activity Restriction During Pregnancy Your health care provider may recommend specific activity restrictions during pregnancy for a variety of reasons. Activity restriction may require that you limit activities that require great effort, such as exercise, lifting, or sex. The type of activity restriction will vary for each person, depending on your risk or the problems you are having. Activity restriction may be recommended for a period of time until your baby is delivered. Why are activity restrictions recommended? Activity restriction may be recommended if:  Your placenta is partially or completely covering the opening of your cervix (placenta previa).  There is bleeding between the wall of the uterus and the amniotic sac in the first trimester of pregnancy (subchorionic hemorrhage).  You went into labor too early (preterm labor).  You have a history of miscarriage.  You have a condition that causes high blood pressure during pregnancy (preeclampsia or eclampsia).  You are pregnant with more than one baby.  Your baby is not growing well. What are the risks? The risks depend on your specific restriction. Strict bed rest has the most physical and emotional risks and is no longer routinely recommended. Risks of strict bed rest include:  Loss of muscle conditioning from not moving.  Blood clots.  Social isolation.  Depression.  Loss of income. Talk with your health care team about activity restriction to decide if it is best for you and your baby. Even if you are having problems during your pregnancy, you may be able to continue with normal levels of activity with careful monitoring by your health care team. Follow these instructions at home: If needed, based on your overall health and the health of your baby, your health care provider will decide which type of activity restriction is right for you. Activity restrictions may include:  Not lifting anything heavier than 10 pounds (4.5  kg).  Avoiding activities that take a lot of physical effort.  No lifting or straining.  Resting in a sitting position or lying down for periods of time during the day. Pelvic rest may be recommended along with activity restrictions. If pelvic rest is recommended, then:  Do not have sex, an orgasm, or use sexual stimulation.  Do not use tampons. Do not douche. Do not put anything into your vagina.  Do not lift anything that is heavier than 10 lb (4.5 kg).  Avoid activities that require a lot of effort.  Avoid any activity in which your pelvic muscles could become strained, such as squatting.   Questions to ask your health care provider  Why is my activity being limited?  How will activity restrictions affect my body?  Why is rest helpful for me and my baby?  What activities can I do?  When can I return to normal activities? When should I seek immediate medical care? Seek immediate medical care if you have:  Vaginal bleeding.  Vaginal discharge.  Cramping pain in your lower abdomen.  Regular contractions.  A low, dull backache. Summary  Your health care provider may recommend specific activity restrictions during pregnancy for a variety of reasons.  Activity restriction may require that you limit activities such as exercise, lifting, sex, or any other activity that requires great effort.  Discuss the risks and benefits of activity restriction with your health care team to decide if it is best for you and your baby.  Contact your health care provider right away if you think you are having contractions, or if you notice vaginal bleeding, discharge, or cramping. This information   is not intended to replace advice given to you by your health care provider. Make sure you discuss any questions you have with your health care provider. Document Revised: 09/16/2018 Document Reviewed: 04/15/2017 Elsevier Patient Education  2021 Elsevier Inc.  

## 2020-05-25 NOTE — MAU Provider Note (Signed)
History     CSN: [id number]  Arrival date and time: 05/25/20 1538   Event Date/Time   First Provider Initiated Contact with Patient 05/25/20 1636      Chief Complaint  Patient presents with  . Abdominal Pain   HPI  [name] is a 35 y.o. female G1P0 @ [redacted]w[redacted]d here in MAU with all over abdominal pain. The pain started last night. She reports the pain has not worsened since last night. She currently rates her pain 7/10; She has not taken anything for the pain. She feels the pain off and on; the pain comes and goes. The pain feels like menstrual cramping. Movement does not make the pain better or worse.  No recent intercourse, no bleeding. + fetal movement.   OB History    Gravida  1   Para      Term      Preterm      AB      Living        SAB      IAB      Ectopic      Multiple      Live Births              History reviewed. No pertinent past medical history.  History reviewed. No pertinent surgical history.  Family History  Problem Relation Age of Onset  . Diabetes Mother   . Hypertension Mother   . Hyperlipidemia Mother   . Stroke Father   . Hypertension Father     Social History   Tobacco Use  . Smoking status: Former Smoker    Packs/day: 0.50    Years: 16.00    Pack years: 8.00    Types: Cigarettes    Start date: 2004  . Smokeless tobacco: Never Used  . Tobacco comment: pt somokes some days  Vaping Use  . Vaping Use: Never used  Substance Use Topics  . Alcohol use: Not Currently    Comment: Social  . Drug use: No    Allergies:  Allergies  Allergen Reactions  . Aspirin Other (See Comments)    "stomach bleeds" Can take Advil    Medications Prior to Admission  Medication Sig Dispense Refill Last Dose  . Prenatal Vit-Fe Fumarate-FA (PRENATAL VITAMINS) 28-0.8 MG TABS Take 1 tablet by mouth daily. 30 tablet 12 05/24/2020 at Unknown time  . Biotin w/ Vitamins C & E (HAIR/SKIN/NAILS PO) Take 1 tablet by mouth daily. (Patient  not taking: No sig reported)     . metroNIDAZOLE (FLAGYL) 500 MG tablet Take 1 tablet (500 mg total) by mouth 2 (two) times daily. (Patient not taking: Reported on 05/25/2020) 14 tablet 0 Not Taking at Unknown time   Results for orders placed or performed during the hospital encounter of 05/25/20 (from the past 48 hour(s))  Urinalysis, Routine w reflex microscopic Urine, Clean Catch     Status: Abnormal   Collection Time: 05/25/20  3:42 PM  Result Value Ref Range   Color, Urine YELLOW YELLOW   APPearance HAZY (A) CLEAR   Specific Gravity, Urine 1.016 1.005 - 1.030   pH 7.0 5.0 - 8.0   Glucose, UA NEGATIVE NEGATIVE mg/dL   Hgb urine dipstick NEGATIVE NEGATIVE   Bilirubin Urine NEGATIVE NEGATIVE   Ketones, ur NEGATIVE NEGATIVE mg/dL   Protein, ur NEGATIVE NEGATIVE mg/dL   Nitrite NEGATIVE NEGATIVE   Leukocytes,Ua NEGATIVE NEGATIVE    Comment: Performed at [facility], [address] [place],  [place] [address]   No results found.  Review of Systems  Constitutional: Negative for fever.  Gastrointestinal: Positive for abdominal pain. Negative for constipation, diarrhea, nausea and vomiting.  Genitourinary: Negative for vaginal bleeding and vaginal discharge.   Physical Exam   Blood pressure (!) 115/53, pulse 75, temperature 98.7 F (37.1 C), resp. rate 18, height 5\' 3"  (1.6 m), weight 63.9 kg, last menstrual period 12/17/2019, SpO2 100 %.  Physical Exam Constitutional:      General: She is not in acute distress.    Appearance: She is well-developed. She is not ill-appearing, toxic-appearing or diaphoretic.  HENT:     Head: Normocephalic.  Eyes:     Pupils: Pupils are equal, round, and reactive to light.  Genitourinary:    Comments: Cervix closed, anterior. Exam by 14/10/2019, NP  Skin:    General: Skin is warm.  Neurological:     Mental Status: She is alert and oriented to person, place, and time.   Fetal Tracing: Baseline: 135 bpm Variability: Moderate   Accelerations: 10x10 Decelerations: None Toco: Occasional   MAU Course  Procedures  None  MDM  Cervical length 3.6 cm on Venia Carbon.  Tylenol 1 gram PO and procardia 10 mg x 1 dose.  Patient reports pain is 3/10 and much better.   Assessment and Plan   A:  1. [redacted] weeks gestation of pregnancy   2. Abdominal pain in pregnancy, second trimester     P:  Discharge home in stable condition Return to MAU if symptoms worsen Preterm labor precautions Pelvic rest Ok to take tylenol as directed on the bottle.   [place] I, NP 05/25/2020 8:00 PM

## 2020-05-25 NOTE — ED Notes (Signed)
Patient sent to mau by provider

## 2020-05-25 NOTE — MAU Note (Signed)
Dominique Webb is a 35 y.o. at [redacted]w[redacted]d here in MAU reporting: pt is having lower abdominal pain since last night (05/24/2020) around 2100 7/10. No abnormal discharge or bleeding noted.  Onset of complaint: 05/24/2020 Pain score: 7/10 Vitals:   05/25/20 1604  BP: (!) 115/53  Pulse: 75  Resp: 18  Temp: 98.7 F (37.1 C)  SpO2: 100%     FHT:  Lab orders placed from triage: UA

## 2020-05-26 DIAGNOSIS — O26892 Other specified pregnancy related conditions, second trimester: Secondary | ICD-10-CM

## 2020-05-26 DIAGNOSIS — R109 Unspecified abdominal pain: Secondary | ICD-10-CM

## 2020-05-26 DIAGNOSIS — Z3A23 23 weeks gestation of pregnancy: Secondary | ICD-10-CM | POA: Diagnosis not present

## 2020-05-29 ENCOUNTER — Telehealth: Payer: Self-pay | Admitting: Lactation Services

## 2020-05-29 NOTE — Telephone Encounter (Signed)
Patient called with concerns of dizziness and hot flashes. She would like a call back.   Returned patient's call. Patient is at work. Patient reports dizzy spells that started 2 weeks ago, last week she was dizzy 3 times and then again today. She reports she can sit and get dizzy, the precipitating factors change from time to time.   She reports she is eating and drinking well. 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 Patient is able to sit down as needed and can go to the break room to cool down. She has a fan at her station that is helping some.   Patient reports her BP cuff is broken.   She denies headaches, denies blurred vision, reports slight swelling to her feet that is newer, she denies swelling to hands or face. Baby is moving well. She has no history of BP issues and BP last week in MAU was 115/53.   She has an appointment in the office in the morning. She is planning to go home early from work today. She was advised to put her feet up and drink plenty of fluids once home. She was advised that if she develops any other symptoms she is to go to the MAU today/tonight for evaluation.   Patient with no further questions or concerns.

## 2020-05-30 ENCOUNTER — Encounter: Payer: Self-pay | Admitting: Family Medicine

## 2020-05-30 ENCOUNTER — Ambulatory Visit (INDEPENDENT_AMBULATORY_CARE_PROVIDER_SITE_OTHER): Payer: BC Managed Care – PPO | Admitting: Obstetrics and Gynecology

## 2020-05-30 ENCOUNTER — Other Ambulatory Visit: Payer: Self-pay

## 2020-05-30 VITALS — BP 94/56 | HR 70 | Wt 144.3 lb

## 2020-05-30 DIAGNOSIS — Z3402 Encounter for supervision of normal first pregnancy, second trimester: Secondary | ICD-10-CM

## 2020-05-30 DIAGNOSIS — Z3A24 24 weeks gestation of pregnancy: Secondary | ICD-10-CM

## 2020-05-30 NOTE — Patient Instructions (Signed)

## 2020-05-30 NOTE — Progress Notes (Signed)
Patient complains of dizziness and had contractions last Wednesday and Thursday

## 2020-05-30 NOTE — Progress Notes (Signed)
   PRENATAL VISIT NOTE  Subjective:  Dominique Webb is a 35 y.o. G1P0 at [redacted]w[redacted]d being seen today for ongoing prenatal care.  She is currently monitored for the following issues for this low-risk pregnancy and has Tobacco abuse; Abnormal uterine bleeding (AUB); ASCUS of cervix with negative high risk HPV; Encounter for supervision of normal first pregnancy in first trimester; Supervision of high risk pregnancy, antepartum; and AMA (advanced maternal age) multigravida 35+ on their problem list.  Patient reports no complaints today.  Contractions: Not present. Vag. Bleeding: None.  Movement: Present. Denies leaking of fluid.   Called yesterday about dizziness for past 2-3 weeks. Works in [employer] that is very hot. No air conditioning and sometimes works ten hour days. Seen in MAU 5/19, unremarkable workup. Feels fine now - just gets lightheaded and tired while working. Improves with rest.   The following portions of the patient's history were reviewed and updated as appropriate: allergies, current medications, past family history, past medical history, past social history, past surgical history and problem list.   Objective:   Vitals:   05/30/20 1110  BP: (!) 94/56  Pulse: 70  Weight: 144 lb 4.8 oz (65.5 kg)    Fetal Status: Fetal Heart Rate (bpm): 144   Movement: Present     General:  Alert, oriented and cooperative. Patient is in no acute distress.  Skin: Skin is warm and dry. No rash noted.   Cardiovascular: Normal heart rate noted  Respiratory: Normal respiratory effort, no problems with respiration noted  Abdomen: Soft, gravid, appropriate for gestational age.  Pain/Pressure: Absent     Pelvic: Cervical exam deferred        Extremities: Normal range of motion.  Edema: Trace  Mental Status: Normal mood and affect. Normal behavior. Normal judgment and thought content.   Assessment and Plan:  Pregnancy: G1P0 at [redacted]w[redacted]d 1. Encounter for supervision of normal first pregnancy in second  trimester -discussed taking breaks at work, staying hydrated. Will fill out paperwork to request increase in breaks.    2. [redacted] weeks gestation of pregnancy   Preterm labor symptoms and general obstetric precautions including but not limited to vaginal bleeding, contractions, leaking of fluid and fetal movement were reviewed in detail with the patient. Please refer to After Visit Summary for other counseling recommendations.   Return in about 4 weeks (around 06/27/2020) for OB.  Future Appointments  Date Time Provider Department Center  06/29/2020 10:55 AM Madlyn Frankel [facility]  07/17/2020 10:45 AM [facility] NURSE [facility]  07/17/2020 11:00 AM [facility] US1 [facility]    Gita Kudo, MD

## 2020-06-13 ENCOUNTER — Encounter: Payer: Self-pay | Admitting: Family

## 2020-06-13 ENCOUNTER — Telehealth (INDEPENDENT_AMBULATORY_CARE_PROVIDER_SITE_OTHER): Payer: BC Managed Care – PPO | Admitting: Family

## 2020-06-13 ENCOUNTER — Other Ambulatory Visit: Payer: Self-pay

## 2020-06-13 ENCOUNTER — Other Ambulatory Visit: Payer: Self-pay | Admitting: Family

## 2020-06-13 DIAGNOSIS — Z3A26 26 weeks gestation of pregnancy: Secondary | ICD-10-CM | POA: Diagnosis not present

## 2020-06-13 DIAGNOSIS — J069 Acute upper respiratory infection, unspecified: Secondary | ICD-10-CM

## 2020-06-13 NOTE — Progress Notes (Signed)
  Dominique Webb is a 35 y.o. female with the following history as recorded in EpicCare:  Patient Active Problem List   Diagnosis Date Noted  . Supervision of high risk pregnancy, antepartum 04/04/2020  . AMA (advanced maternal age) multigravida 35+ 04/04/2020  . Encounter for supervision of normal first pregnancy in first trimester 03/01/2020  . ASCUS of cervix with negative high risk HPV 09/26/2016  . Tobacco abuse 09/19/2016  . Abnormal uterine bleeding (AUB) 09/19/2016    Current Outpatient Medications  Medication Sig Dispense Refill  . Prenatal Vit-Fe Fumarate-FA (PRENATAL VITAMINS) 28-0.8 MG TABS Take 1 tablet by mouth daily. 30 tablet 12   No current facility-administered medications for this visit.    Allergies: Aspirin  No past medical history on file.  No past surgical history on file.  Family History  Problem Relation Age of Onset  . Diabetes Mother   . Hypertension Mother   . Hyperlipidemia Mother   . Stroke Father   . Hypertension Father     Social History   Tobacco Use  . Smoking status: Former Smoker    Packs/day: 0.50    Years: 16.00    Pack years: 8.00    Types: Cigarettes    Start date: 2004  . Smokeless tobacco: Never Used  . Tobacco comment: pt somokes some days  Substance Use Topics  . Alcohol use: Not Currently    Comment: Social    Subjective:  I connected with Dominique Webb on 06/13/20 at 11:00 AM EDT by a video enabled telemedicine application and verified that I am speaking with the correct person using two identifiers.   I discussed the limitations of evaluation and management by telemedicine and the availability of in person appointments. The patient expressed understanding and agreed to proceed. Provider in office/ patient is at home; provider and patient are only 2 people on video call.   Started with cold symptoms on Saturday; + nasal congestion/ cough; no fever, no shortness of breath or difficulty breathing; is [redacted] weeks pregnant-  has not taken any OTC medications; has not taken COVID test; requesting work note for today;    Objective:  Vitals:    General: Well developed, well nourished, in no acute distress  Skin : Warm and dry.  Head: Normocephalic and atraumatic  Lungs: Respirations unlabored;  Neurologic: Alert and oriented; speech intact; face symmetrical;   Assessment:  1. Viral URI   2. [redacted] weeks gestation of pregnancy     Plan:  Encouraged her to take home COVID test; she is to review "safe" lists of medications approved by her OB but did recommend OTC Claritin and Robitussin DM; increase fluids, rest; do not feel antibiotic warranted at this time but call back if no improvement by the end of the week.   No follow-ups on file.  No orders of the defined types were placed in this encounter.   Requested Prescriptions    No prescriptions requested or ordered in this encounter

## 2020-06-15 ENCOUNTER — Ambulatory Visit: Payer: BC Managed Care – PPO | Attending: Internal Medicine

## 2020-06-15 DIAGNOSIS — Z20822 Contact with and (suspected) exposure to covid-19: Secondary | ICD-10-CM

## 2020-06-16 LAB — SARS-COV-2, NAA 2 DAY TAT

## 2020-06-16 LAB — NOVEL CORONAVIRUS, NAA: SARS-CoV-2, NAA: NOT DETECTED

## 2020-06-29 ENCOUNTER — Ambulatory Visit (INDEPENDENT_AMBULATORY_CARE_PROVIDER_SITE_OTHER): Payer: BC Managed Care – PPO | Admitting: Student

## 2020-06-29 ENCOUNTER — Other Ambulatory Visit: Payer: Self-pay

## 2020-06-29 VITALS — BP 106/53 | HR 79 | Wt 144.3 lb

## 2020-06-29 DIAGNOSIS — Z3403 Encounter for supervision of normal first pregnancy, third trimester: Secondary | ICD-10-CM

## 2020-06-29 DIAGNOSIS — Z3401 Encounter for supervision of normal first pregnancy, first trimester: Secondary | ICD-10-CM

## 2020-06-29 DIAGNOSIS — O0990 Supervision of high risk pregnancy, unspecified, unspecified trimester: Secondary | ICD-10-CM

## 2020-06-29 DIAGNOSIS — Z3A28 28 weeks gestation of pregnancy: Secondary | ICD-10-CM

## 2020-06-29 DIAGNOSIS — Z23 Encounter for immunization: Secondary | ICD-10-CM | POA: Diagnosis not present

## 2020-06-29 NOTE — Progress Notes (Addendum)
   PRENATAL VISIT NOTE  Subjective:  Dominique Webb is a 35 y.o. G1P0 at [redacted]w[redacted]d being seen today for ongoing prenatal care.  She is currently monitored for the following issues for this low-risk pregnancy and has Tobacco abuse; Abnormal uterine bleeding (AUB); ASCUS of cervix with negative high risk HPV; Encounter for supervision of normal first pregnancy in first trimester; Supervision of high risk pregnancy, antepartum; and AMA (advanced maternal age) multigravida 35+ on their problem list.  Patient reports no complaints. Feeling faint, feeling dizzy in the heat at her job. She would like to be taken out of work. Her supervision told her to ask her provider.   Contractions: Not present. Vag. Bleeding: None.  Movement: Present. Denies leaking of fluid.   The following portions of the patient's history were reviewed and updated as appropriate: allergies, current medications, past family history, past medical history, past social history, past surgical history and problem list.   Objective:   Vitals:   06/29/20 1117  BP: (!) 106/53  Pulse: 79  Weight: 144 lb 4.8 oz (65.5 kg)    Fetal Status: Fetal Heart Rate (bpm): 142 Fundal Height: 28 cm Movement: Present     General:  Alert, oriented and cooperative. Patient is in no acute distress.  Skin: Skin is warm and dry. No rash noted.   Cardiovascular: Normal heart rate noted  Respiratory: Normal respiratory effort, no problems with respiration noted  Abdomen: Soft, gravid, appropriate for gestational age.  Pain/Pressure: Absent     Pelvic: Cervical exam deferred        Extremities: Normal range of motion.  Edema: Trace  Mental Status: Normal mood and affect. Normal behavior. Normal judgment and thought content.   Assessment and Plan:  Pregnancy: G1P0 at [redacted]w[redacted]d 1. 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 Explained that we do not currently have a reason to "take her out of  work" but that we can give her an Teacher, adult education. Discussed that patient should talk to her HR and see if she can take any short leave leave, and that taking her FMLA means that she will not have FMLA during her maternity leave.  - Tdap vaccine greater than or equal to 7yo IM -will do Glucola test (one hour today) as she missed 2 hour Preterm labor symptoms and general obstetric precautions including but not limited to vaginal bleeding, contractions, leaking of fluid and fetal movement were reviewed in detail with the patient. Please refer to After Visit Summary for other counseling recommendations.   Return in about 2 weeks (around 07/13/2020), or [facility].  Future Appointments  Date Time Provider Department Center  07/13/2020  1:35 PM Barbaraann Boys [facility]  07/17/2020 10:45 AM [facility] NURSE [facility]  07/17/2020 11:00 AM [facility] US1 [facility]    Marylene Land, CNM

## 2020-06-30 LAB — CBC
Hematocrit: 32.8 % — ABNORMAL LOW (ref 34.0–46.6)
Hemoglobin: 10.9 g/dL — ABNORMAL LOW (ref 11.1–15.9)
MCH: 32.5 pg (ref 26.6–33.0)
MCHC: 33.2 g/dL (ref 31.5–35.7)
MCV: 98 fL — ABNORMAL HIGH (ref 79–97)
Platelets: 195 10*3/uL (ref 150–450)
RBC: 3.35 x10E6/uL — ABNORMAL LOW (ref 3.77–5.28)
RDW: 13 % (ref 11.7–15.4)
WBC: 11.2 10*3/uL — ABNORMAL HIGH (ref 3.4–10.8)

## 2020-06-30 LAB — GLUCOSE TOLERANCE, 1 HOUR: Glucose, 1Hr PP: 115 mg/dL (ref 65–199)

## 2020-06-30 LAB — HIV ANTIBODY (ROUTINE TESTING W REFLEX): HIV Screen 4th Generation wRfx: NONREACTIVE

## 2020-06-30 LAB — RPR: RPR Ser Ql: NONREACTIVE

## 2020-07-02 ENCOUNTER — Other Ambulatory Visit: Payer: Self-pay | Admitting: Student

## 2020-07-02 DIAGNOSIS — O99013 Anemia complicating pregnancy, third trimester: Secondary | ICD-10-CM | POA: Insufficient documentation

## 2020-07-02 MED ORDER — FERROUS FUMARATE 325 (106 FE) MG PO TABS: 1.0000 | ORAL_TABLET | ORAL | 0 refills | Status: DC

## 2020-07-05 ENCOUNTER — Telehealth: Payer: Self-pay | Admitting: *Deleted

## 2020-07-05 ENCOUNTER — Other Ambulatory Visit: Payer: Self-pay

## 2020-07-05 ENCOUNTER — Ambulatory Visit (INDEPENDENT_AMBULATORY_CARE_PROVIDER_SITE_OTHER): Payer: BC Managed Care – PPO

## 2020-07-05 VITALS — BP 119/64 | HR 98 | Wt 144.7 lb

## 2020-07-05 DIAGNOSIS — R200 Anesthesia of skin: Secondary | ICD-10-CM

## 2020-07-05 DIAGNOSIS — Z3403 Encounter for supervision of normal first pregnancy, third trimester: Secondary | ICD-10-CM

## 2020-07-05 NOTE — Progress Notes (Signed)
Here today with complaint of right arm numbness that began this morning when she woke up. Reports numbness has lessened but is still present. Patient points to show numbness is from deltoid muscle to tips of fingers. States the tips of fingers were red earlier today. Patient received Tdap injection 06/29/20 and is concerned this may be related. Pt does have firm area at injection site that is felt on palpation, no redness or or other visual changes present.   [place], DO to bedside for assessment. Provider states it is unclear if this is direct result of vaccine or if this is carpel tunnel, which is common during pregnancy. Pt advised to purchase a brace for wrist to wear, especially during the night. Pt will follow up with our office with any worsening of symptoms.   Fleet Contras RN 07/05/20

## 2020-07-05 NOTE — Telephone Encounter (Signed)
Dominique Webb called office to speak to nurse. States she had tdap last 6/23./22 and has soreness, hurting and spoke to a nurse who advised either ice or warm compresses and tylenol as needed. States she did that and today she had numbness from shoulder to fingertips ( right arm) , states not completely  numb now can now feel some , states she can move arm , states skin color is normal. States it feels mostly warm to touch. States when she uses right hand to touch left hand she can feel warmth of left hand.  States she has knot size of kidney bean. I asked her to come in for nurse visit today at 3:45. She agreed to nurse visit. Fiorella Hanahan,RN

## 2020-07-07 ENCOUNTER — Encounter: Payer: Self-pay | Admitting: *Deleted

## 2020-07-07 ENCOUNTER — Encounter (INDEPENDENT_AMBULATORY_CARE_PROVIDER_SITE_OTHER): Payer: BC Managed Care – PPO | Admitting: *Deleted

## 2020-07-07 DIAGNOSIS — Z3403 Encounter for supervision of normal first pregnancy, third trimester: Secondary | ICD-10-CM | POA: Diagnosis not present

## 2020-07-07 DIAGNOSIS — O09529 Supervision of elderly multigravida, unspecified trimester: Secondary | ICD-10-CM

## 2020-07-13 ENCOUNTER — Other Ambulatory Visit: Payer: Self-pay

## 2020-07-13 ENCOUNTER — Ambulatory Visit (INDEPENDENT_AMBULATORY_CARE_PROVIDER_SITE_OTHER): Payer: BC Managed Care – PPO

## 2020-07-13 VITALS — BP 106/66 | HR 82 | Wt 144.0 lb

## 2020-07-13 DIAGNOSIS — O219 Vomiting of pregnancy, unspecified: Secondary | ICD-10-CM

## 2020-07-13 DIAGNOSIS — O99013 Anemia complicating pregnancy, third trimester: Secondary | ICD-10-CM

## 2020-07-13 DIAGNOSIS — Z3A30 30 weeks gestation of pregnancy: Secondary | ICD-10-CM

## 2020-07-13 DIAGNOSIS — Z3403 Encounter for supervision of normal first pregnancy, third trimester: Secondary | ICD-10-CM

## 2020-07-13 MED ORDER — ONDANSETRON 4 MG PO TBDP: 4.0000 mg | ORAL_TABLET | Freq: Four times a day (QID) | ORAL | 0 refills | Status: DC | PRN

## 2020-07-13 NOTE — Progress Notes (Signed)
LOW-RISK PREGNANCY OFFICE VISIT  Patient name: Dominique Webb MRN [id number]  Date of birth: [birth year] Chief Complaint:   Routine Home Visit  Subjective:   Dominique Webb is a 35 y.o. G1P0 female at [redacted]w[redacted]d with an Estimated Date of Delivery: 09/18/20 being seen today for ongoing management of a Low-risk pregnancy aeb has Tobacco abuse; Abnormal uterine bleeding (AUB); ASCUS of cervix with negative high risk HPV; Encounter for supervision of normal first pregnancy in third trimester; AMA (advanced maternal age) multigravida 35+; Anemia; and Anemia in pregnancy, third trimester on their problem list.  Patient presents today with nausea. She states her symptoms started this morning and she has had 3 incidents of vomiting today.  She reports she did not experience this in early pregnancy.  She did not take anything, but is open to medications.  She reports eating meatloaf and veggies last night.  She states she attempted to drink a ginger ale this morning, but felt it was not helping.  She states her last incident of emesis was around 1230. Patient endorses fetal movement. Patient denies abdominal cramping or contractions.  Patient denies vaginal concerns including abnormal discharge, leaking of fluid, and bleeding.  Contractions: Not present. Vag. Bleeding: None.  Movement: Present.  Reviewed past medical,surgical, social, obstetrical and family history as well as problem list, medications and allergies.  Objective   Vitals:   07/13/20 1400  BP: 106/66  Pulse: 82  Weight: 144 lb (65.3 kg)  Body mass index is 25.51 kg/m.  Total Weight Gain:28 lb (12.7 kg)         Physical Examination:   General appearance: Well appearing, and in no distress  Mental status: Alert, oriented to person, place, and time  Skin: Warm & dry  Cardiovascular: Normal heart rate noted  Respiratory: Normal respiratory effort, no distress  Abdomen: Soft, gravid, nontender, AGA with Fundal Height: 29 cm  Pelvic:  Cervical exam deferred           Extremities: Edema: Trace  Fetal Status: Fetal Heart Rate (bpm): 138  Movement: Present   No results found for this or any previous visit (from the past 24 hour(s)).  Assessment & Plan:  Low-risk pregnancy of a 35 y.o., G1P0 at [redacted]w[redacted]d with an Estimated Date of Delivery: 09/18/20   1. Encounter for supervision of normal first pregnancy in third trimester -Anticipatory guidance for upcoming appts. -Patient to schedule next appt in 2 weeks for an in-person visit. -Scheduled for Growth [place] and patient aware of date and reason for [place].   2. [redacted] weeks gestation of pregnancy -Addressed complaints.   3. Anemia in pregnancy, third trimester -Taking iron supplement.   4. Nausea and vomiting in pregnancy -Discussed nausea and reassured that normal occurrence in 3rd trimester. -Discussed usage of Zofran prn. Rx sent to pharmacy. -Given OOW excuse for today and tomorrow. -Encouraged rest and hydration. -Informed that if unable to keep foods/liquids digested or extensive vomiting go to MAU for evaluation.     Meds:  Meds ordered this encounter  Medications   ondansetron (ZOFRAN ODT) 4 MG disintegrating tablet    Sig: Take 1-2 tablets (4-8 mg total) by mouth every 6 (six) hours as needed for nausea or vomiting.    Dispense:  20 tablet    Refill:  0    Order Specific Question:   Supervising Provider    Answer:   Reva Bores [[id number]]   Labs/procedures today:  Lab Orders  No laboratory test(s) ordered today  Reviewed: Preterm labor symptoms and general obstetric precautions including but not limited to vaginal bleeding, contractions, leaking of fluid and fetal movement were reviewed in detail with the patient.  All questions were answered.  Follow-up: Return in about 2 weeks (around 07/27/2020) for LROB.  No orders of the defined types were placed in this encounter.  Cherre Robins MSN, CNM 07/13/2020

## 2020-07-17 ENCOUNTER — Ambulatory Visit: Payer: BC Managed Care – PPO | Admitting: *Deleted

## 2020-07-17 ENCOUNTER — Encounter: Payer: Self-pay | Admitting: *Deleted

## 2020-07-17 ENCOUNTER — Other Ambulatory Visit: Payer: Self-pay

## 2020-07-17 ENCOUNTER — Other Ambulatory Visit: Payer: Self-pay | Admitting: Obstetrics and Gynecology

## 2020-07-17 ENCOUNTER — Ambulatory Visit: Payer: BC Managed Care – PPO | Attending: Obstetrics and Gynecology

## 2020-07-17 ENCOUNTER — Other Ambulatory Visit: Payer: Self-pay | Admitting: *Deleted

## 2020-07-17 VITALS — BP 112/51 | HR 84

## 2020-07-17 DIAGNOSIS — Z363 Encounter for antenatal screening for malformations: Secondary | ICD-10-CM | POA: Diagnosis not present

## 2020-07-17 DIAGNOSIS — Z3403 Encounter for supervision of normal first pregnancy, third trimester: Secondary | ICD-10-CM | POA: Diagnosis present

## 2020-07-17 DIAGNOSIS — O09523 Supervision of elderly multigravida, third trimester: Secondary | ICD-10-CM

## 2020-07-17 DIAGNOSIS — Z3A31 31 weeks gestation of pregnancy: Secondary | ICD-10-CM

## 2020-07-17 DIAGNOSIS — O26893 Other specified pregnancy related conditions, third trimester: Secondary | ICD-10-CM

## 2020-07-17 DIAGNOSIS — O365930 Maternal care for other known or suspected poor fetal growth, third trimester, not applicable or unspecified: Secondary | ICD-10-CM | POA: Diagnosis not present

## 2020-07-17 DIAGNOSIS — O09513 Supervision of elderly primigravida, third trimester: Secondary | ICD-10-CM

## 2020-07-17 DIAGNOSIS — R109 Unspecified abdominal pain: Secondary | ICD-10-CM

## 2020-07-25 ENCOUNTER — Other Ambulatory Visit: Payer: Self-pay

## 2020-07-25 ENCOUNTER — Ambulatory Visit: Payer: BC Managed Care – PPO | Admitting: *Deleted

## 2020-07-25 ENCOUNTER — Encounter: Payer: Self-pay | Admitting: *Deleted

## 2020-07-25 ENCOUNTER — Encounter (HOSPITAL_COMMUNITY): Payer: Self-pay | Admitting: Obstetrics & Gynecology

## 2020-07-25 ENCOUNTER — Ambulatory Visit (HOSPITAL_BASED_OUTPATIENT_CLINIC_OR_DEPARTMENT_OTHER): Payer: BC Managed Care – PPO

## 2020-07-25 ENCOUNTER — Observation Stay (HOSPITAL_COMMUNITY)
Admission: RE | Admit: 2020-07-25 | Discharge: 2020-07-26 | Disposition: A | Discharge status: Home / Self Care | Payer: BC Managed Care – PPO | Attending: Obstetrics and Gynecology | Admitting: Obstetrics and Gynecology

## 2020-07-25 VITALS — BP 104/47 | HR 64

## 2020-07-25 DIAGNOSIS — O26893 Other specified pregnancy related conditions, third trimester: Secondary | ICD-10-CM | POA: Diagnosis present

## 2020-07-25 DIAGNOSIS — Z87891 Personal history of nicotine dependence: Secondary | ICD-10-CM | POA: Diagnosis not present

## 2020-07-25 DIAGNOSIS — R55 Syncope and collapse: Secondary | ICD-10-CM | POA: Diagnosis not present

## 2020-07-25 DIAGNOSIS — Z3403 Encounter for supervision of normal first pregnancy, third trimester: Secondary | ICD-10-CM | POA: Insufficient documentation

## 2020-07-25 DIAGNOSIS — R109 Unspecified abdominal pain: Secondary | ICD-10-CM | POA: Insufficient documentation

## 2020-07-25 DIAGNOSIS — Z20822 Contact with and (suspected) exposure to covid-19: Secondary | ICD-10-CM | POA: Insufficient documentation

## 2020-07-25 DIAGNOSIS — O365930 Maternal care for other known or suspected poor fetal growth, third trimester, not applicable or unspecified: Secondary | ICD-10-CM | POA: Insufficient documentation

## 2020-07-25 DIAGNOSIS — Z363 Encounter for antenatal screening for malformations: Secondary | ICD-10-CM

## 2020-07-25 DIAGNOSIS — Z3A32 32 weeks gestation of pregnancy: Secondary | ICD-10-CM

## 2020-07-25 DIAGNOSIS — O09523 Supervision of elderly multigravida, third trimester: Secondary | ICD-10-CM

## 2020-07-25 LAB — HEMOGLOBIN AND HEMATOCRIT, BLOOD
HCT: 33.1 % — ABNORMAL LOW (ref 36.0–46.0)
Hemoglobin: 10.9 g/dL — ABNORMAL LOW (ref 12.0–15.0)

## 2020-07-25 LAB — URINALYSIS, ROUTINE W REFLEX MICROSCOPIC
Bilirubin Urine: NEGATIVE
Glucose, UA: NEGATIVE mg/dL
Hgb urine dipstick: NEGATIVE
Ketones, ur: 80 mg/dL — AB
Leukocytes,Ua: NEGATIVE
Nitrite: NEGATIVE
Protein, ur: NEGATIVE mg/dL
Specific Gravity, Urine: 1.01 (ref 1.005–1.030)
pH: 8 (ref 5.0–8.0)

## 2020-07-25 LAB — BASIC METABOLIC PANEL
Anion gap: 10 (ref 5–15)
BUN: <5 mg/dL — ABNORMAL LOW (ref 6–20)
CO2: 23 mmol/L (ref 22–32)
Calcium: 9.5 mg/dL (ref 8.9–10.3)
Chloride: 103 mmol/L (ref 98–111)
Creatinine, Ser: 0.57 mg/dL (ref 0.44–1.00)
GFR, Estimated: >60 mL/min (ref >60)
Glucose, Bld: 79 mg/dL (ref 70–99)
Potassium: 3.8 mmol/L (ref 3.5–5.1)
Sodium: 136 mmol/L (ref 135–145)

## 2020-07-25 LAB — RESP PANEL BY RT-PCR (FLU A&B, COVID) ARPGX2
Influenza A by PCR: NEGATIVE
Influenza B by PCR: NEGATIVE
SARS Coronavirus 2 by RT PCR: NEGATIVE

## 2020-07-25 LAB — TYPE AND SCREEN
ABO/RH(D): A POS
Antibody Screen: NEGATIVE

## 2020-07-25 LAB — WET PREP, GENITAL
Clue Cells Wet Prep HPF POC: NONE SEEN
Sperm: NONE SEEN
Trich, Wet Prep: NONE SEEN
Yeast Wet Prep HPF POC: NONE SEEN

## 2020-07-25 LAB — GLUCOSE, CAPILLARY: Glucose-Capillary: 65 mg/dL — ABNORMAL LOW (ref 70–99)

## 2020-07-25 MED ORDER — ACETAMINOPHEN 325 MG PO TABS: 650.0000 mg | ORAL_TABLET | ORAL | Status: DC | PRN

## 2020-07-25 MED ORDER — PRENATAL MULTIVITAMIN CH: 1.0000 | ORAL_TABLET | Freq: Every day | ORAL | Status: DC

## 2020-07-25 MED ORDER — LACTATED RINGERS IV BOLUS
1000.0000 mL | Freq: Once | INTRAVENOUS | Status: AC
  Administered 2020-07-25: 1000 mL via INTRAVENOUS

## 2020-07-25 MED ORDER — CALCIUM CARBONATE ANTACID 500 MG PO CHEW: 2.0000 | CHEWABLE_TABLET | ORAL | Status: DC | PRN

## 2020-07-25 MED ORDER — DOCUSATE SODIUM 100 MG PO CAPS
100.0000 mg | ORAL_CAPSULE | Freq: Every day | ORAL | Status: DC
  Administered 2020-07-25: 100 mg via ORAL
  Filled 2020-07-25: qty 1

## 2020-07-25 MED ORDER — LACTATED RINGERS IV SOLN: INTRAVENOUS | Status: DC

## 2020-07-25 MED ORDER — NIFEDIPINE 10 MG PO CAPS
10.0000 mg | ORAL_CAPSULE | ORAL | Status: AC
  Administered 2020-07-25 (×2): 10 mg via ORAL
  Filled 2020-07-25 (×3): qty 1

## 2020-07-25 NOTE — MAU Note (Signed)
Pt reports that she was feeling hot at work. 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 Pt reports she went to the bathroom because she felt like she needed to throw up. Pt states that she does not know if she passed out or not, but was on the floor in the bathroom.   Pt reports nausea that is not helped by her prescription zofran.   Denies vaginal bleeding or LOF.   Reports +FM

## 2020-07-25 NOTE — H&P (Signed)
OBSTETRIC ADMISSION HISTORY AND PHYSICAL  Dominique Webb is a 35 y.o. G1P0 at [redacted]w[redacted]d who presents to maternity admissions reporting syncopal episode, nausea, vomiting and abdominal pain. 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 Had a hotdog. She then clocked in around 1:15pm. Around 2-3 pm she felt really hot so she went to sit in the conference room where there is [facility] as there is none on the warehouse floor. She then felt like she was going to throw up so she ran to the bathroom and vomited 3 times. She then reports that she "just woke up on the floor". When asked further, patient says that she was sitting against the wall and thinks she was able to slide Webb to a sitting position. She is unsure if she passed out. She does not remember what happened, but does not think she hit her abdomen or head. This was an unwitnessed event. She reports some contractions and abdominal pain. Denies leaking fluid, vaginal bleeding, HA, vision changes, CP, or SOB.   Dating: By 12 week ultrasound --->  Estimated Date of Delivery: 09/18/20  Sono:    @[redacted]w[redacted]d , CWD, normal anatomy, cephalic presentation, anterior placenta, 321g, 33% EFW   Prenatal History/Complications:  - Fetal growth restriction - AMA  Past Medical History: History reviewed. No pertinent past medical history.  Past Surgical History: History reviewed. No pertinent surgical history.  Obstetrical History: OB History     Gravida  1   Para      Term      Preterm      AB      Living         SAB      IAB      Ectopic      Multiple      Live Births              Social History Social History   Socioeconomic History   Marital status: Single    Spouse name: Not on file   Number of children: Not on file   Years of education: Not on file   Highest education level: Not on file  Occupational History   Not on file  Tobacco Use   Smoking status: Former    Packs/day: 0.50    Years: 16.00     Pack years: 8.00    Types: Cigarettes    Start date: 2004   Smokeless tobacco: Never   Tobacco comments:    pt somokes some days  Vaping Use   Vaping Use: Never used  Substance and Sexual Activity   Alcohol use: Not Currently    Comment: Social   Drug use: No   Sexual activity: Yes    Birth control/protection: Injection, None  Other Topics Concern   Not on file  Social History Narrative   Not on file   Social Determinants of Health   Financial Resource Strain: Not on file  Food Insecurity: No Food Insecurity   Worried About Running Out of Food in the Last Year: Never true   Ran Out of Food in the Last Year: Never true  Transportation Needs: No Transportation Needs   Lack of Transportation (Medical): No   Lack of Transportation (Non-Medical): No  Physical Activity: Not on file  Stress: Not on file  Social Connections: Not on file    Family History: Family History  Problem Relation Age of Onset   Diabetes Mother    Hypertension Mother  Hyperlipidemia Mother    Stroke Father    Hypertension Father     Allergies: Allergies  Allergen Reactions   Aspirin Other (See Comments)    "stomach bleeds" Can take Advil    Medications Prior to Admission  Medication Sig Dispense Refill Last Dose   ferrous fumarate (HEMOCYTE - 106 MG FE) 325 (106 Fe) MG TABS tablet Take 1 tablet (106 mg of iron total) by mouth every other day. 30 tablet 0 07/25/2020   ondansetron (ZOFRAN ODT) 4 MG disintegrating tablet Take 1-2 tablets (4-8 mg total) by mouth every 6 (six) hours as needed for nausea or vomiting. 20 tablet 0 Past Month   Prenatal Vit-Fe Fumarate-FA (PRENATAL VITAMINS) 28-0.8 MG TABS Take 1 tablet by mouth daily. 30 tablet 12 07/25/2020   Review of Systems   All systems reviewed and negative except as stated in HPI  Blood pressure (!) 112/55, pulse 66, temperature 98.2 F (36.8 C), resp. rate 20, last menstrual period 12/17/2019, SpO2 100 %. General appearance: alert,  cooperative, and no distress Lungs: effort normal Heart: regular rate Abdomen: soft, non-tender; bowel sounds normal Extremities: No sign of DVT Presentation: cephalic Fetal monitoring: 140s bpm, moderate variability, +15x15 accels, 1 prolonged decel Uterine activity: irregular Dilation: Closed Exam by:: Camelia Eng, CNM  Prenatal labs: ABO, Rh: A/Positive/-- (02/23 1344) Antibody: Negative (02/23 1344) Rubella: 1.68 (02/23 1344) RPR: Non Reactive (06/23 1225)  HBsAg: Negative (02/23 1344)  HIV: Non Reactive (06/23 1225)  GBS: unknown  1 hr Glucola: 115 Genetic screening: LR NIPS Anatomy US: normal anatomy  Prenatal Transfer Tool  Maternal Diabetes: No Genetic Screening: Normal Maternal Ultrasounds/Referrals: IUGR Fetal Ultrasounds or other Referrals:  None Maternal Substance Abuse:  No Significant Maternal Medications:  None Significant Maternal Lab Results: None  Results for orders placed or performed during the hospital encounter of 07/25/20 (from the past 24 hour(s))  Glucose, capillary   Collection Time: 07/25/20  4:36 PM  Result Value Ref Range   Glucose-Capillary 65 (L) 70 - 99 mg/dL  Wet prep, genital   Collection Time: 07/25/20  4:56 PM  Result Value Ref Range   Yeast Wet Prep HPF POC NONE SEEN NONE SEEN   Trich, Wet Prep NONE SEEN NONE SEEN   Clue Cells Wet Prep HPF POC NONE SEEN NONE SEEN   WBC, Wet Prep HPF POC FEW (A) NONE SEEN   Sperm NONE SEEN   Basic metabolic panel   Collection Time: 07/25/20  5:34 PM  Result Value Ref Range   Sodium 136 135 - 145 mmol/L   Potassium 3.8 3.5 - 5.1 mmol/L   Chloride 103 98 - 111 mmol/L   CO2 23 22 - 32 mmol/L   Glucose, Bld 79 70 - 99 mg/dL   BUN <5 (L) 6 - 20 mg/dL   Creatinine, Ser 3.29 0.44 - 1.00 mg/dL   Calcium 9.5 8.9 - 51.8 mg/dL   GFR, Estimated >84 >16 mL/min   Anion gap 10 5 - 15  Hemoglobin and hematocrit, blood   Collection Time: 07/25/20  5:34 PM  Result Value Ref Range   Hemoglobin 10.9  (L) 12.0 - 15.0 g/dL   HCT 60.6 (L) 30.1 - 60.1 %    Patient Active Problem List   Diagnosis Date Noted   Syncope 07/25/2020   Anemia 07/02/2020   Anemia in pregnancy, third trimester 07/02/2020   Encounter for supervision of normal first pregnancy in third trimester 04/04/2020   AMA (advanced maternal age) multigravida 35+ 04/04/2020  ASCUS of cervix with negative high risk HPV 09/26/2016   Tobacco abuse 09/19/2016   Abnormal uterine bleeding (AUB) 09/19/2016    Assessment/Plan:  Dominique Webb is a 35 y.o. G1P0 at [redacted]w[redacted]d here for syncopal episode, nausea, and vomiting.  Patient having some contractions, which spaced after 1 dose of procardia and IVF bolus. Pain improved, but continues to not feel well. EKG reviewed by cardiology, Dr. Jacques Navy, who recommends admission and maternal echo in the morning given patient presentation and some changes to EKG from previous EKG.  Dr. Jolayne Panther was notified of recommendations and will place admission orders to [facility].     Brand Males, CNM  07/25/2020, 7:07 PM

## 2020-07-26 ENCOUNTER — Observation Stay (HOSPITAL_BASED_OUTPATIENT_CLINIC_OR_DEPARTMENT_OTHER): Payer: BC Managed Care – PPO

## 2020-07-26 DIAGNOSIS — O2693 Pregnancy related conditions, unspecified, third trimester: Secondary | ICD-10-CM

## 2020-07-26 DIAGNOSIS — O26893 Other specified pregnancy related conditions, third trimester: Secondary | ICD-10-CM | POA: Diagnosis not present

## 2020-07-26 DIAGNOSIS — Z3A32 32 weeks gestation of pregnancy: Secondary | ICD-10-CM | POA: Diagnosis not present

## 2020-07-26 DIAGNOSIS — R55 Syncope and collapse: Secondary | ICD-10-CM | POA: Diagnosis not present

## 2020-07-26 LAB — GC/CHLAMYDIA PROBE AMP (~~LOC~~) NOT AT ARMC
Chlamydia: NEGATIVE
Comment: NEGATIVE
Comment: NORMAL
Neisseria Gonorrhea: NEGATIVE

## 2020-07-26 LAB — ECHOCARDIOGRAM COMPLETE
Area-P 1/2: 2.77 cm2
Height: 63 in
S' Lateral: 2.9 cm
Weight: 2336.4 oz

## 2020-07-26 NOTE — Progress Notes (Signed)
  Echocardiogram 2D Echocardiogram has been performed.  Dominique Webb 07/26/2020, 9:02 AM

## 2020-07-26 NOTE — Progress Notes (Signed)
Patient ID: Dominique Webb, female   DOB: 11-21-1985, 35 y.o.   MRN: [id number] FACULTY PRACTICE ANTEPARTUM(COMPREHENSIVE) NOTE  Dominique Webb is a 35 y.o. G1P0 at [redacted]w[redacted]d  who is admitted for cardiac evaluation following a syncope episode.    Fetal presentation is cephalic. Length of Stay:  0  Days  Date of admission:07/25/2020  Subjective: Patient is doing well this morning and is without complaints. She has ambulated since admission and denies feeling dizzy or lightheaded. She denies chest pain or shortness of breath. Patient denies nausea or emesis and has tolerated a regular diet.  Patient reports the fetal movement as active. Patient reports uterine contraction  activity as none. Patient reports  vaginal bleeding as none. Patient describes fluid per vagina as None.  Vitals:  Blood pressure (!) 94/45, pulse 64, temperature 98.2 F (36.8 C), temperature source Oral, resp. rate 16, height  (1.6 m), weight 66.2 kg, last menstrual period 12/17/2019, SpO2 100 %. Vitals:   07/25/20 1952 07/25/20 2029 07/25/20 2352 07/26/20 0337  BP: (!) 107/51  (!) 104/48 (!) 94/45  Pulse: 75  67 64  Resp: Temp: 98.5 F (36.9 C)  98.2 F (36.8 C) 98.2 F (36.8 C)  TempSrc: Oral  Oral Oral  SpO2: 100%  100% 100%  Weight:  66.2 kg    Height:  (1.6 m)      Physical Examination: GENERAL: Well-developed, well-nourished female in no acute distress.  LUNGS: Clear to auscultation bilaterally.  HEART: Regular rate and rhythm. ABDOMEN: Soft, nontender, gravid PELVIC: Not indicated EXTREMITIES: No cyanosis, clubbing, or edema, 2+ distal pulses.   Fetal Monitoring:  Baseline: 140 bpm, Variability: Good {> 6 bpm), Accelerations: Reactive, and Decelerations: Absent   reactive  Labs:  Results for orders placed or performed during the hospital encounter of 07/25/20 (from the past 24 hour(s))  Glucose, capillary   Collection Time: 07/25/20  4:36 PM  Result Value Ref Range    Glucose-Capillary 65 (L) 70 - 99 mg/dL  Wet prep, genital   Collection Time: 07/25/20  4:56 PM  Result Value Ref Range   Yeast Wet Prep HPF POC NONE SEEN NONE SEEN   Trich, Wet Prep NONE SEEN NONE SEEN   Clue Cells Wet Prep HPF POC NONE SEEN NONE SEEN   WBC, Wet Prep HPF POC FEW (A) NONE SEEN   Sperm NONE SEEN   Basic metabolic panel   Collection Time: 07/25/20  5:34 PM  Result Value Ref Range   Sodium 136 135 - 145 mmol/L   Potassium 3.8 3.5 - 5.1 mmol/L   Chloride 103 98 - 111 mmol/L   CO2 23 22 - 32 mmol/L   Glucose, Bld 79 70 - 99 mg/dL   BUN <5 (L) 6 - 20 mg/dL   Creatinine, Ser 4.09 0.44 - 1.00 mg/dL   Calcium 9.5 8.9 - 81.1 mg/dL   GFR, Estimated >91 >47 mL/min   Anion gap 10 5 - 15  Hemoglobin and hematocrit, blood   Collection Time: 07/25/20  5:34 PM  Result Value Ref Range   Hemoglobin 10.9 (L) 12.0 - 15.0 g/dL   HCT 82.9 (L) 56.2 - 13.0 %  Resp Panel by RT-PCR (Flu A&B, Covid) Nasopharyngeal Swab   Collection Time: 07/25/20  7:17 PM   Specimen: Nasopharyngeal Swab; Nasopharyngeal(NP) swabs in vial transport medium  Result Value Ref Range   SARS Coronavirus 2 by RT PCR NEGATIVE NEGATIVE   Influenza A by PCR  NEGATIVE NEGATIVE   Influenza B by PCR NEGATIVE NEGATIVE  Urinalysis, Routine w reflex microscopic Urine, Clean Catch   Collection Time: 07/25/20  7:29 PM  Result Value Ref Range   Color, Urine YELLOW YELLOW   APPearance HAZY (A) CLEAR   Specific Gravity, Urine 1.010 1.005 - 1.030   pH 8.0 5.0 - 8.0   Glucose, UA NEGATIVE NEGATIVE mg/dL   Hgb urine dipstick NEGATIVE NEGATIVE   Bilirubin Urine NEGATIVE NEGATIVE   Ketones, ur 80 (A) NEGATIVE mg/dL   Protein, ur NEGATIVE NEGATIVE mg/dL   Nitrite NEGATIVE NEGATIVE   Leukocytes,Ua NEGATIVE NEGATIVE  Type and screen [facility]   Collection Time: 07/25/20  7:34 PM  Result Value Ref Range   ABO/RH(D) A POS    Antibody Screen NEG    Sample Expiration      07/28/2020,2359 Performed at [facility], [address] [place], [place] [address]     Imaging Studies:    [place] MFM FETAL BPP WO NON STRESS  Result Date: 07/25/2020 ----------------------------------------------------------------------  OBSTETRICS REPORT                       (Signed Final 07/25/2020 09:41 am) ---------------------------------------------------------------------- Patient Info  ID #:       [id number]                          D.O.B.:  1985/01/16 (35 yrs)  Name:       Dominique Webb                Visit Date: 07/25/2020 07:22 am ---------------------------------------------------------------------- Performed By  Attending:        Noralee Space MD        Ref. Address:     [address]                                                             [place],                                                             [place] [address]  Performed By:     Dominique Webb            [facility]  Referred By:      Dominique Webb CNM ---------------------------------------------------------------------- Orders  #  Description                           Code        Ordered By  1  [place] MFM UA CORD DOPPLER                76820.02    Dominique Webb  2  [place] MFM FETAL BPP WO NON               76819.01    Dominique Webb     STRESS ----------------------------------------------------------------------  #  Order #                     Accession #                Episode #  1  [id number]                   [id number]                 [id number]  2  [id number]                   [id number]                 [id number] ---------------------------------------------------------------------- Indications  Maternal care for known or suspected poor      O36.5931  fetal growth, third trimester, fetus 1 IUGR  Advanced maternal age  multigravida 37+,        O75.522  second trimester  Encounter for antenatal screening for          Z36.3  malformations  Low Risk NIPS  Neg Horizon  Abdominal pain in pregnancy                    O99.89  [redacted] weeks gestation of pregnancy                Z3A.32 ---------------------------------------------------------------------- Fetal Evaluation  Num Of Fetuses:         1  Fetal Heart Rate(bpm):  130  Cardiac Activity:       Observed  Presentation:           Cephalic  Placenta:               Anterior  P. Cord Insertion:      Visualized  Amniotic Fluid  AFI FV:      Within normal limits  AFI Sum(cm)     %Tile       Largest Pocket(cm)  13.12           40          3.75  RUQ(cm)       RLQ(cm)       LUQ(cm)        LLQ(cm)  3             3.75          3.58           2.79 ---------------------------------------------------------------------- Biophysical Evaluation  Amniotic F.V:   Within normal limits       F. Tone:        Observed  F. Movement:    Observed  Score:          8/8  F. Breathing:   Observed ---------------------------------------------------------------------- OB History  Gravidity:    1         Term:   0  Living:       0 ---------------------------------------------------------------------- Gestational Age  LMP:           31w 4d        Date:  12/17/19                 EDD:   09/22/20  Best:          32w 1d     Det. By:  Previous Ultrasound      EDD:   09/18/20                                      (03/09/20) ---------------------------------------------------------------------- Anatomy  Cranium:               Previously seen        LVOT:                   Previously seen  Cavum:                 Previously seen        Aortic Arch:            Previously seen  Ventricles:            Previously seen        Ductal Arch:            Previously seen  Choroid Plexus:        Previously seen        Diaphragm:              Appears normal  Cerebellum:            Previously seen        Stomach:                 Appears normal, left                                                                        sided  Posterior Fossa:       Previously seen        Abdomen:                Appears normal  Nuchal Fold:           Not applicable (>20    Abdominal Wall:         Previously seen                         wks GA)  Face:                  Orbits and profile     Cord Vessels:           Previously seen                         previously seen  Lips:  Previously seen        Kidneys:                Appear normal  Palate:                Previously seen        Bladder:                Appears normal  Thoracic:              Previously seen        Spine:                  Previously seen  Heart:                 Appears normal         Upper Extremities:      Previously seen                         (4CH, axis, and                         situs)  RVOT:                  Previously seen        Lower Extremities:      Previously seen  Other:  Fetus appears to be female. Nasal bone prev visualized. Technically          difficult due to fetal position. ---------------------------------------------------------------------- Doppler - Fetal Vessels  Umbilical Artery   S/D     %tile      RI    %tile      PI    %tile            ADFV    RDFV   3.35       83     0.7       84    1.16       90               No      No ---------------------------------------------------------------------- Impression  Severe fetal growth restriction.  On ultrasound performed last  week, the estimated fetal weight was at the 1st percentile.  Blood pressure today at her office 104/47 mmHg.  She does  not have gestational diabetes.  Amniotic fluid is normal and good fetal activity seen.  Umbilical artery Doppler showed normal forward diastolic  flow.Antenatal testing is reassuring. BPP 8/8. ---------------------------------------------------------------------- Recommendations  Continue weekly BPP and UA Doppler till delivery.  ----------------------------------------------------------------------                  Dominique Space, MD Electronically Signed Final Report   07/25/2020 09:41 am ----------------------------------------------------------------------  [place] MFM UA CORD DOPPLER  Result Date: 07/25/2020 ----------------------------------------------------------------------  OBSTETRICS REPORT                       (Signed Final 07/25/2020 09:41 am) ---------------------------------------------------------------------- Patient Info  ID #:       [id number]                          D.O.B.:  10-01-1985 (35 yrs)  Name:       Dominique Webb                Visit Date: 07/25/2020 07:22 am ---------------------------------------------------------------------- Performed By  Attending:        Noralee Space MD        Ref. Address:     [address]                                                             [place],                                                             [place] [address]  Performed By:     Dominique Webb            [facility]  Referred By:      Dominique Webb CNM ---------------------------------------------------------------------- Orders  #  Description                           Code        Ordered By  1  [place] MFM UA CORD DOPPLER                76820.02    Dominique Webb  2  [place] MFM FETAL BPP WO NON               76819.01    Dominique Webb     STRESS ----------------------------------------------------------------------  #  Order #                     Accession #                Episode #  1  [id number]                   [id number]                 [id number]  2  [id number]  [id number]                 [id number]  ---------------------------------------------------------------------- Indications  Maternal care for known or suspected poor      O36.5931  fetal growth, third trimester, fetus 1 IUGR  Advanced maternal age multigravida 20+,        O50.522  second trimester  Encounter for antenatal screening for          Z36.3  malformations  Low Risk NIPS  Neg Horizon  Abdominal pain in pregnancy                    O99.89  [redacted] weeks gestation of pregnancy                Z3A.32 ---------------------------------------------------------------------- Fetal Evaluation  Num Of Fetuses:         1  Fetal Heart Rate(bpm):  130  Cardiac Activity:       Observed  Presentation:           Cephalic  Placenta:               Anterior  P. Cord Insertion:      Visualized  Amniotic Fluid  AFI FV:      Within normal limits  AFI Sum(cm)     %Tile       Largest Pocket(cm)  13.12           40          3.75  RUQ(cm)       RLQ(cm)       LUQ(cm)        LLQ(cm)  3             3.75          3.58           2.79 ---------------------------------------------------------------------- Biophysical Evaluation  Amniotic F.V:   Within normal limits       F. Tone:        Observed  F. Movement:    Observed                   Score:          8/8  F. Breathing:   Observed ---------------------------------------------------------------------- OB History  Gravidity:    1         Term:   0  Living:       0 ---------------------------------------------------------------------- Gestational Age  LMP:           31w 4d        Date:  12/17/19                 EDD:   09/22/20  Best:          32w 1d     Det. By:  Previous Ultrasound      EDD:   09/18/20                                      (03/09/20) ---------------------------------------------------------------------- Anatomy  Cranium:               Previously seen        LVOT:                   Previously seen  Cavum:                 Previously seen  Aortic Arch:            Previously seen  Ventricles:            Previously  seen        Ductal Arch:            Previously seen  Choroid Plexus:        Previously seen        Diaphragm:              Appears normal  Cerebellum:            Previously seen        Stomach:                Appears normal, left                                                                        sided  Posterior Fossa:       Previously seen        Abdomen:                Appears normal  Nuchal Fold:           Not applicable (>20    Abdominal Wall:         Previously seen                         wks GA)  Face:                  Orbits and profile     Cord Vessels:           Previously seen                         previously seen  Lips:                  Previously seen        Kidneys:                Appear normal  Palate:                Previously seen        Bladder:                Appears normal  Thoracic:              Previously seen        Spine:                  Previously seen  Heart:                 Appears normal         Upper Extremities:      Previously seen                         (4CH, axis, and                         situs)  RVOT:  Previously seen        Lower Extremities:      Previously seen  Other:  Fetus appears to be female. Nasal bone prev visualized. Technically          difficult due to fetal position. ---------------------------------------------------------------------- Doppler - Fetal Vessels  Umbilical Artery   S/D     %tile      RI    %tile      PI    %tile            ADFV    RDFV   3.35       83     0.7       84    1.16       90               No      No ---------------------------------------------------------------------- Impression  Severe fetal growth restriction.  On ultrasound performed last  week, the estimated fetal weight was at the 1st percentile.  Blood pressure today at her office 104/47 mmHg.  She does  not have gestational diabetes.  Amniotic fluid is normal and good fetal activity seen.  Umbilical artery Doppler showed normal forward diastolic   flow.Antenatal testing is reassuring. BPP 8/8. ---------------------------------------------------------------------- Recommendations  Continue weekly BPP and UA Doppler till delivery. ----------------------------------------------------------------------                  Dominique Space, MD Electronically Signed Final Report   07/25/2020 09:41 am ----------------------------------------------------------------------    Medications:  Scheduled  docusate sodium  100 mg Oral Daily   prenatal multivitamin  1 tablet Oral Q1200   I have reviewed the patient's current medications.  ASSESSMENT: G1P0 [redacted]w[redacted]d Estimated Date of Delivery: 09/18/20  Patient Active Problem List   Diagnosis Date Noted   Syncope 07/25/2020   Anemia 07/02/2020   Anemia in pregnancy, third trimester 07/02/2020   Encounter for supervision of normal first pregnancy in third trimester 04/04/2020   AMA (advanced maternal age) multigravida 35+ 04/04/2020   ASCUS of cervix with negative high risk HPV 09/26/2016   Tobacco abuse 09/19/2016   Abnormal uterine bleeding (AUB) 09/19/2016    PLAN: Maternal fetal status currently stable Awaiting maternal echo Discharge planning pending echo results Continue current antepartum care  Dominique Webb 07/26/2020,7:48 AM

## 2020-07-26 NOTE — Discharge Summary (Signed)
Physician Discharge Summary  Patient ID: Dominique Webb MRN: [id number] DOB/AGE: 1985/07/05 35 y.o.  Admit date: 07/25/2020 Discharge date: 07/26/2020  Admission Diagnoses: Syncope  Discharge Diagnoses:  Active Problems:   Syncope   Discharged Condition: stable  Hospital Course: 35yo G1P0@ [redacted]w[redacted]d admitted due to syncopal event at work.  She was treated with IV hydration.  Work up including EKG showed elevated T waves, ECHO completed that showed no abnormal findings.  Plan to monitor as outpatient.  Consults: cardiology  Significant Diagnostic Studies: labs:  Results for orders placed or performed during the hospital encounter of 07/25/20 (from the past 24 hour(s))  Glucose, capillary     Status: Abnormal   Collection Time: 07/25/20  4:36 PM  Result Value Ref Range   Glucose-Capillary 65 (L) 70 - 99 mg/dL  Wet prep, genital     Status: Abnormal   Collection Time: 07/25/20  4:56 PM  Result Value Ref Range   Yeast Wet Prep HPF POC NONE SEEN NONE SEEN   Trich, Wet Prep NONE SEEN NONE SEEN   Clue Cells Wet Prep HPF POC NONE SEEN NONE SEEN   WBC, Wet Prep HPF POC FEW (A) NONE SEEN   Sperm NONE SEEN   GC/Chlamydia probe amp ([place])not at [facility]     Status: None   Collection Time: 07/25/20  5:00 PM  Result Value Ref Range   Neisseria Gonorrhea Negative    Chlamydia Negative    Comment Normal Reference [place] Chlamydia - Negative    Comment      Normal Reference Range Neisseria Gonorrhea - Negative  Basic metabolic panel     Status: Abnormal   Collection Time: 07/25/20  5:34 PM  Result Value Ref Range   Sodium 136 135 - 145 mmol/L   Potassium 3.8 3.5 - 5.1 mmol/L   Chloride 103 98 - 111 mmol/L   CO2 23 22 - 32 mmol/L   Glucose, Bld 79 70 - 99 mg/dL   BUN <5 (L) 6 - 20 mg/dL   Creatinine, Ser 5.03 0.44 - 1.00 mg/dL   Calcium 9.5 8.9 - 54.6 mg/dL   GFR, Estimated >56 >81 mL/min   Anion gap 10 5 - 15  Hemoglobin and hematocrit, blood     Status: Abnormal   Collection  Time: 07/25/20  5:34 PM  Result Value Ref Range   Hemoglobin 10.9 (L) 12.0 - 15.0 g/dL   HCT 27.5 (L) 17.0 - 01.7 %  Resp Panel by RT-PCR (Flu A&B, Covid) Nasopharyngeal Swab     Status: None   Collection Time: 07/25/20  7:17 PM   Specimen: Nasopharyngeal Swab; Nasopharyngeal(NP) swabs in vial transport medium  Result Value Ref Range   SARS Coronavirus 2 by RT PCR NEGATIVE NEGATIVE   Influenza A by PCR NEGATIVE NEGATIVE   Influenza B by PCR NEGATIVE NEGATIVE  Urinalysis, Routine w reflex microscopic Urine, Clean Catch     Status: Abnormal   Collection Time: 07/25/20  7:29 PM  Result Value Ref Range   Color, Urine YELLOW YELLOW   APPearance HAZY (A) CLEAR   Specific Gravity, Urine 1.010 1.005 - 1.030   pH 8.0 5.0 - 8.0   Glucose, UA NEGATIVE NEGATIVE mg/dL   Hgb urine dipstick NEGATIVE NEGATIVE   Bilirubin Urine NEGATIVE NEGATIVE   Ketones, ur 80 (A) NEGATIVE mg/dL   Protein, ur NEGATIVE NEGATIVE mg/dL   Nitrite NEGATIVE NEGATIVE   Leukocytes,Ua NEGATIVE NEGATIVE  Type and screen [facility]  Status: None   Collection Time: 07/25/20  7:34 PM  Result Value Ref Range   ABO/RH(D) A POS    Antibody Screen NEG    Sample Expiration      07/28/2020,2359 Performed at [facility], [address], [place], [place] [address]      Treatments: IV hydration  Discharge Exam: Blood pressure (!) 111/46, pulse 78, temperature 98.5 F (36.9 C), temperature source Oral, resp. rate 18, height 5\' 3"  (1.6 m), weight 66.2 kg, last menstrual period 12/17/2019, SpO2 100 %. General appearance: alert, cooperative, and no distress Resp: normal respiratory effort Cardio: normal rate GI: Abd: gravid, soft and non-tender Extremities: edema absent Skin: warm and dry Neurologic: Mental status: normal mood, behavior and judgement  Disposition: Discharge disposition: 01-Home or Self Care      Discharge Instructions     Discharge activity:   Complete by: As directed     Advised frequent breaks at work- out of the heat, off her feet and sitting down.   Discharge diet:  No restrictions   Complete by: As directed    No sexual activity restrictions   Complete by: As directed       Allergies as of 07/26/2020       Reactions   Aspirin Other (See Comments)   "stomach bleeds" Can take Advil        Medication List     TAKE these medications    ferrous fumarate 325 (106 Fe) MG Tabs tablet Commonly known as: HEMOCYTE - 106 mg FE Take 1 tablet (106 mg of iron total) by mouth every other day.   ondansetron 4 MG disintegrating tablet Commonly known as: Zofran ODT Take 1-2 tablets (4-8 mg total) by mouth every 6 (six) hours as needed for nausea or vomiting.   Prenatal Vitamins 28-0.8 MG Tabs Take 1 tablet by mouth daily.        Follow-up Information     [facility] Follow up in 1 week(s).   Specialty: Obstetrics and Gynecology Contact information: [address] [place] [phone]                Signed: [phone] 07/26/2020, 3:35 PM

## 2020-07-26 NOTE — Progress Notes (Signed)
ECHO reviewed- normal results.  Discussed with patient plan for discharge home today.  Reviewed conservative therapy and work accommodations.  Discussed importance of hydration.  Pt to f/u as scheduled for next OB appt.  Myna Hidalgo, DO Attending Obstetrician & Gynecologist, [facility] for [employer], [facility]

## 2020-07-26 NOTE — Plan of Care (Signed)

## 2020-07-27 ENCOUNTER — Ambulatory Visit (INDEPENDENT_AMBULATORY_CARE_PROVIDER_SITE_OTHER): Payer: BC Managed Care – PPO | Admitting: Obstetrics and Gynecology

## 2020-07-27 ENCOUNTER — Other Ambulatory Visit: Payer: Self-pay

## 2020-07-27 ENCOUNTER — Encounter: Payer: Self-pay | Admitting: Obstetrics and Gynecology

## 2020-07-27 VITALS — BP 103/65 | HR 100 | Wt 145.4 lb

## 2020-07-27 DIAGNOSIS — O99013 Anemia complicating pregnancy, third trimester: Secondary | ICD-10-CM

## 2020-07-27 DIAGNOSIS — D649 Anemia, unspecified: Secondary | ICD-10-CM

## 2020-07-27 DIAGNOSIS — O0993 Supervision of high risk pregnancy, unspecified, third trimester: Secondary | ICD-10-CM

## 2020-07-27 DIAGNOSIS — O09523 Supervision of elderly multigravida, third trimester: Secondary | ICD-10-CM

## 2020-07-27 DIAGNOSIS — O365990 Maternal care for other known or suspected poor fetal growth, unspecified trimester, not applicable or unspecified: Secondary | ICD-10-CM | POA: Insufficient documentation

## 2020-07-27 DIAGNOSIS — O365930 Maternal care for other known or suspected poor fetal growth, third trimester, not applicable or unspecified: Secondary | ICD-10-CM

## 2020-07-27 NOTE — Progress Notes (Signed)
Subjective:  Dominique Webb is a 35 y.o. G1P0 at [redacted]w[redacted]d being seen today for ongoing prenatal care.  She is currently monitored for the following issues for this high-risk pregnancy and has Tobacco abuse; Abnormal uterine bleeding (AUB); ASCUS of cervix with negative high risk HPV; Encounter for supervision of high risk pregnancy in third trimester, antepartum; AMA (advanced maternal age) multigravida 35+; Anemia; Anemia in pregnancy, third trimester; Syncope; and IUGR (intrauterine growth restriction) on their problem list.  Patient reports no complaints.  Contractions: Not present. Vag. Bleeding: None.  Movement: Present. Denies leaking of fluid.   The following portions of the patient's history were reviewed and updated as appropriate: allergies, current medications, past family history, past medical history, past social history, past surgical history and problem list. Problem list updated.  Still wants Nexplanon for birth control post partum and still deciding between two pediatricians. Knows to call pediatrician once decided.  Objective:   Vitals:   07/27/20 1422  BP: 103/65  Pulse: 100  Weight: 145 lb 6.4 oz (66 kg)    Fetal Status: Fetal Heart Rate (bpm): 143 Fundal Height: 28 cm Movement: Present     General:  Alert, oriented and cooperative. Patient is in no acute distress.  Skin: Skin is warm and dry. No rash noted.   Cardiovascular: Normal heart rate noted  Respiratory: Normal respiratory effort, no problems with respiration noted  Abdomen: Soft, gravid, appropriate for gestational age. Pain/Pressure: Absent     Pelvic: Vag. Bleeding: None     Cervical exam deferred        Extremities: Normal range of motion.  Edema: Trace  Mental Status: Normal mood and affect. Normal behavior. Normal judgment and thought content.    Assessment and Plan:  Pregnancy: G1P0 at [redacted]w[redacted]d  1. Encounter for supervision of high risk pregnancy in third trimester, antepartum Feeling well today,  denies bleeding, contractions and leaking of fluid. Repots feeling baby move. GTT normal, already received Tdap. -Follow up in 2 weeks  -Plan for GBS and GC/CT swabs at 36 wk visit -See separate problem for IUGR plan  2. Anemia, unspecified type Hx of anemia in pregnancy, taking Iron pills without concerns. Most recent H/H on 07/25/2020 10.9/33.1 - continue PO iron  5. Poor fetal growth affecting management of mother in third trimester, single or unspecified fetus Per [place] on 7/19 IUGR 1%ile, normal fluid, normal UA doppler. - Continue weekly [place], BPP, UA doppler until delivery. Weekly [place] already scheduled  Preterm labor symptoms and general obstetric precautions including but not limited to vaginal bleeding, contractions, leaking of fluid and fetal movement were reviewed in detail with the patient. Please refer to After Visit Summary for other counseling recommendations.  Return in about 2 weeks (around 08/10/2020) for ROB, High risk.   Warner Mccreedy, MD OB Fellow, [facility]

## 2020-07-31 ENCOUNTER — Ambulatory Visit: Payer: BC Managed Care – PPO | Attending: Obstetrics

## 2020-07-31 ENCOUNTER — Encounter: Payer: Self-pay | Admitting: *Deleted

## 2020-07-31 ENCOUNTER — Ambulatory Visit: Payer: BC Managed Care – PPO | Admitting: *Deleted

## 2020-07-31 ENCOUNTER — Other Ambulatory Visit: Payer: Self-pay

## 2020-07-31 VITALS — BP 108/51 | HR 62

## 2020-07-31 DIAGNOSIS — O365930 Maternal care for other known or suspected poor fetal growth, third trimester, not applicable or unspecified: Secondary | ICD-10-CM

## 2020-07-31 DIAGNOSIS — O09523 Supervision of elderly multigravida, third trimester: Secondary | ICD-10-CM

## 2020-07-31 DIAGNOSIS — Z3A33 33 weeks gestation of pregnancy: Secondary | ICD-10-CM

## 2020-07-31 DIAGNOSIS — O0993 Supervision of high risk pregnancy, unspecified, third trimester: Secondary | ICD-10-CM | POA: Insufficient documentation

## 2020-08-01 ENCOUNTER — Encounter: Payer: Self-pay | Admitting: *Deleted

## 2020-08-01 ENCOUNTER — Telehealth: Payer: Self-pay | Admitting: General Practice

## 2020-08-01 NOTE — Telephone Encounter (Signed)
Patient called and left message on nurse voicemail line stating she needs a letter excusing her from work as she was sick/throwing up. Patient states she was given a letter but it stated to return to work instead.  Called MFM and talked with RN who will create note for patient in mychart as patient was seen there yesterday. Called & informed patient. Patient verbalized understanding.

## 2020-08-07 ENCOUNTER — Ambulatory Visit: Payer: BC Managed Care – PPO | Attending: Obstetrics

## 2020-08-07 ENCOUNTER — Ambulatory Visit: Payer: BC Managed Care – PPO | Admitting: *Deleted

## 2020-08-07 ENCOUNTER — Encounter: Payer: Self-pay | Admitting: *Deleted

## 2020-08-07 ENCOUNTER — Other Ambulatory Visit: Payer: Self-pay | Admitting: *Deleted

## 2020-08-07 ENCOUNTER — Other Ambulatory Visit: Payer: Self-pay

## 2020-08-07 VITALS — BP 119/53 | HR 79

## 2020-08-07 DIAGNOSIS — O0993 Supervision of high risk pregnancy, unspecified, third trimester: Secondary | ICD-10-CM | POA: Diagnosis present

## 2020-08-07 DIAGNOSIS — O09523 Supervision of elderly multigravida, third trimester: Secondary | ICD-10-CM

## 2020-08-07 DIAGNOSIS — Z3A34 34 weeks gestation of pregnancy: Secondary | ICD-10-CM | POA: Diagnosis not present

## 2020-08-07 DIAGNOSIS — O365990 Maternal care for other known or suspected poor fetal growth, unspecified trimester, not applicable or unspecified: Secondary | ICD-10-CM

## 2020-08-07 DIAGNOSIS — O365930 Maternal care for other known or suspected poor fetal growth, third trimester, not applicable or unspecified: Secondary | ICD-10-CM | POA: Insufficient documentation

## 2020-08-08 ENCOUNTER — Telehealth: Payer: Self-pay | Admitting: *Deleted

## 2020-08-08 NOTE — Telephone Encounter (Signed)
Pt left message stating that she is still getting sick @ work and needs to know what can be done for her. 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 She cannot get any A/C unless she goes in the breakroom. Pt requests call back to discuss.

## 2020-08-09 NOTE — Telephone Encounter (Signed)
Left message for pt to return call to office or send mychart message to discuss. Pt has next appt on 08/11/20.  Judeth Cornfield, RN

## 2020-08-09 NOTE — Telephone Encounter (Signed)
Spoke with pt. 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 Pt was out for 1 week then back to normal work schedule. Pt concerned for herself and baby while she is still pregnant and getting overheated. Pt states there is not another option for her for light work in [employer].  Spoke with Dr Macon Large. Pt ok to have medical leave starting now if job will allow. Spoke back with pt. Pt states boss has allowed her to be off work until appt on 8/5 and will get FMLA papers to be filled between now and appt.   Judeth Cornfield, RN

## 2020-08-11 ENCOUNTER — Other Ambulatory Visit: Payer: Self-pay

## 2020-08-11 ENCOUNTER — Ambulatory Visit (INDEPENDENT_AMBULATORY_CARE_PROVIDER_SITE_OTHER): Payer: BC Managed Care – PPO | Admitting: Obstetrics and Gynecology

## 2020-08-11 ENCOUNTER — Encounter: Payer: Self-pay | Admitting: Obstetrics and Gynecology

## 2020-08-11 VITALS — BP 116/70 | HR 76 | Wt 145.0 lb

## 2020-08-11 DIAGNOSIS — Z720 Tobacco use: Secondary | ICD-10-CM

## 2020-08-11 DIAGNOSIS — Z3A34 34 weeks gestation of pregnancy: Secondary | ICD-10-CM

## 2020-08-11 DIAGNOSIS — Z3403 Encounter for supervision of normal first pregnancy, third trimester: Secondary | ICD-10-CM

## 2020-08-11 DIAGNOSIS — O365930 Maternal care for other known or suspected poor fetal growth, third trimester, not applicable or unspecified: Secondary | ICD-10-CM

## 2020-08-11 DIAGNOSIS — O09513 Supervision of elderly primigravida, third trimester: Secondary | ICD-10-CM

## 2020-08-11 DIAGNOSIS — O0993 Supervision of high risk pregnancy, unspecified, third trimester: Secondary | ICD-10-CM

## 2020-08-11 NOTE — Patient Instructions (Addendum)

## 2020-08-11 NOTE — Progress Notes (Signed)
   PRENATAL VISIT NOTE  Subjective:  Dominique Webb is a 35 y.o. G1P0 at [redacted]w[redacted]d being seen today for ongoing prenatal care.  She is currently monitored for the following issues for this high-risk pregnancy and has Tobacco abuse; Abnormal uterine bleeding (AUB); ASCUS of cervix with negative high risk HPV; Encounter for supervision of high risk pregnancy in third trimester, antepartum; AMA (advanced maternal age) multigravida 35+; Anemia; Anemia in pregnancy, third trimester; Syncope; and IUGR (intrauterine growth restriction) on their problem list.  Patient reports no complaints.  Contractions: Not present. Vag. Bleeding: None.  Movement: Present. Denies leaking of fluid.   The following portions of the patient's history were reviewed and updated as appropriate: allergies, current medications, past family history, past medical history, past social history, past surgical history and problem list.   Objective:   Vitals:   08/11/20 1017  BP: 116/70  Pulse: 76  Weight: 145 lb (65.8 kg)    Fetal Status: Fetal Heart Rate (bpm): 138 Fundal Height: 33 cm Movement: Present     General:  Alert, oriented and cooperative. Patient is in no acute distress.  Skin: Skin is warm and dry. No rash noted.   Cardiovascular: Normal heart rate noted  Respiratory: Normal respiratory effort, no problems with respiration noted  Abdomen: Soft, gravid, appropriate for gestational age.  Pain/Pressure: Present     Pelvic: Cervical exam deferred        Extremities: Normal range of motion.  Edema: Trace  Mental Status: Normal mood and affect. Normal behavior. Normal judgment and thought content.   Assessment and Plan:  Pregnancy: G1P0 at [redacted]w[redacted]d 1. Encounter for supervision of high risk pregnancy in third trimester, antepartum - Follow up in 1 week - GBS/GC/CT at that time for plan for IOL at 37-38w per MFM - See IUGR for additional plan  2. Poor fetal growth affecting management of mother in third trimester,  single or unspecified fetus - Continue weekly BPP/NST/dopplers as scheduled - Reviewed FM precautions - Last US showed growth 6%ile on 8/1, normal afi and cephalic - Discussed IOL at 37-38wks - We discussed work note and provided that today to discontinue work - works in a [employer]. Work note given today. Will have FMLA updated with [employer] and pt will pick up copy on Monday.   3. Tobacco abuse - Quit with the pregnancy  4. Anemia  - Continue Iron  Preterm labor symptoms and general obstetric precautions including but not limited to vaginal bleeding, contractions, leaking of fluid and fetal movement were reviewed in detail with the patient. Please refer to After Visit Summary for other counseling recommendations.   Return in 1 week (on 08/18/2020).  Future Appointments  Date Time Provider Department Center  08/14/2020  1:00 PM [facility] NURSE [facility]  08/14/2020  1:15 PM [facility]  08/22/2020  1:00 PM [facility] NURSE [facility]  08/22/2020  1:15 PM [facility]  08/28/2020 12:30 PM [facility] N[facility]SE [facility]  08/28/2020 12:45 PM [facility]5 [facility]    Milas Hock, MD

## 2020-08-14 ENCOUNTER — Encounter: Payer: Self-pay | Admitting: *Deleted

## 2020-08-14 ENCOUNTER — Other Ambulatory Visit: Payer: Self-pay

## 2020-08-14 ENCOUNTER — Ambulatory Visit: Payer: BC Managed Care – PPO | Attending: Obstetrics

## 2020-08-14 ENCOUNTER — Ambulatory Visit: Payer: BC Managed Care – PPO | Admitting: *Deleted

## 2020-08-14 VITALS — BP 109/54 | HR 91

## 2020-08-14 DIAGNOSIS — O0993 Supervision of high risk pregnancy, unspecified, third trimester: Secondary | ICD-10-CM | POA: Insufficient documentation

## 2020-08-14 DIAGNOSIS — Z3A35 35 weeks gestation of pregnancy: Secondary | ICD-10-CM | POA: Diagnosis not present

## 2020-08-14 DIAGNOSIS — O09523 Supervision of elderly multigravida, third trimester: Secondary | ICD-10-CM | POA: Diagnosis present

## 2020-08-14 DIAGNOSIS — O365930 Maternal care for other known or suspected poor fetal growth, third trimester, not applicable or unspecified: Secondary | ICD-10-CM | POA: Diagnosis present

## 2020-08-15 ENCOUNTER — Encounter: Payer: Self-pay | Admitting: Nurse Practitioner

## 2020-08-15 ENCOUNTER — Ambulatory Visit (INDEPENDENT_AMBULATORY_CARE_PROVIDER_SITE_OTHER): Payer: BC Managed Care – PPO | Admitting: Nurse Practitioner

## 2020-08-15 VITALS — BP 103/63 | HR 88 | Wt 144.2 lb

## 2020-08-15 DIAGNOSIS — O0993 Supervision of high risk pregnancy, unspecified, third trimester: Secondary | ICD-10-CM

## 2020-08-15 DIAGNOSIS — Z3A35 35 weeks gestation of pregnancy: Secondary | ICD-10-CM

## 2020-08-15 DIAGNOSIS — O365930 Maternal care for other known or suspected poor fetal growth, third trimester, not applicable or unspecified: Secondary | ICD-10-CM

## 2020-08-15 DIAGNOSIS — R55 Syncope and collapse: Secondary | ICD-10-CM

## 2020-08-15 NOTE — Progress Notes (Addendum)
    Subjective:  Dominique Webb is a 35 y.o. G1P0 at [redacted]w[redacted]d being seen today for ongoing prenatal care.  She is currently monitored for the following issues for this high-risk pregnancy and has Tobacco abuse; Abnormal uterine bleeding (AUB); ASCUS of cervix with negative high risk HPV; Encounter for supervision of high risk pregnancy in third trimester, antepartum; AMA (advanced maternal age) multigravida 35+; Anemia; Anemia in pregnancy, third trimester; Syncope; and IUGR (intrauterine growth restriction) on their problem list.  Patient reports no complaints.  Contractions: Not present. Vag. Bleeding: None.  Movement: Present. Denies leaking of fluid.   The following portions of the patient's history were reviewed and updated as appropriate: allergies, current medications, past family history, past medical history, past social history, past surgical history and problem list. Problem list updated.  Objective:   Vitals:   08/15/20 1026  BP: 103/63  Pulse: 88  Weight: 144 lb 3.2 oz (65.4 kg)    Fetal Status: Fetal Heart Rate (bpm): 130 Fundal Height: 33 cm Movement: Present     General:  Alert, oriented and cooperative. Patient is in no acute distress.  Skin: Skin is warm and dry. No rash noted.   Cardiovascular: Normal heart rate noted  Respiratory: Normal respiratory effort, no problems with respiration noted  Abdomen: Soft, gravid, appropriate for gestational age. Pain/Pressure: Absent     Pelvic:  Cervical exam deferred        Extremities: Normal range of motion.  Edema: Trace  Mental Status: Normal mood and affect. Normal behavior. Normal judgment and thought content.   Urinalysis:      Assessment and Plan:  Pregnancy: G1P0 at [redacted]w[redacted]d  1. Encounter for supervision of high risk pregnancy in third trimester, antepartum Doing well.  Baby moving well.  No edema in ankles Quit smoking this pregnancy  2. Poor fetal growth affecting management of mother in third trimester, single or  unspecified fetus Being followed by MFM - recommending induction at 38 weeks - fetal growth currently at 6th percentile - previously at 1st percentile Dopplers showing normal forward diastolic flow  3. Syncope, unspecified syncope type No further problem since she is out of work and not working in [employer] with no air conditioning  4. [redacted] weeks gestation of pregnancy Return in one week for vaginal swabs  Preterm labor symptoms and general obstetric precautions including but not limited to vaginal bleeding, contractions, leaking of fluid and fetal movement were reviewed in detail with the patient. Please refer to After Visit Summary for other counseling recommendations.  Return in about 1 week (around 08/22/2020) for in person ROB .  Nolene Bernheim, RN, MSN, NP-BC Nurse Practitioner, [facility] for [employer], [facility] 08/15/2020 10:47 AM

## 2020-08-22 ENCOUNTER — Ambulatory Visit: Payer: BC Managed Care – PPO | Attending: Obstetrics

## 2020-08-22 ENCOUNTER — Ambulatory Visit: Payer: BC Managed Care – PPO | Admitting: *Deleted

## 2020-08-22 ENCOUNTER — Encounter: Payer: Self-pay | Admitting: *Deleted

## 2020-08-22 ENCOUNTER — Ambulatory Visit (HOSPITAL_BASED_OUTPATIENT_CLINIC_OR_DEPARTMENT_OTHER): Payer: BC Managed Care – PPO | Admitting: *Deleted

## 2020-08-22 ENCOUNTER — Other Ambulatory Visit: Payer: Self-pay | Admitting: Obstetrics

## 2020-08-22 ENCOUNTER — Other Ambulatory Visit: Payer: Self-pay

## 2020-08-22 VITALS — BP 114/58 | HR 63

## 2020-08-22 DIAGNOSIS — O0993 Supervision of high risk pregnancy, unspecified, third trimester: Secondary | ICD-10-CM | POA: Diagnosis present

## 2020-08-22 DIAGNOSIS — O365990 Maternal care for other known or suspected poor fetal growth, unspecified trimester, not applicable or unspecified: Secondary | ICD-10-CM | POA: Diagnosis present

## 2020-08-22 DIAGNOSIS — O09523 Supervision of elderly multigravida, third trimester: Secondary | ICD-10-CM | POA: Diagnosis present

## 2020-08-22 DIAGNOSIS — Z3A36 36 weeks gestation of pregnancy: Secondary | ICD-10-CM | POA: Diagnosis not present

## 2020-08-22 DIAGNOSIS — O365930 Maternal care for other known or suspected poor fetal growth, third trimester, not applicable or unspecified: Secondary | ICD-10-CM | POA: Diagnosis present

## 2020-08-22 DIAGNOSIS — O283 Abnormal ultrasonic finding on antenatal screening of mother: Secondary | ICD-10-CM | POA: Diagnosis present

## 2020-08-22 NOTE — Procedures (Signed)
Dominique Webb 1985/08/02 [redacted]w[redacted]d  Fetus A Non-Stress Test Interpretation for 08/22/20  Indication: Unsatisfactory BPP, IUGR  Fetal Heart Rate A Mode: External Baseline Rate (A): 125 bpm Variability: Moderate Accelerations: 15 x 15 Decelerations: None Multiple birth?: No  Uterine Activity Mode: Palpation, Toco Contraction Frequency (min): 1 uc with ui Contraction Duration (sec): 40 Contraction Quality: Mild Resting Tone Palpated: Relaxed Resting Time: Adequate  Interpretation (Fetal Testing) Nonstress Test Interpretation: Reactive Overall Impression: Reassuring for gestational age Comments: Dr. Grace Bushy reviewed tracing

## 2020-08-23 ENCOUNTER — Ambulatory Visit (INDEPENDENT_AMBULATORY_CARE_PROVIDER_SITE_OTHER): Payer: BC Managed Care – PPO | Admitting: Family Medicine

## 2020-08-23 ENCOUNTER — Other Ambulatory Visit (HOSPITAL_COMMUNITY)
Admission: RE | Admit: 2020-08-23 | Discharge: 2020-08-23 | Disposition: A | Discharge status: Home / Self Care | Payer: BC Managed Care – PPO | Source: Ambulatory Visit | Attending: Family Medicine | Admitting: Family Medicine

## 2020-08-23 VITALS — BP 117/70 | HR 80 | Wt 143.3 lb

## 2020-08-23 DIAGNOSIS — O09523 Supervision of elderly multigravida, third trimester: Secondary | ICD-10-CM

## 2020-08-23 DIAGNOSIS — O0993 Supervision of high risk pregnancy, unspecified, third trimester: Secondary | ICD-10-CM | POA: Diagnosis present

## 2020-08-23 DIAGNOSIS — O365930 Maternal care for other known or suspected poor fetal growth, third trimester, not applicable or unspecified: Secondary | ICD-10-CM

## 2020-08-23 DIAGNOSIS — Z3A36 36 weeks gestation of pregnancy: Secondary | ICD-10-CM

## 2020-08-23 NOTE — Patient Instructions (Addendum)
AREA PEDIATRIC/FAMILY PRACTICE PHYSICIANS  [facility] [address] [place], [place] [address] Phone - [phone]   Fax - [phone]  [facility] [address] [place], [place]  [address] Phone - [phone]   Fax - [phone]  [facility] [address] [place], [place]  [address] Phone - [phone]   Fax - [phone]  [facility] [address] [place], [place]  [address] Phone - [phone]   Fax - [phone]  [facility] [address] [place], [place]  [address] Phone - [phone]   Fax - [phone]  [facility] [address] [place], [place]  [address] Phone - [phone]   Fax - [phone]  [facility] [address] [place], [place]  [address] Phone - [phone]   Fax - [phone]  [facility] AT [facility] [address] [place], [address] [place], [place]  [address] Phone - [phone]   Fax - [phone]  [facility] AT [facility] [address] [place], [place]  [address] Phone - [phone]   Fax - [phone] [facility] AT [place] [address] [place], [place]  [address] Phone - [phone]   Fax - [phone]  [facility] [address] [place], [place]  [address] Phone - [phone]   Fax - [phone]  [facility] [address] [place], [place]  [address] Phone - [phone]   Fax - [phone]  [facility] [place] [address] [place], [place]  [address] Phone - [phone]   Fax - [phone]  [facility] [address] [place], [place]  [address] Phone - [phone]  [facility] [address] [place], [place]  [address] Phone - [phone]   Fax - [phone]  [facility] [address] [place], [place]  [address] Phone - [phone]   Fax - [phone]  [facility] [address] [place], [place]  [address] Phone - [phone]   Fax - [phone]  [facility] [address] [place], [place]  [address] Phone - [phone]   Fax - [phone]   [facility] [address] [place], [place]  [address] Phone - [phone]   Fax - [phone]  [facility] [address] [place], [place]  [address] Phone - [phone]   Fax - [phone]  [facility] [address] [place], [place]  [address] Phone - [phone]   Fax - [phone]  [facility] [address] [place], [place]  [address] Phone - [phone]   Fax - [phone]  [facility] [address] [place], [place]  [address] Phone - [phone]   Fax - [phone] Gerarda Fraction [address] [place], [place]  [address] Phone - [phone]   Fax - [phone]  [facility] [address] [place], [place]  [address] Phone - [phone]   Fax - [phone]  [facility] - [place] [address] [place], [place]  [address] Phone - [phone]   Fax - [phone]    Contraception Choices Contraception, also called birth control, refers to methods or devices thatprevent pregnancy. Hormonal methods  Contraceptive implant A contraceptive implant is a thin, plastic tube that contains a hormone that  prevents pregnancy. It is different from an intrauterine device (IUD). It is inserted into the upper part of the arm by a health care provider. Implants canbe effective for up to 3 years. Progestin-only injections Progestin-only injections are injections of progestin, a synthetic form of thehormone progesterone. They are given every 3 months by a health care provider. Birth control  pills Birth control pills are pills that contain hormones that prevent pregnancy. They must be taken once a day, preferably at the same time each day. Aprescription is needed to use this method of contraception. Birth control patch The birth control patch contains hormones that prevent pregnancy. It is placed on the skin and must be changed once a week for three weeks and removed on thefourth week. A prescription is needed to use this method of contraception. Vaginal ring A vaginal ring contains hormones that prevent pregnancy. It is placed in the vagina for three weeks and removed on the fourth week. After that, the process is repeated with a new ring. A prescription is needed to use this method ofcontraception. Emergency contraceptive Emergency contraceptives prevent pregnancy after unprotected sex. They come in pill form and can be taken up to 5 days after sex. They work best the sooner they are taken after having sex. Most emergency contraceptives are available without a prescription. This method should not be used as your only form ofbirth control. Barrier methods  Female condom A female condom is a thin sheath that is worn over the penis during sex. Condoms keep sperm from going inside a woman's body. They can be used with a sperm-killing substance (spermicide) to increase their effectiveness. They should be thrown away after one use. Female condom A female condom is a soft, loose-fitting sheath that is put into the vagina before sex. The condom keeps sperm from going inside a woman's body. Theyshould be thrown away after one use. Diaphragm A diaphragm is a soft, dome-shaped barrier. It is inserted into the vagina before sex, along with a spermicide. The diaphragm blocks sperm from entering the uterus, and the spermicide kills sperm. A diaphragm should be left in thevagina for 6-8 hours after sex and removed within 24 hours. A diaphragm is prescribed and fitted by a health care provider. A  diaphragm should be replaced every 1-2 years, after giving birth, after gaining more than15 lb (6.8 kg), and after pelvic surgery. Cervical cap A cervical cap is a round, soft latex or plastic cup that fits over the cervix. It is inserted into the vagina before sex, along with spermicide. It blocks sperm from entering the uterus. The cap should be left in place for 6-8 hours after sex and removed within 48 hours. A cervical cap must be prescribed andfitted by a health care provider. It should be replaced every 2 years. Sponge A sponge is a soft, circular piece of polyurethane foam with spermicide in it. The sponge helps block sperm from entering the uterus, and the spermicide kills sperm. To use it, you make it wet and then insert it into the vagina. It should be inserted before sex, left in for at least 6 hours after sex, and removed andthrown away within 30 hours. Spermicides Spermicides are chemicals that kill or block sperm from entering the cervix and uterus. They can come as a cream, jelly, suppository, foam, or tablet. A spermicide should be inserted into the vagina with an applicator at least 10-15 minutes before sex to allow time for it to work. The process must be repeatedevery time  you have sex. Spermicides do not require a prescription. Intrauterine contraception Intrauterine device (IUD) An IUD is a T-shaped device that is put in a woman's uterus. There are two types: Hormone IUD.This type contains progestin, a synthetic form of the hormone progesterone. This type can stay in place for 3-5 years. Copper IUD.This type is wrapped in copper wire. It can stay in place for 10 years. Permanent methods of contraception Female tubal ligation In this method, a woman's fallopian tubes are sealed, tied, or blocked duringsurgery to prevent eggs from traveling to the uterus. Hysteroscopic sterilization In this method, a small, flexible insert is placed into each fallopian tube. The inserts cause scar  tissue to form in the fallopian tubes and block them, so sperm cannot reach an egg. The procedure takes about 3 months to be effective.Another form of birth control must be used during those 3 months. Female sterilization This is a procedure to tie off the tubes that carry sperm (vasectomy). After the procedure, the man can still ejaculate fluid (semen). Another form of birth control must be used for 3 months after the procedure. Natural planning methods Natural family planning In this method, a couple does not have sex on days when the woman could become pregnant. Calendar method In this method, the woman keeps track of the length of each menstrual cycle, identifies the days when pregnancy can happen, and does not have sex on those days. Ovulation method In this method, a couple avoids sex during ovulation. Symptothermal method This method involves not having sex during ovulation. The woman typically checks for ovulation bywatching changes in her temperature and in the consistency of cervical mucus. Post-ovulation method In this method, a couple waits to have sex until after ovulation. Where to find more information Centers for Disease Control and Prevention: [employer] Summary Contraception, also called birth control, refers to methods or devices that prevent pregnancy. Hormonal methods of contraception include implants, injections, pills, patches, vaginal rings, and emergency contraceptives. Barrier methods of contraception can include female condoms, female condoms, diaphragms, cervical caps, sponges, and spermicides. There are two types of IUDs (intrauterine devices). An IUD can be put in a woman's uterus to prevent pregnancy for 3-5 years. Permanent sterilization can be done through a procedure for males and females. Natural family planning methods involve nothaving sex on days when the woman could become pregnant. This information is not intended to replace advice given to you by your  health care provider. Make sure you discuss any questions you have with your healthcare provider. Document Revised: 05/31/2019 Document Reviewed: 05/31/2019 Elsevier Patient Education  2022 [employer].

## 2020-08-23 NOTE — Progress Notes (Signed)
   Subjective:  Dominique Webb is a 35 y.o. G1P0 at [redacted]w[redacted]d being seen today for ongoing prenatal care.  She is currently monitored for the following issues for this high-risk pregnancy and has Tobacco abuse; Abnormal uterine bleeding (AUB); ASCUS of cervix with negative high risk HPV; Encounter for supervision of high risk pregnancy in third trimester, antepartum; AMA (advanced maternal age) multigravida 35+; Anemia; Anemia in pregnancy, third trimester; Syncope; and IUGR (intrauterine growth restriction) on their problem list.  Patient reports no complaints.  Contractions: Irritability. Vag. Bleeding: None.  Movement: Present. Denies leaking of fluid.   The following portions of the patient's history were reviewed and updated as appropriate: allergies, current medications, past family history, past medical history, past social history, past surgical history and problem list. Problem list updated.  Objective:   Vitals:   08/23/20 1549  BP: 117/70  Pulse: 80  Weight: 143 lb 4.8 oz (65 kg)    Fetal Status: Fetal Heart Rate (bpm): 140   Movement: Present     General:  Alert, oriented and cooperative. Patient is in no acute distress.  Skin: Skin is warm and dry. No rash noted.   Cardiovascular: Normal heart rate noted  Respiratory: Normal respiratory effort, no problems with respiration noted  Abdomen: Soft, gravid, appropriate for gestational age. Pain/Pressure: Present     Pelvic: Vag. Bleeding: None     Cervical exam deferred        Extremities: Normal range of motion.  Edema: Trace  Mental Status: Normal mood and affect. Normal behavior. Normal judgment and thought content.   Urinalysis:      Assessment and Plan:  Pregnancy: G1P0 at [redacted]w[redacted]d  1. Encounter for supervision of high risk pregnancy in third trimester, antepartum BP and FHR normal Swabs today - GC/Chlamydia probe amp ([facility])not at [facility] - Culture, beta strep (group b only)  2. Poor fetal growth affecting  management of mother in third trimester, single or unspecified fetus IUGR with 6% last [place] on 08/07/20 Following w MFM Dopplers have been normal Delivery between 38-39 wks, discuss timing at next visit pending follow up dopplers  3. Multigravida of advanced maternal age in third trimester   Preterm labor symptoms and general obstetric precautions including but not limited to vaginal bleeding, contractions, leaking of fluid and fetal movement were reviewed in detail with the patient. Please refer to After Visit Summary for other counseling recommendations.  Return in 1 week (on 08/30/2020).   Venora Maples, MD

## 2020-08-24 LAB — GC/CHLAMYDIA PROBE AMP (~~LOC~~) NOT AT ARMC
Chlamydia: NEGATIVE
Comment: NEGATIVE
Comment: NORMAL
Neisseria Gonorrhea: NEGATIVE

## 2020-08-27 LAB — CULTURE, BETA STREP (GROUP B ONLY): Strep Gp B Culture: NEGATIVE

## 2020-08-28 ENCOUNTER — Other Ambulatory Visit: Payer: Self-pay | Admitting: Obstetrics

## 2020-08-28 ENCOUNTER — Other Ambulatory Visit: Payer: Self-pay

## 2020-08-28 ENCOUNTER — Ambulatory Visit: Payer: BC Managed Care – PPO | Attending: Obstetrics | Admitting: Obstetrics

## 2020-08-28 ENCOUNTER — Ambulatory Visit (HOSPITAL_BASED_OUTPATIENT_CLINIC_OR_DEPARTMENT_OTHER): Payer: BC Managed Care – PPO

## 2020-08-28 ENCOUNTER — Ambulatory Visit: Payer: BC Managed Care – PPO | Admitting: *Deleted

## 2020-08-28 VITALS — BP 113/54 | HR 76

## 2020-08-28 DIAGNOSIS — O0993 Supervision of high risk pregnancy, unspecified, third trimester: Secondary | ICD-10-CM | POA: Insufficient documentation

## 2020-08-28 DIAGNOSIS — Z3A37 37 weeks gestation of pregnancy: Secondary | ICD-10-CM

## 2020-08-28 DIAGNOSIS — O09513 Supervision of elderly primigravida, third trimester: Secondary | ICD-10-CM

## 2020-08-28 DIAGNOSIS — O365990 Maternal care for other known or suspected poor fetal growth, unspecified trimester, not applicable or unspecified: Secondary | ICD-10-CM

## 2020-08-28 DIAGNOSIS — O09523 Supervision of elderly multigravida, third trimester: Secondary | ICD-10-CM | POA: Insufficient documentation

## 2020-08-28 DIAGNOSIS — O365930 Maternal care for other known or suspected poor fetal growth, third trimester, not applicable or unspecified: Secondary | ICD-10-CM | POA: Insufficient documentation

## 2020-08-28 DIAGNOSIS — O365931 Maternal care for other known or suspected poor fetal growth, third trimester, fetus 1: Secondary | ICD-10-CM

## 2020-08-28 NOTE — Procedures (Signed)
Dominique Webb 10/23/1985 [redacted]w[redacted]d  Fetus A Non-Stress Test Interpretation for 08/28/20  Indication: IUGR  Fetal Heart Rate A Mode: External Baseline Rate (A): 135 bpm Variability: Moderate Accelerations: 15 x 15 Decelerations: None Multiple birth?: No  Uterine Activity Mode: Palpation, Toco Contraction Frequency (min): 1 uc with ui Contraction Duration (sec): 120 Contraction Quality: Mild Resting Tone Palpated: Relaxed Resting Time: Adequate  Interpretation (Fetal Testing) Nonstress Test Interpretation: Reactive Overall Impression: Reassuring for gestational age Comments: Dr. Parke Poisson reviewed tracing.

## 2020-08-29 ENCOUNTER — Other Ambulatory Visit: Payer: Self-pay | Admitting: Advanced Practice Midwife

## 2020-08-29 NOTE — Progress Notes (Signed)
MFM Note  Dominique Webb was seen for a follow up growth scan due to fetal growth restriction.  She denies any problems since her last exam and reports feeling vigorous fetal movements throughout the day.    On today's exam, the EFW measures at the less than the first percentile for her gestational age indicating severe fetal growth restriction.  The fetus has grown only 7 ounces over the past 3 weeks.  There was normal amniotic fluid noted.    A biophysical profile performed today due to fetal growth restriction was 10 out of 10 with a reactive NST.   Doppler studies of the umbilical arteries showed a normal S/D ratio of 2.46.  There were no signs of absent or reversed end-diastolic flow.    Due to severe fetal growth restriction and limited fetal growth, at her current gestational age, delivery is recommended.  I have discussed her case with the second attending who will be scheduling her induction for later this week.  A total of 20 minutes was spent counseling and coordinating the care for this patient.  Greater than 50% of the time was spent in direct face-to-face contact.

## 2020-08-30 ENCOUNTER — Inpatient Hospital Stay (HOSPITAL_COMMUNITY)
Admission: AD | Admit: 2020-08-30 | Discharge: 2020-09-02 | DRG: 807 | Disposition: A | Discharge status: Home / Self Care | Payer: BC Managed Care – PPO | Attending: Obstetrics & Gynecology | Admitting: Obstetrics & Gynecology

## 2020-08-30 ENCOUNTER — Encounter (HOSPITAL_COMMUNITY): Payer: Self-pay | Admitting: Obstetrics & Gynecology

## 2020-08-30 ENCOUNTER — Telehealth: Payer: Self-pay | Admitting: Family Medicine

## 2020-08-30 ENCOUNTER — Other Ambulatory Visit: Payer: Self-pay

## 2020-08-30 ENCOUNTER — Inpatient Hospital Stay (HOSPITAL_COMMUNITY): Payer: BC Managed Care – PPO

## 2020-08-30 DIAGNOSIS — Z30017 Encounter for initial prescription of implantable subdermal contraceptive: Secondary | ICD-10-CM

## 2020-08-30 DIAGNOSIS — O365931 Maternal care for other known or suspected poor fetal growth, third trimester, fetus 1: Secondary | ICD-10-CM | POA: Diagnosis not present

## 2020-08-30 DIAGNOSIS — Z87891 Personal history of nicotine dependence: Secondary | ICD-10-CM

## 2020-08-30 DIAGNOSIS — O365930 Maternal care for other known or suspected poor fetal growth, third trimester, not applicable or unspecified: Principal | ICD-10-CM | POA: Diagnosis present

## 2020-08-30 DIAGNOSIS — O365990 Maternal care for other known or suspected poor fetal growth, unspecified trimester, not applicable or unspecified: Secondary | ICD-10-CM | POA: Diagnosis present

## 2020-08-30 DIAGNOSIS — Z3A37 37 weeks gestation of pregnancy: Secondary | ICD-10-CM

## 2020-08-30 DIAGNOSIS — O09523 Supervision of elderly multigravida, third trimester: Secondary | ICD-10-CM

## 2020-08-30 DIAGNOSIS — O0993 Supervision of high risk pregnancy, unspecified, third trimester: Secondary | ICD-10-CM

## 2020-08-30 DIAGNOSIS — O09513 Supervision of elderly primigravida, third trimester: Secondary | ICD-10-CM | POA: Diagnosis present

## 2020-08-30 LAB — CBC
HCT: 38.6 % (ref 36.0–46.0)
Hemoglobin: 12.4 g/dL (ref 12.0–15.0)
MCH: 31.2 pg (ref 26.0–34.0)
MCHC: 32.1 g/dL (ref 30.0–36.0)
MCV: 97.2 fL (ref 80.0–100.0)
Platelets: 220 10*3/uL (ref 150–400)
RBC: 3.97 MIL/uL (ref 3.87–5.11)
RDW: 13.2 % (ref 11.5–15.5)
WBC: 13.4 10*3/uL — ABNORMAL HIGH (ref 4.0–10.5)
nRBC: 0 % (ref 0.0–0.2)

## 2020-08-30 LAB — TYPE AND SCREEN
ABO/RH(D): A POS
Antibody Screen: NEGATIVE

## 2020-08-30 MED ORDER — LACTATED RINGERS IV SOLN
500.0000 mL | INTRAVENOUS | Status: DC | PRN
  Administered 2020-08-30: 500 mL via INTRAVENOUS

## 2020-08-30 MED ORDER — ACETAMINOPHEN 325 MG PO TABS: 650.0000 mg | ORAL_TABLET | ORAL | Status: DC | PRN

## 2020-08-30 MED ORDER — TERBUTALINE SULFATE 1 MG/ML IJ SOLN
0.2500 mg | Freq: Once | INTRAMUSCULAR | Status: AC | PRN
Start: 2020-08-30 — End: 2020-08-31
  Administered 2020-08-31: 0.25 mg via SUBCUTANEOUS
  Filled 2020-08-30: qty 1

## 2020-08-30 MED ORDER — ZOLPIDEM TARTRATE 5 MG PO TABS
5.0000 mg | ORAL_TABLET | Freq: Every evening | ORAL | Status: DC | PRN
  Administered 2020-08-30: 5 mg via ORAL
  Filled 2020-08-30: qty 1

## 2020-08-30 MED ORDER — ONDANSETRON HCL 4 MG/2ML IJ SOLN
4.0000 mg | Freq: Four times a day (QID) | INTRAMUSCULAR | Status: DC | PRN
  Administered 2020-08-30 – 2020-08-31 (×2): 4 mg via INTRAVENOUS
  Filled 2020-08-30 (×2): qty 2

## 2020-08-30 MED ORDER — MISOPROSTOL 25 MCG QUARTER TABLET
25.0000 ug | ORAL_TABLET | ORAL | Status: DC | PRN
  Administered 2020-08-30 (×2): 25 ug via VAGINAL
  Filled 2020-08-30 (×2): qty 1

## 2020-08-30 MED ORDER — OXYCODONE-ACETAMINOPHEN 5-325 MG PO TABS: 1.0000 | ORAL_TABLET | ORAL | Status: DC | PRN

## 2020-08-30 MED ORDER — OXYTOCIN BOLUS FROM INFUSION: 333.0000 mL | Freq: Once | INTRAVENOUS | Status: DC

## 2020-08-30 MED ORDER — LACTATED RINGERS IV SOLN: INTRAVENOUS | Status: DC

## 2020-08-30 MED ORDER — OXYTOCIN-SODIUM CHLORIDE 30-0.9 UT/500ML-% IV SOLN
2.5000 [IU]/h | INTRAVENOUS | Status: DC
  Filled 2020-08-30: qty 500

## 2020-08-30 MED ORDER — LIDOCAINE HCL (PF) 1 % IJ SOLN: 30.0000 mL | INTRAMUSCULAR | Status: DC | PRN

## 2020-08-30 MED ORDER — SOD CITRATE-CITRIC ACID 500-334 MG/5ML PO SOLN: 30.0000 mL | ORAL | Status: DC | PRN

## 2020-08-30 MED ORDER — FENTANYL CITRATE (PF) 100 MCG/2ML IJ SOLN
50.0000 ug | INTRAMUSCULAR | Status: DC | PRN
  Administered 2020-08-31: 100 ug via INTRAVENOUS
  Administered 2020-08-31: 50 ug via INTRAVENOUS
  Filled 2020-08-30 (×2): qty 2

## 2020-08-30 MED ORDER — OXYCODONE-ACETAMINOPHEN 5-325 MG PO TABS: 2.0000 | ORAL_TABLET | ORAL | Status: DC | PRN

## 2020-08-30 NOTE — H&P (Addendum)
OBSTETRIC ADMISSION HISTORY AND PHYSICAL  Dominique Webb is a 35 y.o. female G1P0 with IUP at [redacted]w[redacted]d by early [place] presenting for IOL d/t IUGR. She reports +FMs, No LOF, no VB, no blurry vision, headaches or peripheral edema, and RUQ pain.  She plans on breast feeding. She requests Nexplanon for birth control. She received her prenatal care at [facility]  Dating: By early [place] --->  Estimated Date of Delivery: 09/18/20  Sono:    @[redacted]w[redacted]d , CWD, normal anatomy, cephalic presentation, anterior placental lie, 321g, 33% EFW  Prenatal History/Complications:  IUGR >1% AMA Mild anemia hgb 10.9 Pap: NILM HPV+ on 02/2020, previous ASCUS with HPV- in 2018  Past Medical History: Past Medical History:  Diagnosis Date   Medical history non-contributory     Past Surgical History: Past Surgical History:  Procedure Laterality Date   WISDOM TOOTH EXTRACTION      Obstetrical History: OB History     Gravida  1   Para      Term      Preterm      AB      Living         SAB      IAB      Ectopic      Multiple      Live Births              Social History Social History   Socioeconomic History   Marital status: Single    Spouse name: Not on file   Number of children: Not on file   Years of education: 16   Highest education level: Associate degree: academic program  Occupational History   Not on file  Tobacco Use   Smoking status: Former    Packs/day: 0.50    Years: 16.00    Pack years: 8.00    Types: Cigarettes    Start date: 2004    Quit date: 04/07/2020    Years since quitting: 0.3   Smokeless tobacco: Never   Tobacco comments:    pt smokes some days - quit with pregnancy  Vaping Use   Vaping Use: Never used  Substance and Sexual Activity   Alcohol use: Not Currently    Comment: Social   Drug use: No   Sexual activity: Yes    Birth control/protection: Injection, None  Other Topics Concern   Not on file  Social History Narrative   Not on file   Social  Determinants of Health   Financial Resource Strain: Not on file  Food Insecurity: No Food Insecurity   Worried About Running Out of Food in the Last Year: Never true   Ran Out of Food in the Last Year: Never true  Transportation Needs: No Transportation Needs   Lack of Transportation (Medical): No   Lack of Transportation (Non-Medical): No  Physical Activity: Not on file  Stress: Not on file  Social Connections: Not on file    Family History: Family History  Problem Relation Age of Onset   Diabetes Mother    Hypertension Mother    Hyperlipidemia Mother    Stroke Father    Hypertension Father     Allergies: Allergies  Allergen Reactions   Aspirin Other (See Comments)    "stomach bleeds" Can take Advil    Medications Prior to Admission  Medication Sig Dispense Refill Last Dose   ferrous fumarate (HEMOCYTE - 106 MG FE) 325 (106 Fe) MG TABS tablet Take 1 tablet (106 mg of iron total)  by mouth every other day. 30 tablet 0    Prenatal Vit-Fe Fumarate-FA (PRENATAL VITAMINS) 28-0.8 MG TABS Take 1 tablet by mouth daily. 30 tablet 12      Review of Systems   All systems reviewed and negative except as stated in HPI  Last menstrual period 12/17/2019. General appearance: alert, cooperative, appears stated age, and no distress Lungs: clear to auscultation bilaterally Heart: regular rate and rhythm Abdomen: soft, non-tender; bowel sounds normal Pelvic: evaluated by RN, CVE 1 thick high Extremities: Homans sign is negative, no sign of DVT DTR's 2+ Presentation: cephalic Fetal monitoringBaseline: 145 bpm, Variability: Good {> 6 bpm), Accelerations: no accels yet, only 10 mins on monitor, and Decelerations: Absent Uterine activityFrequency: Every 4 minutes    Prenatal labs: ABO, Rh: --/--/A POS (07/19 1934) Antibody: NEG (07/19 1934) Rubella: 1.68 (02/23 1344) RPR: Non Reactive (06/23 1225)  HBsAg: Negative (02/23 1344)  HIV: Non Reactive (06/23 1225)  GBS: Negative/--  (08/17 1644)  1 hr Glucola 115 Genetic screening  LR NIPS Anatomy US WNL  Prenatal Transfer Tool  Maternal Diabetes: No Genetic Screening: Normal Maternal Ultrasounds/Referrals: IUGR Fetal Ultrasounds or other Referrals:  None Maternal Substance Abuse:  No Significant Maternal Medications:  None Significant Maternal Lab Results: Group B Strep negative  No results found for this or any previous visit (from the past 24 hour(s)).  Patient Active Problem List   Diagnosis Date Noted   Intrauterine growth restriction (IUGR) affecting care of mother 08/30/2020   IUGR (intrauterine growth restriction) 07/27/2020   Syncope 07/25/2020   Anemia 07/02/2020   Anemia in pregnancy, third trimester 07/02/2020   Encounter for supervision of high risk pregnancy in third trimester, antepartum 04/04/2020   AMA (advanced maternal age) multigravida 35+ 04/04/2020   ASCUS of cervix with negative high risk HPV 09/26/2016   Tobacco abuse 09/19/2016   Abnormal uterine bleeding (AUB) 09/19/2016    Assessment/Plan:  [name] is a 35 y.o. G1P0 at [redacted]w[redacted]d here for IOL d/t IUGR.  #Labor: Will start with cytotec and add FB when able.  #IUGR: 2125g <1st percentile on [place] with MCM two days ago 8/22. Reactive NST, 10/10 BPP, doppler S/D ratio WNL at 2.46.   #HPV: Pap this year NILM with HPV+, repeat in 1 year.  #Pain: Prefers IV pain meds and NO2 over epidural #FWB: Cat I #ID:  GBS neg #MOF: breast #MOC: Nexplanon (has medicaid secondary) #Circ:  N/a  Shirlean Mylar, MD  08/30/2020, 3:27 PM   Midwife attestation: I have seen and examined this patient; I agree with above documentation in the resident's note.   [name] is a 35 y.o. G1P1001 here for IOL for severe FGR <1%.   PE: BP (!) 107/55 (BP Location: Right Arm)   Pulse (!) 53   Temp 98.2 F (36.8 C) (Oral)   Resp 16   Ht 5\' 4"  (1.626 m)   Wt 145 lb 4.8 oz (65.9 kg)   LMP 12/17/2019 (Within Days)   Breastfeeding Unknown    BMI 24.94 kg/m  Gen: calm comfortable, NAD Resp: normal effort, no distress Abd: gravid  ROS, labs, PMH reviewed  Plan: Admit to LD Labor: Cytotec Fetal monitoring: Category I with accels ID: GBS neg  14/10/2019, CNM  08/31/2020, 10:18 AM

## 2020-08-30 NOTE — Telephone Encounter (Signed)
Called patient to let her know she will be called when it is time for her induction. It will hopefully be sometime today or tomorrow. Discussed signs of labor and when she should come in if she has symptoms of labor or bleeding. Patient expressed understanding.  Warner Mccreedy, MD, MPH OB Fellow, Faculty Practice

## 2020-08-30 NOTE — Progress Notes (Signed)
Patient ID: Dominique Webb, female   DOB: 11/11/1985, 35 y.o.   MRN: [id number]  S/p cytotec x 1 dose; feeling a little crampy but overall okay  BP 106/64, P 71 FHR 130-140s, +accels, occ variables, +LTV Ctx irreg 2-6 mins Cx ant 1/thick/vtx -2  IUP@37 .2wks FGR Cx unfavorable  -Cervical foley inserted without difficulty and inflated with 60cc fluid -Second dose of cytotec placed as well -Plan for Pitocin when foley dislodges -Anticipate vag del  Dominique Webb CNM 08/30/2020 9:04 PM

## 2020-08-31 ENCOUNTER — Encounter (HOSPITAL_COMMUNITY): Payer: Self-pay | Admitting: Obstetrics & Gynecology

## 2020-08-31 DIAGNOSIS — O365931 Maternal care for other known or suspected poor fetal growth, third trimester, fetus 1: Secondary | ICD-10-CM

## 2020-08-31 DIAGNOSIS — Z3A37 37 weeks gestation of pregnancy: Secondary | ICD-10-CM

## 2020-08-31 LAB — RPR: RPR Ser Ql: NONREACTIVE

## 2020-08-31 MED ORDER — OXYTOCIN 10 UNIT/ML IJ SOLN: 10.0000 [IU] | Freq: Once | INTRAMUSCULAR | Status: AC

## 2020-08-31 MED ORDER — ONDANSETRON HCL 4 MG PO TABS: 4.0000 mg | ORAL_TABLET | ORAL | Status: DC | PRN

## 2020-08-31 MED ORDER — OXYCODONE HCL 5 MG PO TABS: 5.0000 mg | ORAL_TABLET | ORAL | Status: DC | PRN

## 2020-08-31 MED ORDER — ONDANSETRON HCL 4 MG/2ML IJ SOLN: 4.0000 mg | INTRAMUSCULAR | Status: DC | PRN

## 2020-08-31 MED ORDER — SODIUM CHLORIDE 0.9 % IV SOLN
12.5000 mg | Freq: Four times a day (QID) | INTRAVENOUS | Status: DC | PRN
  Filled 2020-08-31: qty 0.5

## 2020-08-31 MED ORDER — SENNOSIDES-DOCUSATE SODIUM 8.6-50 MG PO TABS
2.0000 | ORAL_TABLET | ORAL | Status: DC
  Administered 2020-09-01: 2 via ORAL
  Filled 2020-08-31 (×2): qty 2

## 2020-08-31 MED ORDER — IBUPROFEN 600 MG PO TABS
600.0000 mg | ORAL_TABLET | Freq: Four times a day (QID) | ORAL | Status: DC
  Administered 2020-08-31 – 2020-09-01 (×5): 600 mg via ORAL
  Filled 2020-08-31 (×7): qty 1

## 2020-08-31 MED ORDER — BENZOCAINE-MENTHOL 20-0.5 % EX AERO: 1.0000 "application " | INHALATION_SPRAY | CUTANEOUS | Status: DC | PRN

## 2020-08-31 MED ORDER — ZOLPIDEM TARTRATE 5 MG PO TABS: 5.0000 mg | ORAL_TABLET | Freq: Every evening | ORAL | Status: DC | PRN

## 2020-08-31 MED ORDER — TETANUS-DIPHTH-ACELL PERTUSSIS 5-2.5-18.5 LF-MCG/0.5 IM SUSY: 0.5000 mL | PREFILLED_SYRINGE | Freq: Once | INTRAMUSCULAR | Status: DC

## 2020-08-31 MED ORDER — DIPHENHYDRAMINE HCL 25 MG PO CAPS: 25.0000 mg | ORAL_CAPSULE | Freq: Four times a day (QID) | ORAL | Status: DC | PRN

## 2020-08-31 MED ORDER — MEASLES, MUMPS & RUBELLA VAC IJ SOLR: 0.5000 mL | Freq: Once | INTRAMUSCULAR | Status: DC

## 2020-08-31 MED ORDER — DIBUCAINE (PERIANAL) 1 % EX OINT: 1.0000 "application " | TOPICAL_OINTMENT | CUTANEOUS | Status: DC | PRN

## 2020-08-31 MED ORDER — WITCH HAZEL-GLYCERIN EX PADS: 1.0000 "application " | MEDICATED_PAD | CUTANEOUS | Status: DC | PRN

## 2020-08-31 MED ORDER — PRENATAL MULTIVITAMIN CH
1.0000 | ORAL_TABLET | Freq: Every day | ORAL | Status: DC
  Administered 2020-08-31 – 2020-09-01 (×2): 1 via ORAL
  Filled 2020-08-31 (×3): qty 1

## 2020-08-31 MED ORDER — SIMETHICONE 80 MG PO CHEW: 80.0000 mg | CHEWABLE_TABLET | ORAL | Status: DC | PRN

## 2020-08-31 MED ORDER — OXYTOCIN 10 UNIT/ML IJ SOLN
INTRAMUSCULAR | Status: AC
  Administered 2020-08-31: 10 [IU] via INTRAMUSCULAR
  Filled 2020-08-31: qty 1

## 2020-08-31 MED ORDER — COCONUT OIL OIL: 1.0000 "application " | TOPICAL_OIL | Status: DC | PRN

## 2020-08-31 MED ORDER — ACETAMINOPHEN 325 MG PO TABS: 650.0000 mg | ORAL_TABLET | ORAL | Status: DC | PRN

## 2020-08-31 NOTE — Progress Notes (Addendum)
Patient ID: Dominique Webb, female   DOB: 1985-02-23, 35 y.o.   MRN: [id number]  S/p cervical foley at 2328; since then has been managed expectantly without Pitocin. At 0300 pt had variable decels lasting total to a nadir of 60 and resolved with fluids and getting on hands and knees; Dr Debroah Loop in room as CNM in a delivery. Variability excellent through out, as well as prior to and after, tracing was reactive. Cx 5-6/80/vtx -1 at that time. At 0352-0400 the variables began again and resolved w hands and knees and terbutaline; reactive tracing with good variability prior and after. SROM shortly after and IFSE placed with exam unchanged. At 0430 variables again began and resolved w hands and knees. No evidence of compromised fetal status. Will continue to monitor and will make a change in plan accordingly, but currently will stay the course for vag delivery by keeping pt in hands and knees/other positions that facilitate a reassuring tracing.  Arabella Merles CNM 08/31/2020 4:51 AM

## 2020-08-31 NOTE — Lactation Note (Addendum)
This note was copied from a baby's chart. Lactation Consultation Note  Patient Name: Dominique Webb [id number] Date: 08/31/2020 Reason for consult: Initial assessment;1st time breastfeeding;Primapara;Infant < 6lbs;Early term 37-38.6wks Age:37 hours  LC in to visit with P1 Mom of ET infant weighing 4 lbs 12.4 oz at birth.  Baby has latched to breast on and off twice.  Baby jittery and first CBG was 47. Last 2 axillary temperatures have been WNL Reviewed breast massage and hand expression, unable to express any colostrum.  Mom reports+ breast changes with this pregnancy. Baby showing subtle cues swaddled in crib.  Offered to assist with positioning and latching.  Assisted Mom to use cross cradle hold to better control and attain a deep latch.  Baby opened her mouth onto breast but too sleepy to feed. Set up DEBP and assisted Mom with first pumping on initiation setting.   Talked to Mom about the need to supplement baby if she is too sleepy or unable to attain a deep latch and feed 10-20 mins with swallows.  Mom choosing donor breast milk (RN aware).  Plan- 1- Keep baby STS as much as possible 2- At least every 3 hrs, sooner if baby cues to eat, offer breast 3- supplement with EBM+/donor breast milk 5-10 ml first 24 hrs.  4- 2nd 24 hrs, increase supplementation to 10-20 ml. 5-Pump both breasts 15 mins, adding breast massage and hand expression to stimulate milk supply.  Mom does not have a DEBP at home.  [facility] referral faxed. Lactation brochure provided.  Mom aware of IP and OP lactation support and encouraged to ask for help prn.  Maternal Data Has patient been taught Hand Expression?: Yes Does the patient have breastfeeding experience prior to this delivery?: No  Feeding Mother's Current Feeding Choice: Breast Milk and Donor Milk  LATCH Score Latch: Too sleepy or reluctant, no latch achieved, no sucking elicited.  Audible Swallowing: None  Type of Nipple: Everted at rest and after  stimulation  Comfort (Breast/Nipple): Soft / non-tender  Hold (Positioning): Full assist, staff holds infant at breast  LATCH Score: 4   Lactation Tools Discussed/Used Tools: Pump;Flanges Flange Size: 24 Breast pump type: Double-Electric Breast Pump Pump Education: Setup, frequency, and cleaning;Milk Storage Reason for Pumping: Support milk supply/ET infant weighing <5lbs at birth Pumping frequency: Encouraged Mom to pump every 2-3 hrs with feedings  Interventions Interventions: Breast feeding basics reviewed;Breast massage;Skin to skin;Assisted with latch;Hand express;Support pillows;DEBP;Education  Discharge WIC Program: Yes  Consult Status Consult Status: Follow-up Date: 09/01/20 Follow-up type: In-patient    Dominique Webb 08/31/2020, 10:40 AM

## 2020-08-31 NOTE — Lactation Note (Signed)
This note was copied from a baby's chart. Lactation Consultation Note  Patient Name: Dominique Webb [id number] Date: 08/31/2020 Reason for consult: L&D Initial assessment;Early term 37-38.6wks Age:37 hours  P1, Baby cueing and bopping on breast upon entering. Assisted with latching off and on. Lactation to follow up on MBU.  Maternal Data Does the patient have breastfeeding experience prior to this delivery?: No   LATCH Score Latch: Grasps breast easily, tongue down, lips flanged, rhythmical sucking. (off and on)  Audible Swallowing: A few with stimulation  Type of Nipple: Everted at rest and after stimulation  Comfort (Breast/Nipple): Soft / non-tender  Hold (Positioning): Assistance needed to correctly position infant at breast and maintain latch.  LATCH Score: 8   Interventions Interventions: Assisted with latch;Skin to skin;Education   Consult Status Consult Status: Follow-up from L&D    Dahlia Byes [facility] 08/31/2020, 7:20 AM

## 2020-08-31 NOTE — Discharge Summary (Addendum)
Postpartum Discharge Summary     Patient Name: Dominique Webb DOB: November 16, 1985 MRN: [id number]  Date of admission: 08/30/2020 Delivery date:08/31/2020  Delivering provider: Serita Grammes D  Date of discharge: 09/02/2020  Admitting diagnosis: Intrauterine growth restriction (IUGR) affecting care of mother [O36.5990] Intrauterine pregnancy: [redacted]w[redacted]d    Secondary diagnosis:  Active Problems:   AMA (advanced maternal age) primigravida 337+ third trimester   Intrauterine growth restriction (IUGR) affecting care of mother  Additional problems: none    Discharge diagnosis: Term Pregnancy Delivered                                              Post partum procedures: none Augmentation: Cytotec and IP Foley Complications: None  Hospital course: Induction of Labor With Vaginal Delivery   35y.o. yo G1P1001 at 348w3das admitted to the hospital 08/30/2020 for induction of labor.  Indication for induction:  FGR .  Patient had an uncomplicated labor course, progressing in to active labor following a cervical foley placement and cytotec x 2 doses. Membrane Rupture Time/Date: 4:11 AM ,08/31/2020   Delivery Method:Vaginal, Spontaneous  Episiotomy: None  Lacerations:  None  Details of delivery can be found in separate delivery note.  Patient had a routine postpartum course. Patient is discharged home 09/02/20.  Newborn Data: Birth date:08/31/2020  Birth time:6:22 AM  Gender:Female  Living status:Living  Apgars:8 ,9  Weight:2166 g (4lb 12.4oz)  Magnesium Sulfate received: No BMZ received: No Rhophylac:N/A MMR:N/A T-DaP:Given prenatally Flu: Yes Transfusion:No  Physical exam  Vitals:   09/01/20 0600 09/01/20 1443 09/01/20 2105 09/02/20 0522  BP: 100/68 106/67 101/65 104/69  Pulse: (!) 54 79 (!) 58 (!) 49  Resp: _0 Temp: 98.1 F (36.7 C) 98.3 F (36.8 C) 98.4 F (36.9 C) 98.4 F (36.9 C)  TempSrc: Oral Oral Oral   SpO2: 100% 99%    Weight:      Height:       General:  alert, cooperative, and no distress Lochia: appropriate Uterine Fundus: firm Incision: N/A DVT Evaluation: No evidence of DVT seen on physical exam. Negative Homan's sign. No cords or calf tenderness. No significant calf/ankle edema. Labs: Lab Results  Component Value Date   WBC 13.4 (H) 08/30/2020   HGB 12.4 08/30/2020   HCT 38.6 08/30/2020   MCV 97.2 08/30/2020   PLT 220 08/30/2020   CMP Latest Ref Rng & Units 07/25/2020  Glucose 70 - 99 mg/dL 79  BUN 6 - 20 mg/dL <5(L)  Creatinine 0.44 - 1.00 mg/dL 0.57  Sodium 135 - 145 mmol/L 136  Potassium 3.5 - 5.1 mmol/L 3.8  Chloride 98 - 111 mmol/L 103  CO2 22 - 32 mmol/L 23  Calcium 8.9 - 10.3 mg/dL 9.5  Total Protein 6.0 - 8.3 g/dL -  Total Bilirubin 0.2 - 1.2 mg/dL -  Alkaline Phos 39 - 117 U/L -  AST 0 - 37 U/L -  ALT 0 - 35 U/L -   Edinburgh Score: Edinburgh Postnatal Depression Scale Screening Tool 09/02/2020  I have been able to laugh and see the funny side of things. 0  I have looked forward with enjoyment to things. 0  I have blamed myself unnecessarily when things went wrong. 0  I have been anxious or worried for no good reason. 0  I have felt scared or  panicky for no good reason. 0  Things have been getting on top of me. 0  I have been so unhappy that I have had difficulty sleeping. 0  I have felt sad or miserable. 0  I have been so unhappy that I have been crying. 0  The thought of harming myself has occurred to me. 0  Edinburgh Postnatal Depression Scale Total 0     After visit meds:  Allergies as of 09/02/2020       Reactions   Aspirin Other (See Comments)   "stomach bleeds" Can take Advil        Medication List     TAKE these medications    ferrous fumarate 325 (106 Fe) MG Tabs tablet Commonly known as: HEMOCYTE - 106 mg FE Take 1 tablet (106 mg of iron total) by mouth every other day.   ibuprofen 600 MG tablet Commonly known as: ADVIL Take 1 tablet (600 mg total) by mouth every 6 (six)  hours.   Prenatal Vitamins 28-0.8 MG Tabs Take 1 tablet by mouth daily.         Discharge home in stable condition Infant Feeding: Breast Infant Disposition:home with mother Discharge instruction: per After Visit Summary and Postpartum booklet. Activity: Advance as tolerated. Pelvic rest for 6 weeks.  Diet: routine diet Future Appointments: Future Appointments  Date Time Provider [place]  09/29/2020 10:55 AM Gabriel Carina, CNM [facility]   Follow up Visit:  Follow-up Information     Gabriel Carina, CNM Follow up on 09/29/2020.   Specialty: Certified Nurse Midwife Why: Please arrive 15 mins early for 10:55 AM appt Contact information: [place] [address] [phone]                Myrtis Ser, CNM  P [facility] Admin Pool Please schedule this patient for Postpartum visit in: 4 weeks with the following provider: Any provider  In-Person  For C/S patients schedule nurse incision check in weeks 2 weeks: no  High risk pregnancy complicated by: FGR  Delivery mode:  SVD  Anticipated Birth Control:  PP Nexplanon placed (this is the plan)  PP Procedures needed: Pap at [place] visit: no    09/02/2020 Fatima Blank, CNM  Midwife attestation I have seen and examined this patient and agree with above documentation in the resident's note.   Dominique Webb is a 34 y.o. G1P1001 s/p vaginal delivery.   Pain is well controlled.  Plan for birth control is  Nexplanon placed inpatient .  Method of Feeding: breast  PE:  BP 104/69 (BP Location: Right Arm)   Pulse (!) 49   Temp 98.4 F (36.9 C)   Resp 18   Ht 5' 4" (1.626 m)   Wt 65.9 kg   LMP 12/17/2019 (Within Days)   SpO2 99%   Breastfeeding Unknown   BMI 24.94 kg/m  Gen: well appearing Heart: reg rate Lungs: normal WOB Fundus firm Ext: soft, no pain, no edema  Recent Labs    08/30/20 1555  HGB 12.4  HCT 38.6     Plan: discharge today -  postpartum care discussed - f/u clinic in 6 weeks for postpartum visit   Fatima Blank, CNM 11:03 AM

## 2020-09-01 ENCOUNTER — Telehealth: Payer: Self-pay | Admitting: *Deleted

## 2020-09-01 ENCOUNTER — Encounter: Payer: BC Managed Care – PPO | Admitting: Family Medicine

## 2020-09-01 DIAGNOSIS — Z30017 Encounter for initial prescription of implantable subdermal contraceptive: Secondary | ICD-10-CM

## 2020-09-01 MED ORDER — LIDOCAINE HCL 1 % IJ SOLN
0.0000 mL | Freq: Once | INTRAMUSCULAR | Status: AC | PRN
  Administered 2020-09-01: 3 mL via INTRADERMAL
  Filled 2020-09-01: qty 20

## 2020-09-01 MED ORDER — ETONOGESTREL 68 MG ~~LOC~~ IMPL
68.0000 mg | DRUG_IMPLANT | Freq: Once | SUBCUTANEOUS | Status: AC
  Administered 2020-09-01: 68 mg via SUBCUTANEOUS
  Filled 2020-09-01: qty 1

## 2020-09-01 NOTE — Procedures (Signed)
Nexplanon Insertion Procedure Note Prior to the procedure being performed, the patient was asked to state their full name, date of birth, type of procedure being performed and the exact location of the operative site. This information was then checked against the documentation in the patient's chart. Prior to the procedure being performed, a "time out" was performed by the physician that confirmed the correct patient, procedure and site.  After informed consent was obtained, the patient's non-dominant left arm's prior insertion site was chosen. The site was approximately 8 cm proximal to the medial epicondyle in the sulcus between the biceps and triceps on the inner surface. The area was cleaned with alcohol then local anesthesia was infiltrated with 3 ml of 1% lidocaine along the planned insertion track. The area was prepped with betadine. Using sterile technique the Nexplanon device was inserted per manufacturer's guidelines in the subdermal connective tissue using the standard insertion technique without difficulty. Pressure was applied and the insertion site was hemostatic. The presence of the Nexplanon was confirmed immediately after insertion by palpation by both me and the patient and by checking the tip of needle for the absence of the insert.  A pressure dressing was applied.  Lot #: [id number] Exp: [id number]  The patient tolerated the procedure well.  Cornelia Copa MD Attending Center for [employer] Midwife)

## 2020-09-01 NOTE — Lactation Note (Signed)
This note was copied from a baby's chart. Lactation Consultation Note  Patient Name: Dominique Webb INOMV'E Date: 09/01/2020 Reason for consult: Follow-up assessment;Mother's request;Difficult latch;1st time breastfeeding;Early term 37-38.6wks Age:37 hours LC entered the room, mom was doing skin to skin with infant. Per mom, she would like to latch infant at her breast, this will be mom's 2nd time breastfeeding infant since birth. Per mom, she has made few attempts and stopped using the DEBP yesterday due to not seeing any colostrum. LC ask mom to do breast stimulation prior to latching infant due to having inverted nipples both breast. Mom latched infant on her left breast using the cross cradle hold, infant latched with depth and sustained latch breastfeeding for 15 minutes. Afterwards due to infant still cuing to breastfeed mom latched infant on her right breast using the football hold position and breastfeed for another 5 minutes.  Infant breastfeed on both breast for 20 minutes afterwards infant was supplemented with 15 mls of donor breast milk using slow flow bottle nipple. LC discussed the importance of using the DEBP to help with breast stimulation and establishing mom's milk supply, explaining the thickness of colostrum.  Mom's plan: 1- Mom will  pre-pump with hand pump prior to latching infant at the breast and breastfeed infant according to feeding cues, 8 to 12+ times within 24 hours, skin to skin. 2- Attempting latch infant on both breast but limiting total feedings to 30 minutes. 3- Mom will offer amy EBM first from using the DEBP and then offer donor breast milk. 4- Mom knows to call RN or LC if she needs further assistance with latching infant at the breast.  Maternal Data    Feeding Mother's Current Feeding Choice: Breast Milk and Donor Milk  LATCH Score Latch: Grasps breast easily, tongue down, lips flanged, rhythmical sucking.  Audible Swallowing: Spontaneous and  intermittent  Type of Nipple: Inverted  Comfort (Breast/Nipple): Soft / non-tender  Hold (Positioning): Assistance needed to correctly position infant at breast and maintain latch.  LATCH Score: 7   Lactation Tools Discussed/Used Tools: Pump Breast pump type: Double-Electric Breast Pump Pump Education: Setup, frequency, and cleaning;Milk Storage Reason for Pumping: To help establish milk supply, infant past hx poor feeder, ETI Pumping frequency: Mom will start pumping every 3 hours for 15 minutes on inital setting, and will use hand pump to pre-pump breast prior to latching infant.  Interventions Interventions: Pre-pump if needed;Breast compression;Adjust position;Support pillows;Position options;Hand pump;Education;DEBP  Discharge    Consult Status Consult Status: Follow-up Date: 09/02/20 Follow-up type: In-patient    Danelle Earthly 09/01/2020, 4:24 PM

## 2020-09-01 NOTE — Progress Notes (Signed)
Post Partum Day 1 Subjective: no complaints, up ad lib, voiding and tolerating PO, small lochia, plans to breastfeed,  wants inpt nexplanon  Objective: Blood pressure 100/68, pulse (!) 54, temperature 98.1 F (36.7 C), temperature source Oral, resp. rate 17, height 5\' 4"  (1.626 m), weight 65.9 kg, last menstrual period 12/17/2019, SpO2 100 %, unknown if currently breastfeeding.  Physical Exam:  General: alert, cooperative and no distress Lochia:normal flow Chest: CTAB Heart: RRR no m/r/g Abdomen: +BS, soft, nontender,  Uterine Fundus: firm DVT Evaluation: No evidence of DVT seen on physical exam. Extremities: trace edema  Recent Labs    08/30/20 1555  HGB 12.4  HCT 38.6    Assessment/Plan: Plan for discharge tomorrow and Contraception plan nexplanon insertion   LOS: 2 days   09/01/20 09/01/2020, 7:49 AM

## 2020-09-01 NOTE — Telephone Encounter (Signed)
Received a voice message from yesterday 08/31/20 pm stating she is Elease Hashimoto from [employer] and is calling to verify we received medication forms they faxed 08/24/20 for disability paperwork. Would like a call back at [phone]. Will forward to appropriate person.  Masiel Gentzler,RN

## 2020-09-02 ENCOUNTER — Ambulatory Visit: Payer: Self-pay

## 2020-09-02 MED ORDER — IBUPROFEN 600 MG PO TABS
600.0000 mg | ORAL_TABLET | Freq: Four times a day (QID) | ORAL | 0 refills | Status: DC
Start: 2020-09-02 — End: 2020-11-14

## 2020-09-02 NOTE — Lactation Note (Signed)
This note was copied from a baby's chart. Lactation Consultation Note  Patient Name: Dominique Webb CWUGQ'B Date: 09/02/2020 Reason for consult: Follow-up assessment;Difficult latch;1st time breastfeeding;Early term 37-38.6wks;Infant weight loss;Other (Comment);Infant < 6lbs (4 % weight loss / less that 5 pounds/see LC note for details of D/C plan) Age:37 hours Per mom the baby resent feeding was 20  ml of donor milk. Mom mentioned the most the baby has taken at a feeding is 20 ml .  LC mentioned due to the baby's age to increase calories and volume to at least 30 ml by tonight.  Mom active with [facility] and has a appt at 2:30 pm Monday to pick up her DEBP . LC offered a [facility] loaner and mom declined . LC provided another hand pump.  Mom mentioned her sister can help her.  LC reviewed BF D/C teaching - see below.   Maternal Data Has patient been taught Hand Expression?: Yes  Feeding Mother's Current Feeding Choice: Breast Milk and Donor Milk Nipple Type: Extra Slow Flow  LATCH Score   Lactation Tools Discussed/Used Tools: Pump;Flanges Flange Size: 24 Breast pump type: Double-Electric Breast Pump;Manual Pump Education: Milk Storage  Interventions Interventions: Breast feeding basics reviewed;Education  Discharge Discharge Education: Engorgement and breast care;Warning signs for feeding baby Pump: Manual (LC provided a 2nd hand pump so mom can keep up her pumping and per mom plans to have her sister help her.) [facility] Program: Yes (per mom has a WIC appt on Monday at 2:3:30 p to pick)  Consult Status Consult Status: Complete Date: 09/02/20    Matilde Sprang Nikita Surman 09/02/2020, 3:12 PM

## 2020-09-04 LAB — SURGICAL PATHOLOGY

## 2020-09-05 NOTE — Telephone Encounter (Signed)
Return call to Dominique Webb- LOA forms were not received. Delivery information provided and will refax forms with update to include delivery information.  See scanned copy and ROI.

## 2020-09-13 ENCOUNTER — Telehealth (HOSPITAL_COMMUNITY): Payer: Self-pay | Admitting: *Deleted

## 2020-09-13 NOTE — Telephone Encounter (Signed)
Mom reports feeling good. No concerns about herself. EPDS=3 (hospital score=0) Mom reports baby is fine. Feeding, peeing and pooping without difficulty. Sleeps in bassinet on back. No concerns about baby.  Duffy Rhody, RN 09-13-2020 at 2:50pm

## 2020-09-29 ENCOUNTER — Ambulatory Visit (INDEPENDENT_AMBULATORY_CARE_PROVIDER_SITE_OTHER): Payer: BC Managed Care – PPO | Admitting: Certified Nurse Midwife

## 2020-09-29 ENCOUNTER — Other Ambulatory Visit: Payer: Self-pay

## 2020-09-29 ENCOUNTER — Encounter: Payer: Self-pay | Admitting: Certified Nurse Midwife

## 2020-09-29 NOTE — Progress Notes (Signed)
Post Partum Visit Note  Dominique Webb is a 35 y.o. G60P1001 female who presents for a postpartum visit. She is 4 weeks postpartum following a normal spontaneous vaginal delivery.  I have fully reviewed the prenatal and intrapartum course. The delivery was at [redacted]w[redacted]d gestational weeks.  Anesthesia: none. Postpartum course has been good and uncomplicated. Baby is doing well. Baby is feeding by bottle - Similac Neosure. Bleeding no bleeding. Bowel function is normal. Bladder function is normal. Patient is not sexually active. Contraception method is Nexplanon. Postpartum depression screening: negative.   Upstream - 09/29/20 1534       Pregnancy Intention Screening   Does the patient want to become pregnant in the next year? No    Does the patient's partner want to become pregnant in the next year? No    Would the patient like to discuss contraceptive options today? N/A   Nexplanon in place           The pregnancy intention screening data noted above was reviewed. Potential methods of contraception were discussed. The patient elected to proceed with No data recorded.   Edinburgh Postnatal Depression Scale - 09/29/20 1125       Edinburgh Postnatal Depression Scale:  In the Past 7 Days   I have been able to laugh and see the funny side of things. 0    I have looked forward with enjoyment to things. 0    I have blamed myself unnecessarily when things went wrong. 0    I have been anxious or worried for no good reason. 0    I have felt scared or panicky for no good reason. 0    Things have been getting on top of me. 0    I have been so unhappy that I have had difficulty sleeping. 0    I have felt sad or miserable. 0    I have been so unhappy that I have been crying. 0    The thought of harming myself has occurred to me. 0    Edinburgh Postnatal Depression Scale Total 0             Health Maintenance Due  Topic Date Due   INFLUENZA VACCINE  08/07/2020    The following  portions of the patient's history were reviewed and updated as appropriate: allergies, current medications, past family history, past medical history, past social history, past surgical history, and problem list.  Review of Systems Pertinent items noted in HPI and remainder of comprehensive ROS otherwise negative.  Objective:  BP 111/60   Pulse (!) 52   Wt 133 lb (60.3 kg)   Breastfeeding No   BMI 22.83 kg/m    General:  alert, cooperative, appears stated age, and no distress   Breasts:  normal  Lungs: Normal effort  Heart:  regular rate and rhythm  Abdomen: Soft, nontender    Wound N/A  GU exam:  not indicated       Assessment:  Postpartum care and examination  Plan:   Essential components of care per ACOG recommendations:  1.  Mood and well being: Patient with negative depression screening today. Reviewed local resources for support.  - Patient tobacco use? No.   - hx of drug use? No.    2. Infant care and feeding:  -Patient currently breastmilk feeding? No.  -Social determinants of health (SDOH) reviewed in EPIC. No concerns   3. Sexuality, contraception and birth spacing - Patient does not want a  pregnancy in the next year.  Desired family size is 1 children.  - Patient has Nexplanon in place  4. Sleep and fatigue -Encouraged family/partner/community support of 4 hrs of uninterrupted sleep to help with mood and fatigue  5. Physical Recovery  - Discussed patients delivery and complications. She describes her labor as good. - Patient had a Vaginal, no problems at delivery. Patient had  no  laceration. Perineal healing reviewed. Patient expressed understanding - Patient has urinary incontinence? No. - Patient is safe to resume physical and sexual activity  6.  Health Maintenance - HM due items addressed Yes - Last pap smear  Diagnosis  Date Value Ref Range Status  03/01/2020   Final   - Negative for intraepithelial lesion or malignancy (NILM)   Pap smear not  done at today's visit, but needs follow up in February for repeat pap. -Breast Cancer screening indicated? No.   7. Chronic Disease/Pregnancy Condition follow up: None - PCP follow up as needed  Bernerd Limbo, CNM [facility] [employer], [facility]

## 2020-10-05 ENCOUNTER — Telehealth: Payer: Self-pay | Admitting: *Deleted

## 2020-10-05 NOTE — Telephone Encounter (Signed)
Voice mail from patient requesting forms be faxed covering absences prior to delivery.  Call to patient- advised forms have been completed and sent- as previously reviewed with patient and leave date starting on 08-11-20 through 10-12-20. Bedrest was initiated on 08-11-20 for IUGR. Then delivery on 08-31-20 and need six week recovery.   Patient to contact leave administrator to confirm dates.

## 2020-11-14 ENCOUNTER — Other Ambulatory Visit: Payer: Self-pay

## 2020-11-14 ENCOUNTER — Encounter: Payer: Self-pay | Admitting: Internal Medicine

## 2020-11-14 ENCOUNTER — Telehealth (INDEPENDENT_AMBULATORY_CARE_PROVIDER_SITE_OTHER): Payer: BC Managed Care – PPO | Admitting: Internal Medicine

## 2020-11-14 DIAGNOSIS — J069 Acute upper respiratory infection, unspecified: Secondary | ICD-10-CM

## 2020-11-14 NOTE — Progress Notes (Signed)
   Subjective:    Patient ID: Dominique Webb, female    DOB: 23-Dec-1985, 35 y.o.   MRN: [id number]  DOS:  11/14/2020 Type of visit - description: Virtual Visit via Video Note  I connected with the above patient  by a video enabled telemedicine application and verified that I am speaking with the correct person using two identifiers.   THIS ENCOUNTER IS A VIRTUAL VISIT DUE TO COVID-19 - PATIENT WAS NOT SEEN IN THE OFFICE. PATIENT HAS CONSENTED TO VIRTUAL VISIT / TELEMEDICINE VISIT   Location of patient: home  Location of provider: office  Persons participating in the virtual visit: patient, provider   I discussed the limitations of evaluation and management by telemedicine and the availability of in person appointments. The patient expressed understanding and agreed to proceed.  Acute Symptoms a started 3 days ago: Sneezing, cough with yellowish sputum initially, now dry. Itchy throat. Her mother had similar symptoms, she tested negative for COVID or influenza, was  Dx with bronchitis.  Denies any fever chills No myalgias She has an nausea and vomiting yesterday x1 but no GI symptoms today No chest pain no difficulty breathing    Review of Systems See above   Past Medical History:  Diagnosis Date   Medical history non-contributory     Past Surgical History:  Procedure Laterality Date   WISDOM TOOTH EXTRACTION      Allergies as of 11/14/2020       Reactions   Aspirin Other (See Comments)   "stomach bleeds" Can take Advil        Medication List        Accurate as of November 14, 2020  4:53 PM. If you have any questions, ask your nurse or doctor.          STOP taking these medications    ibuprofen 600 MG tablet Commonly known as: ADVIL Stopped by: Willow Ora, MD       TAKE these medications    ferrous fumarate 325 (106 Fe) MG Tabs tablet Commonly known as: HEMOCYTE - 106 mg FE Take 1 tablet (106 mg of iron total) by mouth every other day.    Prenatal Vitamins 28-0.8 MG Tabs Take 1 tablet by mouth daily.           Objective:   Physical Exam LMP 11/14/2020  This is a virtual video visit, she is alert oriented x3, speaking in complete sentences.  No vital signs are available today for my review    Assessment      35 year old female, has a Nexplanon in place, presents with: URI: Symptoms a started 3 days ago, possibly URI, she had a COVID test done and results are pending. Plan: Recommend rest, fluids, Tylenol, OTCs for cough. Call if COVID test is positive. If negative will treat conservatively and she will call if not gradually better. She verbalized understanding    I discussed the assessment and treatment plan with the patient. The patient was provided an opportunity to ask questions and all were answered. The patient agreed with the plan and demonstrated an understanding of the instructions.   The patient was advised to call back or seek an in-person evaluation if the symptoms worsen or if the condition fails to improve as anticipated.

## 2020-11-15 ENCOUNTER — Telehealth: Payer: Self-pay | Admitting: *Deleted

## 2020-11-15 NOTE — Telephone Encounter (Signed)
Patient notified

## 2020-11-15 NOTE — Telephone Encounter (Signed)
Patient had video visit with you yesterday and called with results.  She was positive for Flu A and negative for covid.

## 2020-11-15 NOTE — Telephone Encounter (Signed)
Symptoms started 4 days ago, if she is now flu positive needs  conservative treatment only: Rest, fluids, Tylenol, Mucinex DM. Call if not gradually better.

## 2020-11-15 NOTE — Telephone Encounter (Signed)
Who Is Calling Patient / Member / Family / Caregiver Caller Name Remas Sobel Caller Phone Number [phone] Patient Name Labresha Mellor Patient DOB 08-26-1985 Call Type Message Only Information Provided Reason for Call Request for General Office Information Initial Comment Caller States she had an appt yesterday when she received her Covid test results. Additional Comment Test flu A came back positive and negative for Flu B.  Covid test was negative. Disp. Time Disposition Final User 11/15/2020 12:46:17 PM General Information Provided Yes Mcquiter, Brionn

## 2020-12-05 ENCOUNTER — Encounter: Payer: Self-pay | Admitting: Family Medicine

## 2021-02-26 ENCOUNTER — Ambulatory Visit (HOSPITAL_COMMUNITY)
Admission: EM | Admit: 2021-02-26 | Discharge: 2021-02-26 | Disposition: A | Discharge status: Home / Self Care | Payer: BC Managed Care – PPO | Attending: Physician Assistant | Admitting: Physician Assistant

## 2021-02-26 ENCOUNTER — Other Ambulatory Visit: Payer: Self-pay

## 2021-02-26 ENCOUNTER — Encounter (HOSPITAL_COMMUNITY): Payer: Self-pay

## 2021-02-26 DIAGNOSIS — J069 Acute upper respiratory infection, unspecified: Secondary | ICD-10-CM | POA: Insufficient documentation

## 2021-02-26 DIAGNOSIS — F17210 Nicotine dependence, cigarettes, uncomplicated: Secondary | ICD-10-CM | POA: Diagnosis not present

## 2021-02-26 DIAGNOSIS — Z20822 Contact with and (suspected) exposure to covid-19: Secondary | ICD-10-CM | POA: Insufficient documentation

## 2021-02-26 DIAGNOSIS — R051 Acute cough: Secondary | ICD-10-CM | POA: Insufficient documentation

## 2021-02-26 LAB — POC INFLUENZA A AND B ANTIGEN (URGENT CARE ONLY)
INFLUENZA A ANTIGEN, POC: NEGATIVE
INFLUENZA B ANTIGEN, POC: NEGATIVE

## 2021-02-26 MED ORDER — PROMETHAZINE-DM 6.25-15 MG/5ML PO SYRP: 5.0000 mL | ORAL_SOLUTION | Freq: Three times a day (TID) | ORAL | 0 refills | Status: DC | PRN

## 2021-02-26 MED ORDER — PREDNISONE 20 MG PO TABS: 40.0000 mg | ORAL_TABLET | Freq: Every day | ORAL | 0 refills | Status: AC

## 2021-02-26 NOTE — Discharge Instructions (Signed)
Your flu test was negative.  We will contact you if your COVID test is positive.  Please monitor your MyChart for these results.  Start prednisone to help with your symptoms.  You should not take NSAIDs with this medication including aspirin, ibuprofen/Advil, naproxen/Aleve.  You can use Tylenol, Mucinex, Flonase for symptom relief.  Use Promethazine DM for cough.  This can make you sleepy so do not drive or drink alcohol with taking it.  Make sure to rest and drink plenty of fluid.  If your symptoms not improving by next week please return here or see your PCP.  If anything worsens and you develop high fever not respond to medication, chest pain, shortness of breath, worsening cough you need to be seen immediately.

## 2021-02-26 NOTE — ED Provider Notes (Signed)
[place]    CSN: [id number] Arrival date & time: 02/26/21  1341      History   Chief Complaint Chief Complaint  Patient presents with   Cough   sneezing    HPI Dominique Webb is a 36 y.o. female.   Patient presents today with a 2-day history of URI symptoms.  Reports headache, cough, chills, nasal congestion, body aches.  Denies any chest pain, shortness of breath, nausea, vomiting.  She denies any known sick contacts.  She is up-to-date on COVID and influenza vaccines.  She denies any history of asthma, allergies, COPD.  She does smoke but is trying to quit.  She is confident she is not pregnant.  She denies any recent antibiotic use.  She has tried Tylenol cold and flu without improvement of symptoms.   Past Medical History:  Diagnosis Date   Medical history non-contributory     Patient Active Problem List   Diagnosis Date Noted   Nexplanon in place 09/19/2016    Past Surgical History:  Procedure Laterality Date   WISDOM TOOTH EXTRACTION      OB History     Gravida  1   Para  1   Term  1   Preterm      AB      Living  1      SAB      IAB      Ectopic      Multiple  0   Live Births  1            Home Medications    Prior to Admission medications   Medication Sig Start Date End Date Taking? Authorizing Provider  predniSONE (DELTASONE) 20 MG tablet Take 2 tablets (40 mg total) by mouth daily for 3 days. 02/26/21 03/01/21 Yes Jasilyn Holderman K, PA-C  promethazine-dextromethorphan (PROMETHAZINE-DM) 6.25-15 MG/5ML syrup Take 5 mLs by mouth 3 (three) times daily as needed for cough. 02/26/21  Yes Maridel Pixler K, PA-C  ferrous fumarate (HEMOCYTE - 106 MG FE) 325 (106 Fe) MG TABS tablet Take 1 tablet (106 mg of iron total) by mouth every other day. 07/02/20   Starr Lake, CNM  Prenatal Vit-Fe Fumarate-FA (PRENATAL VITAMINS) 28-0.8 MG TABS Take 1 tablet by mouth daily. 04/10/20   Tresea Mall, CNM    Family  History Family History  Problem Relation Age of Onset   Diabetes Mother    Hypertension Mother    Hyperlipidemia Mother    Stroke Father    Hypertension Father     Social History Social History   Tobacco Use   Smoking status: Every Day    Packs/day: 0.50    Years: 16.00    Pack years: 8.00    Types: Cigarettes    Start date: 2004    Last attempt to quit: 04/07/2020    Years since quitting: 0.8   Smokeless tobacco: Never   Tobacco comments:    pt smokes some days - quit with pregnancy  Vaping Use   Vaping Use: Never used  Substance Use Topics   Alcohol use: Not Currently    Comment: Social   Drug use: No     Allergies   Aspirin   Review of Systems Review of Systems  Constitutional:  Positive for activity change. Negative for appetite change, fatigue and fever.  HENT:  Positive for congestion and sore throat. Negative for sinus pressure and sneezing.   Respiratory:  Positive for cough. Negative for  shortness of breath.   Cardiovascular:  Negative for chest pain.  Gastrointestinal:  Negative for abdominal pain, diarrhea, nausea and vomiting.  Musculoskeletal:  Positive for arthralgias and myalgias.  Neurological:  Positive for headaches. Negative for dizziness and light-headedness.    Physical Exam Triage Vital Signs ED Triage Vitals  Enc Vitals Group     BP 02/26/21 1453 124/66     Pulse Rate 02/26/21 1453 68     Resp 02/26/21 1453 17     Temp 02/26/21 1453 99.3 F (37.4 C)     Temp Source 02/26/21 1453 Oral     SpO2 02/26/21 1453 100 %     Weight --      Height --      Head Circumference --      Peak Flow --      Pain Score 02/26/21 1452 4     Pain Loc --      Pain Edu? --      Excl. in [place]? --    No data found.  Updated Vital Signs BP 124/66 (BP Location: Left Arm)    Pulse 68    Temp 99.3 F (37.4 C) (Oral)    Resp 17    LMP 12/11/2020 (Exact Date)    SpO2 100%   Visual Acuity Right Eye Distance:   Left Eye Distance:   Bilateral Distance:     Right Eye Near:   Left Eye Near:    Bilateral Near:     Physical Exam Vitals reviewed.  Constitutional:      General: She is awake. She is not in acute distress.    Appearance: Normal appearance. She is well-developed. She is not ill-appearing.     Comments: Very pleasant female appears stated age in no acute distress sitting comfortably in exam room  HENT:     Head: Normocephalic and atraumatic.     Right Ear: Tympanic membrane, ear canal and external ear normal. Tympanic membrane is not erythematous or bulging.     Left Ear: Tympanic membrane, ear canal and external ear normal. Tympanic membrane is not erythematous or bulging.     Nose:     Right Sinus: No maxillary sinus tenderness or frontal sinus tenderness.     Left Sinus: No maxillary sinus tenderness or frontal sinus tenderness.     Mouth/Throat:     Pharynx: Uvula midline. Posterior oropharyngeal erythema present. No oropharyngeal exudate.  Cardiovascular:     Rate and Rhythm: Normal rate and regular rhythm.     Heart sounds: Normal heart sounds, S1 normal and S2 normal. No murmur heard. Pulmonary:     Effort: Pulmonary effort is normal.     Breath sounds: Normal breath sounds. No wheezing, rhonchi or rales.     Comments: Clear auscultation bilaterally Psychiatric:        Behavior: Behavior is cooperative.     UC Treatments / Results  Labs (all labs ordered are listed, but only abnormal results are displayed) Labs Reviewed  SARS CORONAVIRUS 2 (TAT 6-24 HRS)  POC INFLUENZA A AND B ANTIGEN (URGENT CARE ONLY)    EKG   Radiology No results found.  Procedures Procedures (including critical care time)  Medications Ordered in UC Medications - No data to display  Initial Impression / Assessment and Plan / UC Course  I have reviewed the triage vital signs and the nursing notes.  Pertinent labs & imaging results that were available during my care of the patient were reviewed by me and  considered in my medical  decision making (see chart for details).     Patient is well-appearing, nontoxic, afebrile, nontachycardic.  Discussed likely viral etiology.  No evidence of acute infection on physical exam that would warrant initiation of antibiotics.  Flu testing was negative.  COVID test is pending.  She was provided work excuse note with current CDC return to work guidelines based on COVID test result.  She was started on prednisone burst to help with symptoms with instruction not to take NSAIDs with this medication.  She was given Promethazine DM for cough with instruction not to drive drink alcohol while taking this medication.  She can use Mucinex, Flonase, Tylenol for symptom relief.  If symptoms or not improving by next week she should return here or see her PCP.  If anything worsens and she has severe cough, chest pain, shortness of breath, fever not responding to medication, weakness she needs to go to the emergency room.  Strict return precautions given to which she expressed understanding.  Final Clinical Impressions(s) / UC Diagnoses   Final diagnoses:  Viral URI with cough  Acute cough     Discharge Instructions      Your flu test was negative.  We will contact you if your COVID test is positive.  Please monitor your MyChart for these results.  Start prednisone to help with your symptoms.  You should not take NSAIDs with this medication including aspirin, ibuprofen/Advil, naproxen/Aleve.  You can use Tylenol, Mucinex, Flonase for symptom relief.  Use Promethazine DM for cough.  This can make you sleepy so do not drive or drink alcohol with taking it.  Make sure to rest and drink plenty of fluid.  If your symptoms not improving by next week please return here or see your PCP.  If anything worsens and you develop high fever not respond to medication, chest pain, shortness of breath, worsening cough you need to be seen immediately.     ED Prescriptions     Medication Sig Dispense Auth. Provider    promethazine-dextromethorphan (PROMETHAZINE-DM) 6.25-15 MG/5ML syrup Take 5 mLs by mouth 3 (three) times daily as needed for cough. 118 mL Brynlie Daza K, PA-C   predniSONE (DELTASONE) 20 MG tablet Take 2 tablets (40 mg total) by mouth daily for 3 days. 6 tablet Alok Minshall, Derry Skill, PA-C      PDMP not reviewed this encounter.   Terrilee Croak, PA-C 02/26/21 1534

## 2021-02-26 NOTE — ED Triage Notes (Signed)
Pt reports headaches, cough, chills, sweats X 2 days.

## 2021-02-27 LAB — SARS CORONAVIRUS 2 (TAT 6-24 HRS): SARS Coronavirus 2: NEGATIVE

## 2021-07-17 ENCOUNTER — Other Ambulatory Visit: Payer: Self-pay

## 2021-07-18 ENCOUNTER — Ambulatory Visit
Admission: EM | Admit: 2021-07-18 | Discharge: 2021-07-18 | Disposition: A | Discharge status: Home / Self Care | Payer: BC Managed Care – PPO | Attending: Emergency Medicine | Admitting: Emergency Medicine

## 2021-07-18 DIAGNOSIS — M778 Other enthesopathies, not elsewhere classified: Secondary | ICD-10-CM

## 2021-07-18 MED ORDER — IBUPROFEN 800 MG PO TABS: 800.0000 mg | ORAL_TABLET | Freq: Three times a day (TID) | ORAL | 0 refills | Status: DC

## 2021-07-18 NOTE — Discharge Instructions (Addendum)
The shoulder tendinitis is wrapped likely rib related to repetitive movements at work.  Activity for the next couple of days and use shoulder immobilizer ice heavily more than 10 times a day for that first 48 hours after 48 hours start gentle range of motion exercises as discussed, and she thing the sort of walking the fingers up the wall and opening up the shoulder girdle.  Follow-up with Ortho if not improving physical therapy may be helpful or further diagnostics may be needed.

## 2021-07-18 NOTE — ED Triage Notes (Signed)
Pt c/o RUE pain x 1 month. States it starts in her shoulder and goes down to her finger and feels numb intermittently at first but now it is constant. States it feels better when she lays flat on bed and lifts arm over her head.

## 2021-07-18 NOTE — ED Provider Notes (Signed)
[facility]   MRN: [id number] DOB: Feb 01, 1985  Subjective:   Chief Complaint;  Chief Complaint  Patient presents with   RUE problem    Dominique Webb is a 36 y.o. female presenting for right shoulder pain worsening over the last couple of days.  Patient does work doing repetitive movements at work.  Pain is now radiating from the shoulder down into the right upper arm.  She denies tingling or numbness or weakness.  No current facility-administered medications for this encounter.  Current Outpatient Medications:    ibuprofen (ADVIL) 800 MG tablet, Take 1 tablet (800 mg total) by mouth 3 (three) times daily., Disp: 21 tablet, Rfl: 0   ferrous fumarate (HEMOCYTE - 106 MG FE) 325 (106 Fe) MG TABS tablet, Take 1 tablet (106 mg of iron total) by mouth every other day., Disp: 30 tablet, Rfl: 0   Prenatal Vit-Fe Fumarate-FA (PRENATAL VITAMINS) 28-0.8 MG TABS, Take 1 tablet by mouth daily., Disp: 30 tablet, Rfl: 12   promethazine-dextromethorphan (PROMETHAZINE-DM) 6.25-15 MG/5ML syrup, Take 5 mLs by mouth 3 (three) times daily as needed for cough., Disp: 118 mL, Rfl: 0   Allergies  Allergen Reactions   Aspirin Other (See Comments)    "stomach bleeds" Can take Advil    Past Medical History:  Diagnosis Date   Medical history non-contributory      Review of Systems  All other systems reviewed and are negative.    Objective:   Vitals: BP 109/68 (BP Location: Left Arm)   Pulse 80   Temp 98 F (36.7 C) (Oral)   Resp 18   SpO2 98%   Breastfeeding No   Physical Exam Vitals and nursing note reviewed.  Constitutional:      General: She is not in acute distress.    Appearance: Normal appearance. She is normal weight. She is not ill-appearing or toxic-appearing.  HENT:     Head: Normocephalic and atraumatic.     Right Ear: External ear normal.     Left Ear: External ear normal.  Eyes:     Conjunctiva/sclera: Conjunctivae normal.  Pulmonary:     Effort:  Pulmonary effort is normal.  Musculoskeletal:     Comments: Right shoulder with tenderness posterior aspect no deformity or bruising.  Limited range of motion for full supination or complete abduction.  No NVD D no gross swelling  Skin:    General: Skin is warm.     Findings: No bruising.  Neurological:     Mental Status: She is alert.     Sensory: No sensory deficit.  Psychiatric:        Mood and Affect: Mood normal.        Behavior: Behavior normal.     No results found for this or any previous visit (from the past 24 hour(s)).  No results found.     Assessment and Plan :   1. Shoulder tendonitis, right     Meds ordered this encounter  Medications   ibuprofen (ADVIL) 800 MG tablet    Sig: Take 1 tablet (800 mg total) by mouth 3 (three) times daily.    Dispense:  21 tablet    Refill:  0    Order Specific Question:   Supervising Provider    Answer:   Merrilee Jansky [[id number]]    MDM:  Dominique Webb is a 36 y.o. female presenting for right shoulder pain.  On exam patient has posterior shoulder tenderness and pain on range  of motion consistent with right shoulder tendinitis.  Patient was prescribed Motrin to be taken with Tylenol Extra Strength every 6-8 hours she will also ice heavily shoulder was immobilized with splint.  Instructions are given for gentle range of motion exercises to be started already 8 hours after rest and icing.  Work note is provided.  Patient is agreeable to plan.  I discussed todays findings, treatment plan, follow up and return instructions. Questions were answered. Patient/representative stated understanding of the instructions and patient is stable for discharge.  Dewaine Conger FNP-C MSN     Jone Baseman, NP 07/18/21 1909

## 2021-07-26 ENCOUNTER — Emergency Department (HOSPITAL_COMMUNITY): Payer: BC Managed Care – PPO

## 2021-07-26 ENCOUNTER — Other Ambulatory Visit: Payer: Self-pay

## 2021-07-26 ENCOUNTER — Encounter (HOSPITAL_COMMUNITY): Payer: Self-pay

## 2021-07-26 ENCOUNTER — Emergency Department (HOSPITAL_COMMUNITY)
Admission: EM | Admit: 2021-07-26 | Discharge: 2021-07-26 | Disposition: A | Discharge status: Home / Self Care | Payer: BC Managed Care – PPO | Attending: Emergency Medicine | Admitting: Emergency Medicine

## 2021-07-26 DIAGNOSIS — M19011 Primary osteoarthritis, right shoulder: Secondary | ICD-10-CM | POA: Diagnosis not present

## 2021-07-26 DIAGNOSIS — M25511 Pain in right shoulder: Secondary | ICD-10-CM | POA: Diagnosis not present

## 2021-07-26 DIAGNOSIS — Y92410 Unspecified street and highway as the place of occurrence of the external cause: Secondary | ICD-10-CM | POA: Diagnosis not present

## 2021-07-26 DIAGNOSIS — X500XXA Overexertion from strenuous movement or load, initial encounter: Secondary | ICD-10-CM | POA: Diagnosis not present

## 2021-07-26 DIAGNOSIS — M12811 Other specific arthropathies, not elsewhere classified, right shoulder: Secondary | ICD-10-CM | POA: Insufficient documentation

## 2021-07-26 DIAGNOSIS — M79601 Pain in right arm: Secondary | ICD-10-CM | POA: Diagnosis present

## 2021-07-26 MED ORDER — CYCLOBENZAPRINE HCL 5 MG PO TABS: 5.0000 mg | ORAL_TABLET | Freq: Three times a day (TID) | ORAL | 0 refills | Status: DC | PRN

## 2021-07-26 NOTE — ED Notes (Addendum)
EDP bedside, pt c/o right arm pain that increases when pt attempts to raise arm.  Good pulses.  Pt alert and oriented at this time.  Has a job w/ heavy lifting.

## 2021-07-26 NOTE — Discharge Instructions (Signed)
You likely have rotator cuff injury.  I recommend that you do not pick up anything heavy for this week  Please continue taking Motrin for pain and added Flexeril for spasms  You need to call Ortho for follow-up.  If you have persistent pain, you may need MRI  Return to ER if you have worse shoulder pain, arm numbness or weakness

## 2021-07-26 NOTE — ED Provider Triage Note (Signed)
Emergency Medicine Provider Triage Evaluation Note  Dominique Webb , a 36 y.o. female  was evaluated in triage.  Pt complains of right shoulder pain ongoing for about 1.5 months.  Patient works at [employer].  States her pain worsened today while she was at work.  Was recently seen at urgent care and given ibuprofen.  She reports no relief with this.  Denies any injury.  Denies neck pain.  Review of Systems  Positive: As above Negative: As above  Physical Exam  BP 111/74 (BP Location: Left Arm)   Pulse 74   Temp 98.2 F (36.8 C) (Oral)   Resp 17   SpO2 100%  Gen:   Awake, no distress   Resp:  Normal effort  MSK:   Moves extremities without difficulty  Other:  Full range of motion in the right upper extremity.  Sensation intact.  2+ radial pulse present.  Tenderness to palpation of the right shoulder present.  Cervical spine without tenderness to palpation.  Medical Decision Making  Medically screening exam initiated at 1:11 PM.  Appropriate orders placed.  Lucky Rathke Ballin was informed that the remainder of the evaluation will be completed by another provider, this initial triage assessment does not replace that evaluation, and the importance of remaining in the ED until their evaluation is complete.     Marita [place], PA-C 07/26/21 1313

## 2021-07-26 NOTE — ED Provider Notes (Signed)
[facility] EMERGENCY DEPARTMENT Provider Note   CSN: [id number] Arrival date & time: 07/26/21  1254     History  Chief Complaint  Patient presents with   Rt Arm Pain    Dominique Webb is a 36 y.o. female here presenting with right arm pain.  Patient works in [employer] and picks up heavy items all the time.  Patient states that she has been having pain when she lifts her her arm.  Denies any trauma or injury.  She went to urgent care several days ago and no imaging study was done.  She was thought to have shoulder tendinosis and was put on ibuprofen which did not help with her pain.  Patient has not seen orthopedic surgeon yet.  The history is provided by the patient.       Home Medications Prior to Admission medications   Medication Sig Start Date End Date Taking? Authorizing Provider  ferrous fumarate (HEMOCYTE - 106 MG FE) 325 (106 Fe) MG TABS tablet Take 1 tablet (106 mg of iron total) by mouth every other day. 07/02/20   Marylene Land, CNM  ibuprofen (ADVIL) 800 MG tablet Take 1 tablet (800 mg total) by mouth 3 (three) times daily. 07/18/21   Jone Baseman, NP  Prenatal Vit-Fe Fumarate-FA (PRENATAL VITAMINS) 28-0.8 MG TABS Take 1 tablet by mouth daily. 04/10/20   Thressa Sheller D, CNM  promethazine-dextromethorphan (PROMETHAZINE-DM) 6.25-15 MG/5ML syrup Take 5 mLs by mouth 3 (three) times daily as needed for cough. 02/26/21   Raspet, Noberto Retort, PA-C      Allergies    Aspirin    Review of Systems   Review of Systems  Musculoskeletal:        Shoulder pain   All other systems reviewed and are negative.   Physical Exam Updated Vital Signs BP (!) 106/55 (BP Location: Left Arm)   Pulse (!) 55   Temp 98.3 F (36.8 C) (Oral)   Resp 16   SpO2 100%  Physical Exam Vitals and nursing note reviewed.  Constitutional:      Appearance: Normal appearance.  HENT:     Head: Normocephalic.     Nose: Nose normal.     Mouth/Throat:     Mouth:  Mucous membranes are moist.  Eyes:     Extraocular Movements: Extraocular movements intact.     Pupils: Pupils are equal, round, and reactive to light.  Cardiovascular:     Rate and Rhythm: Normal rate.     Pulses: Normal pulses.  Pulmonary:     Effort: Pulmonary effort is normal.     Breath sounds: Normal breath sounds.  Abdominal:     General: Abdomen is flat.     Palpations: Abdomen is soft.  Musculoskeletal:     Cervical back: Normal range of motion.     Comments: Normal range of motion of the right shoulder.  No obvious shoulder deformity.  Mild deltoid tenderness  Skin:    General: Skin is warm.     Capillary Refill: Capillary refill takes less than 2 seconds.  Neurological:     General: No focal deficit present.     Mental Status: She is alert and oriented to person, place, and time.  Psychiatric:        Mood and Affect: Mood normal.        Behavior: Behavior normal.     ED Results / Procedures / Treatments   Labs (all labs ordered are listed, but only  abnormal results are displayed) Labs Reviewed - No data to display  EKG None  Radiology DG Shoulder Right  Result Date: 07/26/2021 CLINICAL DATA:  Pain right shoulder x 1.5 months EXAM: RIGHT SHOULDER - 2+ VIEW COMPARISON:  None Available. FINDINGS: There is no evidence of fracture or dislocation. There is no evidence of arthropathy or other focal bone abnormality. Soft tissues are unremarkable. IMPRESSION: No radiographic abnormality is seen in right shoulder. Electronically Signed   By: Ernie Avena M.D.   On: 07/26/2021 13:43    Procedures Procedures    Medications Ordered in ED Medications - No data to display  ED Course/ Medical Decision Making/ A&P                           Medical Decision Making Dominique Webb is a 36 y.o. female here presenting with right shoulder pain.  Likely overuse injury.  Consider deltoid strain versus rotator cuff injury.  I reviewed patient's x-ray and did not show  any fractures.  She already had shoulder sling.  We will continue Motrin and add Flexeril.  We will also refer to Ortho outpatient to get an MRI if she has persistent pain    Problems Addressed: Rotator cuff arthropathy, right: acute illness or injury  Amount and/or Complexity of Data Reviewed Radiology: ordered and independent interpretation performed. Decision-making details documented in ED Course.   Final Clinical Impression(s) / ED Diagnoses Final diagnoses:  None    Rx / DC Orders ED Discharge Orders     None         Charlynne Pander, MD 07/26/21 [phone]

## 2021-07-26 NOTE — ED Triage Notes (Signed)
Pt came in for Rt arm pain that started approx. 1.5 month. Denies any recent injuries or falls, endorses tingling up that arm. A/Ox4, & rates pain 7/10.

## 2021-08-07 ENCOUNTER — Encounter: Payer: Self-pay | Admitting: Family

## 2021-08-07 ENCOUNTER — Ambulatory Visit (INDEPENDENT_AMBULATORY_CARE_PROVIDER_SITE_OTHER): Payer: BC Managed Care – PPO | Admitting: Family

## 2021-08-07 VITALS — BP 98/78 | HR 57 | Resp 20 | Ht 64.0 in | Wt 128.4 lb

## 2021-08-07 DIAGNOSIS — Z0000 Encounter for general adult medical examination without abnormal findings: Secondary | ICD-10-CM

## 2021-08-07 DIAGNOSIS — Z13220 Encounter for screening for lipoid disorders: Secondary | ICD-10-CM | POA: Diagnosis not present

## 2021-08-07 LAB — COMPREHENSIVE METABOLIC PANEL
ALT: 13 U/L (ref 0–35)
AST: 20 U/L (ref 0–37)
Albumin: 4.7 g/dL (ref 3.5–5.2)
Alkaline Phosphatase: 51 U/L (ref 39–117)
BUN: 11 mg/dL (ref 6–23)
CO2: 28 mEq/L (ref 19–32)
Calcium: 9.5 mg/dL (ref 8.4–10.5)
Chloride: 104 mEq/L (ref 96–112)
Creatinine, Ser: 0.89 mg/dL (ref 0.40–1.20)
GFR: 82.25 mL/min (ref >60.00)
Glucose, Bld: 76 mg/dL (ref 70–99)
Potassium: 3.9 mEq/L (ref 3.5–5.1)
Sodium: 139 mEq/L (ref 135–145)
Total Bilirubin: 0.7 mg/dL (ref 0.2–1.2)
Total Protein: 7.2 g/dL (ref 6.0–8.3)

## 2021-08-07 LAB — LIPID PANEL
Cholesterol: 117 mg/dL (ref 0–200)
HDL: 55.7 mg/dL (ref >39.00)
LDL Cholesterol: 52 mg/dL (ref 0–99)
NonHDL: 60.91
Total CHOL/HDL Ratio: 2
Triglycerides: 46 mg/dL (ref 0.0–149.0)
VLDL: 9.2 mg/dL (ref 0.0–40.0)

## 2021-08-07 LAB — CBC WITH DIFFERENTIAL/PLATELET
Basophils Absolute: 0 10*3/uL (ref 0.0–0.1)
Basophils Relative: 0.4 % (ref 0.0–3.0)
Eosinophils Absolute: 0.1 10*3/uL (ref 0.0–0.7)
Eosinophils Relative: 1.7 % (ref 0.0–5.0)
HCT: 39.9 % (ref 36.0–46.0)
Hemoglobin: 13.4 g/dL (ref 12.0–15.0)
Lymphocytes Relative: 27.1 % (ref 12.0–46.0)
Lymphs Abs: 2.3 10*3/uL (ref 0.7–4.0)
MCHC: 33.5 g/dL (ref 30.0–36.0)
MCV: 96.9 fl (ref 78.0–100.0)
Monocytes Absolute: 0.7 10*3/uL (ref 0.1–1.0)
Monocytes Relative: 7.8 % (ref 3.0–12.0)
Neutro Abs: 5.4 10*3/uL (ref 1.4–7.7)
Neutrophils Relative %: 63 % (ref 43.0–77.0)
Platelets: 195 10*3/uL (ref 150.0–400.0)
RBC: 4.12 Mil/uL (ref 3.87–5.11)
RDW: 13.3 % (ref 11.5–15.5)
WBC: 8.6 10*3/uL (ref 4.0–10.5)

## 2021-08-07 LAB — TSH: TSH: 1.02 u[IU]/mL (ref 0.35–5.50)

## 2021-08-07 NOTE — Progress Notes (Signed)
Dominique Webb is a 36 y.o. female with the following history as recorded in EpicCare:  Patient Active Problem List   Diagnosis Date Noted   Nexplanon in place 09/19/2016    Current Outpatient Medications  Medication Sig Dispense Refill   cyclobenzaprine (FLEXERIL) 5 MG tablet Take 1 tablet (5 mg total) by mouth 3 (three) times daily as needed. 10 tablet 0   Etonogestrel (NEXPLANON [place]) Inject into the skin. Placed 2022     ibuprofen (ADVIL) 800 MG tablet Take 1 tablet (800 mg total) by mouth 3 (three) times daily. 21 tablet 0   Multiple Vitamins-Minerals (HAIR SKIN AND NAILS FORMULA PO) Take by mouth daily.     No current facility-administered medications for this visit.    Allergies: Aspirin  Past Medical History:  Diagnosis Date   Medical history non-contributory     Past Surgical History:  Procedure Laterality Date   WISDOM TOOTH EXTRACTION      Family History  Problem Relation Age of Onset   Diabetes Mother    Hypertension Mother    Hyperlipidemia Mother    Stroke Father    Hypertension Father     Social History   Tobacco Use   Smoking status: Every Day    Packs/day: 0.50    Years: 16.00    Total pack years: 8.00    Types: Cigarettes    Start date: 2004    Last attempt to quit: 04/07/2020    Years since quitting: 1.3   Smokeless tobacco: Never   Tobacco comments:    pt smokes some days - quit with pregnancy  Substance Use Topics   Alcohol use: Not Currently    Comment: Social    Subjective:   Presents for yearly CPE; does see GYN- due for appointment there;  Nexplanon placed 2022; daughter born 08/2020- enjoying being a mother;   Scheduled to see orthopedist tomorrow regarding chronic shoulder pain;   Working on tapering off cigarettes to quit completely;   Review of Systems  Constitutional: Negative.   HENT: Negative.    Eyes: Negative.   Respiratory: Negative.    Cardiovascular: Negative.   Gastrointestinal: Negative.   Genitourinary: Negative.    Musculoskeletal:  Positive for joint pain.  Skin: Negative.   Neurological: Negative.   Endo/Heme/Allergies: Negative.   Psychiatric/Behavioral: Negative.         Objective:  Vitals:   08/07/21 0924  BP: 98/78  Pulse: (!) 57  Resp: 20  SpO2: 99%  Weight: 128 lb 6.4 oz (58.2 kg)  Height: $Remove'5\' 4"'HLFJxxy$  (1.626 m)    General: Well developed, well nourished, in no acute distress  Skin : Warm and dry.  Head: Normocephalic and atraumatic  Eyes: Sclera and conjunctiva clear; pupils round and reactive to light; extraocular movements intact  Ears: External normal; canals clear; tympanic membranes normal  Oropharynx: Pink, supple. No suspicious lesions  Neck: Supple without thyromegaly, adenopathy  Lungs: Respirations unlabored; clear to auscultation bilaterally without wheeze, rales, rhonchi  CVS exam: normal rate and regular rhythm.  Abdomen: Soft; nontender; nondistended; normoactive bowel sounds; no masses or hepatosplenomegaly  Musculoskeletal: No deformities; no active joint inflammation  Extremities: No edema, cyanosis, clubbing  Vessels: Symmetric bilaterally  Neurologic: Alert and oriented; speech intact; face symmetrical; moves all extremities well; CNII-XII intact without focal deficit   Assessment:  1. PE (physical exam), annual   2. Lipid screening     Plan:  Age appropriate preventive healthcare needs addressed; encouraged regular eye doctor and dental  exams; encouraged regular exercise; will update labs and refills as needed today; follow-up to be determined; She will plan to see her GYN for yearly CPE and keep planned follow up with orthopedics;  Encouraged to quit smoking completely;     No follow-ups on file.  Orders Placed This Encounter  Procedures   CBC with Differential/Platelet   Comp Met (CMET)   Lipid panel   TSH    Requested Prescriptions    No prescriptions requested or ordered in this encounter

## 2021-08-08 ENCOUNTER — Ambulatory Visit (INDEPENDENT_AMBULATORY_CARE_PROVIDER_SITE_OTHER): Payer: BC Managed Care – PPO | Admitting: Orthopaedic Surgery

## 2021-08-08 DIAGNOSIS — M7581 Other shoulder lesions, right shoulder: Secondary | ICD-10-CM | POA: Diagnosis not present

## 2021-08-08 DIAGNOSIS — M25511 Pain in right shoulder: Secondary | ICD-10-CM | POA: Diagnosis not present

## 2021-08-08 MED ORDER — TRIAMCINOLONE ACETONIDE 40 MG/ML IJ SUSP
80.0000 mg | INTRAMUSCULAR | Status: AC | PRN
  Administered 2021-08-08: 80 mg via INTRA_ARTICULAR

## 2021-08-08 MED ORDER — LIDOCAINE HCL 1 % IJ SOLN
4.0000 mL | INTRAMUSCULAR | Status: AC | PRN
  Administered 2021-08-08: 4 mL

## 2021-08-08 NOTE — Progress Notes (Signed)
Chief Complaint: Right shoulder pain     History of Present Illness:    Dominique Webb is a 36 y.o. female presents today with several weeks of right shoulder pain.  She any specific injury.  She has been taking ibuprofen for this.  She did present to the emergency room on 20 July where x-rays were found to be negative.  She states that she is experiencing pain on the lateral aspect shoulder.  Is having a hard time carrying her baby who is about to be alone.    Surgical History:   None  PMH/PSH/Family History/Social History/Meds/Allergies:    Past Medical History:  Diagnosis Date  . Medical history non-contributory    Past Surgical History:  Procedure Laterality Date  . WISDOM TOOTH EXTRACTION     Social History   Socioeconomic History  . Marital status: Single    Spouse name: Not on file  . Number of children: Not on file  . Years of education: 32  . Highest education level: Associate degree: academic program  Occupational History    Comment: [employer]  Tobacco Use  . Smoking status: Every Day    Packs/day: 0.50    Years: 16.00    Total pack years: 8.00    Types: Cigarettes    Start date: 2004    Last attempt to quit: 04/07/2020    Years since quitting: 1.3  . Smokeless tobacco: Never  . Tobacco comments:    pt smokes some days - quit with pregnancy  Vaping Use  . Vaping Use: Never used  Substance and Sexual Activity  . Alcohol use: Not Currently    Comment: Social  . Drug use: No  . Sexual activity: Yes    Birth control/protection: Injection, None  Other Topics Concern  . Not on file  Social History Narrative  . Not on file   Social Determinants of Health   Financial Resource Strain: Not on file  Food Insecurity: No Food Insecurity (08/15/2020)   Hunger Vital Sign   . Worried About Programme researcher, broadcasting/film/video in the Last Year: Never true   . Ran Out of Food in the Last Year: Never true  Transportation Needs: No  Transportation Needs (08/15/2020)   PRAPARE - Transportation   . Lack of Transportation (Medical): No   . Lack of Transportation (Non-Medical): No  Physical Activity: Not on file  Stress: Not on file  Social Connections: Not on file   Family History  Problem Relation Age of Onset  . Diabetes Mother   . Hypertension Mother   . Hyperlipidemia Mother   . Stroke Father   . Hypertension Father    Allergies  Allergen Reactions  . Aspirin Other (See Comments)    "stomach bleeds" Can take Advil   Current Outpatient Medications  Medication Sig Dispense Refill  . cyclobenzaprine (FLEXERIL) 5 MG tablet Take 1 tablet (5 mg total) by mouth 3 (three) times daily as needed. 10 tablet 0  . Etonogestrel (NEXPLANON [place]) Inject into the skin. Placed 2022    . ibuprofen (ADVIL) 800 MG tablet Take 1 tablet (800 mg total) by mouth 3 (three) times daily. 21 tablet 0  . Multiple Vitamins-Minerals (HAIR SKIN AND NAILS FORMULA PO) Take by mouth daily.     No current facility-administered medications for this visit.  No results found.  Review of Systems:   A ROS was performed including pertinent positives and negatives as documented in the HPI.  Physical Exam :   Constitutional: NAD and appears stated age Neurological: Alert and oriented Psych: Appropriate affect and cooperative not currently breastfeeding.   Comprehensive Musculoskeletal Exam:    Musculoskeletal Exam    Inspection Right Left  Skin No atrophy or winging No atrophy or winging  Palpation    Tenderness Lateral deltoid None  Range of Motion    Flexion (passive) 170 170  Flexion (active) 170 170  Abduction 170 170  ER at the side 70 70  Can reach behind back to T12 T12  Strength     Full Full  Special Tests    Pseudoparalytic No No  Neurologic    Fires PIN, radial, median, ulnar, musculocutaneous, axillary, suprascapular, long thoracic, and spinal accessory innervated muscles. No abnormal sensibility  Vascular/Lymphatic     Radial Pulse 2+ 2+  Cervical Exam    Patient has symmetric cervical range of motion with negative Spurling's test.  Special Test: Positive Neer impingement     Imaging:   Xray (3 views right shoulder): Normal  I personally reviewed and interpreted the radiographs.   Assessment:   36 y.o. female right-hand-dominant female presents with right shoulder rotator cuff tendinitis.  I specifically talked about the good long-term outcome with regard to this.  I discussed that ultimately I believe a ultrasound-guided subacromial injection would be a good place to start initially.  She would like to proceed with this.  I will plan to see her back as needed  Plan :    -Right shoulder subacromial injection performed after verbal consent obtained     Procedure Note  Patient: Dominique Webb             Date of Birth: 01-Apr-1985           MRN: [id number]             Visit Date: 08/08/2021  Procedures: Visit Diagnoses: No diagnosis found.  Large Joint Inj: R subacromial bursa on 08/08/2021 4:06 PM Indications: pain Details: 22 G 1.5 in needle, ultrasound-guided anterior approach  Arthrogram: No  Medications: 4 mL lidocaine 1 %; 80 mg triamcinolone acetonide 40 MG/ML Outcome: tolerated well, no immediate complications Procedure, treatment alternatives, risks and benefits explained, specific risks discussed. Consent was given by the patient. Immediately prior to procedure a time out was called to verify the correct patient, procedure, equipment, support staff and site/side marked as required. Patient was prepped and draped in the usual sterile fashion.       I personally saw and evaluated the patient, and participated in the management and treatment plan.  Huel Cote, MD Attending Physician, Orthopedic Surgery  This document was dictated using Dragon voice recognition software. A reasonable attempt at proof reading has been made to minimize errors.

## 2022-01-24 ENCOUNTER — Other Ambulatory Visit: Payer: Self-pay | Admitting: Family

## 2022-01-24 ENCOUNTER — Encounter: Payer: Self-pay | Admitting: Family

## 2022-01-24 ENCOUNTER — Ambulatory Visit (INDEPENDENT_AMBULATORY_CARE_PROVIDER_SITE_OTHER): Payer: BC Managed Care – PPO | Admitting: Family

## 2022-01-24 VITALS — BP 104/62 | Resp 18 | Ht 64.0 in | Wt 128.8 lb

## 2022-01-24 DIAGNOSIS — L309 Dermatitis, unspecified: Secondary | ICD-10-CM | POA: Diagnosis not present

## 2022-01-24 DIAGNOSIS — M79671 Pain in right foot: Secondary | ICD-10-CM | POA: Diagnosis not present

## 2022-01-24 MED ORDER — TRIAMCINOLONE ACETONIDE 0.1 % EX CREA: 1.0000 | TOPICAL_CREAM | Freq: Two times a day (BID) | CUTANEOUS | 0 refills | Status: DC

## 2022-01-24 NOTE — Progress Notes (Signed)
  Dominique Webb is a 37 y.o. female with the following history as recorded in EpicCare:  Patient Active Problem List   Diagnosis Date Noted   Nexplanon in place 09/19/2016    Current Outpatient Medications  Medication Sig Dispense Refill   Etonogestrel (NEXPLANON [place]) Inject into the skin. Placed 2022     Multiple Vitamins-Minerals (HAIR SKIN AND NAILS FORMULA PO) Take by mouth daily.     triamcinolone cream (KENALOG) 0.1 % Apply 1 Application topically 2 (two) times daily. 453.6 g 0   No current facility-administered medications for this visit.    Allergies: Aspirin  Past Medical History:  Diagnosis Date   Medical history non-contributory     Past Surgical History:  Procedure Laterality Date   WISDOM TOOTH EXTRACTION      Family History  Problem Relation Age of Onset   Diabetes Mother    Hypertension Mother    Hyperlipidemia Mother    Stroke Father    Hypertension Father     Social History   Tobacco Use   Smoking status: Every Day    Packs/day: 0.50    Years: 16.00    Total pack years: 8.00    Types: Cigarettes    Start date: 2004    Last attempt to quit: 04/07/2020    Years since quitting: 1.8   Smokeless tobacco: Never   Tobacco comments:    pt smokes some days - quit with pregnancy  Substance Use Topics   Alcohol use: Not Currently    Comment: Social    Subjective:  1) Concerned for recurrent rash below belly button; has had "on and off" for years; requesting refill on topical cream; 2) Would like to see foot specialist due to history of callus right side of heel;   Objective:  Vitals:   01/24/22 1555  BP: 104/62  Resp: 18  Weight: 128 lb 12.8 oz (58.4 kg)  Height: 5\' 4"  (1.626 m)    General: Well developed, well nourished, in no acute distress  Skin : Warm and dry. Dry scaly skin noted on abdomen beneath belly button Head: Normocephalic and atraumatic  Eyes: Sclera and conjunctiva clear; pupils round and reactive to light; extraocular movements  intact  Ears: External normal; canals clear; tympanic membranes normal  Oropharynx: Pink, supple. No suspicious lesions  Neck: Supple without thyromegaly, adenopathy  Lungs: Respirations unlabored;  Musculoskeletal: No deformities; no active joint inflammation  Extremities: No edema, cyanosis, clubbing  Vessels: Symmetric bilaterally  Neurologic: Alert and oriented; speech intact; face symmetrical; moves all extremities well; CNII-XII intact without focal deficit   Assessment:  1. Dermatitis   2. Foot pain, right     Plan:  Rx for Triamcinolone cream apply bid to affected area; can use as needed if/ when area re-flares; Refer to podiatry per request;   No follow-ups on file.  Orders Placed This Encounter  Procedures   Ambulatory referral to Podiatry    Referral Priority:   Routine    Referral Type:   Consultation    Referral Reason:   Specialty Services Required    Requested Specialty:   Podiatry    Number of Visits Requested:   1    Requested Prescriptions   Signed Prescriptions Disp Refills   triamcinolone cream (KENALOG) 0.1 % 453.6 g 0    Sig: Apply 1 Application topically 2 (two) times daily.

## 2022-01-30 ENCOUNTER — Ambulatory Visit (INDEPENDENT_AMBULATORY_CARE_PROVIDER_SITE_OTHER): Payer: BC Managed Care – PPO | Admitting: Podiatry

## 2022-01-30 DIAGNOSIS — Q6672 Congenital pes cavus, left foot: Secondary | ICD-10-CM

## 2022-01-30 DIAGNOSIS — L853 Xerosis cutis: Secondary | ICD-10-CM | POA: Diagnosis not present

## 2022-01-30 DIAGNOSIS — Q6671 Congenital pes cavus, right foot: Secondary | ICD-10-CM

## 2022-01-30 DIAGNOSIS — Q667 Congenital pes cavus, unspecified foot: Secondary | ICD-10-CM

## 2022-01-30 MED ORDER — AMMONIUM LACTATE 12 % EX LOTN: 1.0000 | TOPICAL_LOTION | CUTANEOUS | 0 refills | Status: DC | PRN

## 2022-01-30 NOTE — Progress Notes (Signed)
  Subjective:  Patient ID: Dominique Webb, female    DOB: 15-Apr-1985,  MRN: [id number]  Chief Complaint  Patient presents with   Callouses    Bilateral callus/dry skin    37 y.o. female presents with the above complaint.  Patient presents with complaint of bilateral heel hyperkeratotic lesion/callus/dry skin.  Patient states been present for quite some time is progressive gotten worse worse with ambulation worse with pressure she wanted to get it evaluated.  She has had it for quite some time she is tried over-the-counter medication which has not helped.  She also has underlying pes cavus foot type likely leading to excessive stress to the medial side of the heel.   Review of Systems: Negative except as noted in the HPI. Denies N/V/F/Ch.  Past Medical History:  Diagnosis Date   Medical history non-contributory     Current Outpatient Medications:    ammonium lactate (AMLACTIN DAILY) 12 % lotion, Apply 1 Application topically as needed for dry skin., Disp: 400 g, Rfl: 0   Etonogestrel (NEXPLANON [place]), Inject into the skin. Placed 2022, Disp: , Rfl:    Multiple Vitamins-Minerals (HAIR SKIN AND NAILS FORMULA PO), Take by mouth daily., Disp: , Rfl:    triamcinolone cream (KENALOG) 0.1 %, Apply 1 Application topically 2 (two) times daily., Disp: 453.6 g, Rfl: 0  Social History   Tobacco Use  Smoking Status Every Day   Packs/day: 0.50   Years: 16.00   Total pack years: 8.00   Types: Cigarettes   Start date: 2004   Last attempt to quit: 04/07/2020   Years since quitting: 1.8  Smokeless Tobacco Never  Tobacco Comments   pt smokes some days - quit with pregnancy    Allergies  Allergen Reactions   Aspirin Other (See Comments)    "stomach bleeds" Can take Advil   Objective:  There were no vitals filed for this visit. There is no height or weight on file to calculate BMI. Constitutional Well developed. Well nourished.  Vascular Dorsalis pedis pulses palpable  bilaterally. Posterior tibial pulses palpable bilaterally. Capillary refill normal to all digits.  No cyanosis or clubbing noted. Pedal hair growth normal.  Neurologic Normal speech. Oriented to person, place, and time. Epicritic sensation to light touch grossly present bilaterally.  Dermatologic Hyperkeratotic lesion/severe xerosis noted to bilateral heel.  No open wounds or cracks noted no fissures noted  Orthopedic: Normal joint ROM without pain or crepitus bilaterally. No visible deformities. No bony tenderness.   Radiographs: None Assessment:   1. Pes cavus   2. Xerosis of skin    Plan:  Patient was evaluated and treated and all questions answered.  Bilateral heel xerosis with underlying pes cavus foot structure -All questions and concerns were discussed with the patient in extensive detail. -She would benefit from ammonium lactate asked her to apply twice a day to help with the dryness.  She states understanding  Pes cavus -I explained to patient the etiology of pes cavus and relationship with heel xerosis and various treatment options were discussed.  Given patient foot structure in the setting of heels xerosis I believe patient will benefit from custom-made orthotics to help control the hindfoot motion support the arch of the foot and take the stress away from heel xerosis patient agrees with the plan like to proceed with orthotics -Patient was casted for orthotics   No follow-ups on file.  Bilateral heel callus ammonium lactate  Pes cavus orthotics

## 2022-02-14 ENCOUNTER — Other Ambulatory Visit: Payer: Self-pay

## 2022-02-14 ENCOUNTER — Ambulatory Visit (HOSPITAL_COMMUNITY)
Admission: EM | Admit: 2022-02-14 | Discharge: 2022-02-14 | Disposition: A | Discharge status: Home / Self Care | Payer: BC Managed Care – PPO | Attending: Family Medicine | Admitting: Family Medicine

## 2022-02-14 ENCOUNTER — Encounter (HOSPITAL_COMMUNITY): Payer: Self-pay | Admitting: Emergency Medicine

## 2022-02-14 DIAGNOSIS — K529 Noninfective gastroenteritis and colitis, unspecified: Secondary | ICD-10-CM | POA: Diagnosis not present

## 2022-02-14 DIAGNOSIS — J069 Acute upper respiratory infection, unspecified: Secondary | ICD-10-CM | POA: Diagnosis not present

## 2022-02-14 MED ORDER — ONDANSETRON 4 MG PO TBDP: 4.0000 mg | ORAL_TABLET | Freq: Three times a day (TID) | ORAL | 0 refills | Status: DC | PRN

## 2022-02-14 MED ORDER — ONDANSETRON 4 MG PO TBDP
ORAL_TABLET | ORAL | Status: AC
  Filled 2022-02-14: qty 1

## 2022-02-14 MED ORDER — ONDANSETRON 4 MG PO TBDP
4.0000 mg | ORAL_TABLET | Freq: Once | ORAL | Status: AC
  Administered 2022-02-14: 4 mg via ORAL

## 2022-02-14 NOTE — Discharge Instructions (Signed)
Ondansetron dissolved in the mouth every 8 hours as needed for nausea or vomiting. Clear liquids and bland things to eat. Avoid acidic foods like lemon/lime/orange/tomato.  

## 2022-02-14 NOTE — ED Provider Notes (Signed)
[place]    CSN: [id number] Arrival date & time: 02/14/22  1024      History   Chief Complaint Chief Complaint  Patient presents with   Emesis    HPI Dominique Webb is a 37 y.o. female.    Emesis  Here for nausea and vomiting and diarrhea.  She has thrown up once today and has had 3 episodes of diarrhea today No fever or chills; no abdominal pain or cramping.  She has had some cough and congestion, this started on February 3. Past Medical History:  Diagnosis Date   Medical history non-contributory     Patient Active Problem List   Diagnosis Date Noted   Nexplanon in place 09/19/2016    Past Surgical History:  Procedure Laterality Date   WISDOM TOOTH EXTRACTION      OB History     Gravida  1   Para  1   Term  1   Preterm      AB      Living  1      SAB      IAB      Ectopic      Multiple  0   Live Births  1            Home Medications    Prior to Admission medications   Medication Sig Start Date End Date Taking? Authorizing Provider  ondansetron (ZOFRAN-ODT) 4 MG disintegrating tablet Take 1 tablet (4 mg total) by mouth every 8 (eight) hours as needed for nausea or vomiting. 02/14/22  Yes Barrett Henle, MD  ammonium lactate (AMLACTIN DAILY) 12 % lotion Apply 1 Application topically as needed for dry skin. 01/30/22   Felipa Furnace, DPM  Etonogestrel (NEXPLANON ) Inject into the skin. Placed 2022    [provider]  Multiple Vitamins-Minerals (HAIR SKIN AND NAILS FORMULA PO) Take by mouth daily.    [provider]  triamcinolone cream (KENALOG) 0.1 % Apply 1 Application topically 2 (two) times daily. 01/24/22   Marrian Salvage, FNP    Family History Family History  Problem Relation Age of Onset   Diabetes Mother    Hypertension Mother    Hyperlipidemia Mother    Stroke Father    Hypertension Father     Social History Social History   Tobacco Use   Smoking status: Every Day     Packs/day: 0.50    Years: 16.00    Total pack years: 8.00    Types: Cigarettes    Start date: 2004    Last attempt to quit: 04/07/2020    Years since quitting: 1.8   Smokeless tobacco: Never   Tobacco comments:    pt smokes some days - quit with pregnancy  Vaping Use   Vaping Use: Never used  Substance Use Topics   Alcohol use: Yes    Comment: Social   Drug use: No     Allergies   Aspirin   Review of Systems Review of Systems  Gastrointestinal:  Positive for vomiting.     Physical Exam Triage Vital Signs ED Triage Vitals  Enc Vitals Group     BP 02/14/22 1150 110/64     Pulse Rate 02/14/22 1150 78     Resp 02/14/22 1150 18     Temp 02/14/22 1150 98.3 F (36.8 C)     Temp Source 02/14/22 1150 Oral     SpO2 02/14/22 1150 99 %     Weight --  Height --      Head Circumference --      Peak Flow --      Pain Score 02/14/22 1148 8     Pain Loc --      Pain Edu? --      Excl. in [place]? --    No data found.  Updated Vital Signs BP 110/64 (BP Location: Left Arm)   Pulse 78   Temp 98.3 F (36.8 C) (Oral)   Resp 18   LMP 12/07/2021   SpO2 99%   Visual Acuity Right Eye Distance:   Left Eye Distance:   Bilateral Distance:    Right Eye Near:   Left Eye Near:    Bilateral Near:     Physical Exam Vitals reviewed.  Constitutional:      General: She is not in acute distress.    Appearance: She is not toxic-appearing.  HENT:     Right Ear: Tympanic membrane and ear canal normal.     Left Ear: Tympanic membrane and ear canal normal.     Nose: Nose normal.     Mouth/Throat:     Mouth: Mucous membranes are moist.     Pharynx: No oropharyngeal exudate or posterior oropharyngeal erythema.  Eyes:     Extraocular Movements: Extraocular movements intact.     Conjunctiva/sclera: Conjunctivae normal.     Pupils: Pupils are equal, round, and reactive to light.  Cardiovascular:     Rate and Rhythm: Normal rate and regular rhythm.     Heart sounds: No murmur  heard. Pulmonary:     Effort: Pulmonary effort is normal. No respiratory distress.     Breath sounds: No stridor. No wheezing, rhonchi or rales.  Chest:     Chest wall: No tenderness.  Abdominal:     Palpations: Abdomen is soft.     Tenderness: There is no abdominal tenderness.  Musculoskeletal:     Cervical back: Neck supple.  Lymphadenopathy:     Cervical: No cervical adenopathy.  Skin:    Capillary Refill: Capillary refill takes less than 2 seconds.     Coloration: Skin is not jaundiced or pale.  Neurological:     General: No focal deficit present.     Mental Status: She is alert and oriented to person, place, and time.  Psychiatric:        Behavior: Behavior normal.      UC Treatments / Results  Labs (all labs ordered are listed, but only abnormal results are displayed) Labs Reviewed - No data to display  EKG   Radiology No results found.  Procedures Procedures (including critical care time)  Medications Ordered in UC Medications  ondansetron (ZOFRAN-ODT) disintegrating tablet 4 mg (4 mg Oral Given 02/14/22 1156)    Initial Impression / Assessment and Plan / UC Course  I have reviewed the triage vital signs and the nursing notes.  Pertinent labs & imaging results that were available during my care of the patient were reviewed by me and considered in my medical decision making (see chart for details).        Zofran is sent for her nausea.  She is on day 5 of the respiratory symptoms, so not going to do any viral swabs.  By the time we had results, treatment would not be any different. Final Clinical Impressions(s) / UC Diagnoses   Final diagnoses:  Gastroenteritis  Viral URI     Discharge Instructions      Ondansetron dissolved in the mouth  every 8 hours as needed for nausea or vomiting. Clear liquids and bland things to eat. Avoid acidic foods like lemon/lime/orange/tomato.       ED Prescriptions     Medication Sig Dispense Auth. Provider    ondansetron (ZOFRAN-ODT) 4 MG disintegrating tablet Take 1 tablet (4 mg total) by mouth every 8 (eight) hours as needed for nausea or vomiting. 10 tablet Windy Carina Gwenlyn Perking, MD      PDMP not reviewed this encounter.   Barrett Henle, MD 02/14/22 [phone]

## 2022-02-14 NOTE — ED Triage Notes (Signed)
Vomiting and diarrhea started yesterday.  One episode of vomiting today.  Has had 3 episodes of diarrhea today.  generalized abdominal pain.    Patient has taken pepto bismal, and no improvement

## 2022-03-04 ENCOUNTER — Ambulatory Visit (HOSPITAL_COMMUNITY)
Admission: EM | Admit: 2022-03-04 | Discharge: 2022-03-04 | Disposition: A | Discharge status: Home / Self Care | Payer: BC Managed Care – PPO | Attending: Family Medicine | Admitting: Family Medicine

## 2022-03-04 ENCOUNTER — Encounter (HOSPITAL_COMMUNITY): Payer: Self-pay | Admitting: *Deleted

## 2022-03-04 ENCOUNTER — Other Ambulatory Visit: Payer: Self-pay

## 2022-03-04 DIAGNOSIS — Z20822 Contact with and (suspected) exposure to covid-19: Secondary | ICD-10-CM | POA: Insufficient documentation

## 2022-03-04 LAB — SARS CORONAVIRUS 2 (TAT 6-24 HRS): SARS Coronavirus 2: NEGATIVE

## 2022-03-04 NOTE — ED Triage Notes (Signed)
T. Pt's job requires her to get tested before returning to work. Pt also has a child that was around [place] mother.

## 2022-03-04 NOTE — ED Provider Notes (Signed)
[place]   [id number] 03/04/22 Arrival Time: [id number]  ASSESSMENT & PLAN:  1. Exposure to COVID-19 virus    Pending: Labs Reviewed  SARS CORONAVIRUS 2 (TAT 6-24 HRS)  OTC symptom care as needed should she develop symptoms. Work note.    Discharge Instructions      You have been tested for COVID-19 today. If your test returns positive, you will receive a phone call from [facility] regarding your results. Negative test results are not called. Both positive and negative results area always visible on MyChart. If you do not have a MyChart account, sign up instructions are provided in your discharge papers. Please do not hesitate to contact us should you have questions or concerns.     Reviewed expectations re: course of current medical issues. Questions answered. Outlined signs and symptoms indicating need for more acute intervention. Understanding verbalized. After Visit Summary given.   SUBJECTIVE: History from: Patient. Dominique Webb is a 37 y.o. female. Reports COVID exposure; family member recently. Pt's job requires her to get tested before returning to work. Pt also has a child that was around [place] mother. Denies: runny nose, congestion, and fever. Normal PO intake without n/v/d.  OBJECTIVE:  Vitals:   03/04/22 1253  BP: 112/69  Pulse: 63  Resp: 16  Temp: 98 F (36.7 C)  SpO2: 93%    General appearance: alert; no distress Eyes: PERRLA; EOMI; conjunctiva normal HENT: [place]; AT; without nasal congestion Neck: supple  Lungs: speaks full sentences without difficulty; unlabored Extremities: no edema Skin: warm and dry Neurologic: normal gait Psychological: alert and cooperative; normal mood and affect  Labs:  Labs Reviewed  SARS CORONAVIRUS 2 (TAT 6-24 HRS)    Allergies  Allergen Reactions   Aspirin Other (See Comments)    "stomach bleeds" Can take Advil    Past Medical History:  Diagnosis Date   Medical history non-contributory     Social History   Socioeconomic History   Marital status: Single    Spouse name: Not on file   Number of children: Not on file   Years of education: 16   Highest education level: Associate degree: academic program  Occupational History    Comment: [employer]  Tobacco Use   Smoking status: Every Day    Packs/day: 0.50    Years: 16.00    Total pack years: 8.00    Types: Cigarettes    Start date: 2004    Last attempt to quit: 04/07/2020    Years since quitting: 1.9   Smokeless tobacco: Never   Tobacco comments:    pt smokes some days - quit with pregnancy  Vaping Use   Vaping Use: Never used  Substance and Sexual Activity   Alcohol use: Yes    Comment: Social   Drug use: No   Sexual activity: Yes    Birth control/protection: Implant  Other Topics Concern   Not on file  Social History Narrative   Not on file   Social Determinants of Health   Financial Resource Strain: Not on file  Food Insecurity: No Food Insecurity (08/15/2020)   Hunger Vital Sign    Worried About Running Out of Food in the Last Year: Never true    Ran Out of Food in the Last Year: Never true  Transportation Needs: No Transportation Needs (08/15/2020)   PRAPARE - Hydrologist (Medical): No    Lack of Transportation (Non-Medical): No  Physical Activity: Not on file  Stress: Not on file  Social Connections: Not on file  Intimate Partner Violence: Not on file   Family History  Problem Relation Age of Onset   Diabetes Mother    Hypertension Mother    Hyperlipidemia Mother    Stroke Father    Hypertension Father    Past Surgical History:  Procedure Laterality Date   WISDOM TOOTH EXTRACTION       Vanessa Kick, MD 03/04/22 [phone]

## 2022-03-04 NOTE — Discharge Instructions (Addendum)
You have been tested for COVID-19 today. °If your test returns positive, you will receive a phone call from [facility] regarding your results. °Negative test results are not called. °Both positive and negative results area always visible on MyChart. °If you do not have a MyChart account, sign up instructions are provided in your discharge papers. °Please do not hesitate to contact us should you have questions or concerns. ° °

## 2022-03-06 ENCOUNTER — Telehealth: Payer: Self-pay | Admitting: Podiatry

## 2022-03-06 NOTE — Telephone Encounter (Signed)
Lmom for patient to call back to schedule picking up orthotics   Balance is $[phone]

## 2022-03-08 ENCOUNTER — Other Ambulatory Visit: Payer: BC Managed Care – PPO

## 2022-03-15 ENCOUNTER — Ambulatory Visit: Payer: BC Managed Care – PPO | Admitting: *Deleted

## 2022-03-15 DIAGNOSIS — Q667 Congenital pes cavus, unspecified foot: Secondary | ICD-10-CM

## 2022-03-15 NOTE — Progress Notes (Signed)
Patient presents today to pick up custom molded foot orthotics, diagnosed with pes cavus by Dr. Patel.   Orthotics were dispensed and fit was satisfactory. Reviewed instructions for break-in and wear. Written instructions given to patient.  Patient will follow up as needed.      

## 2022-05-17 IMAGING — US US MFM UA CORD DOPPLER
1 series · 12 of 28 positions shown · non-contrast
Comparison: none

[Series 1: us mfm ua cord doppler · 36 acquisitions, 12 frames shown]
[im 2/36]
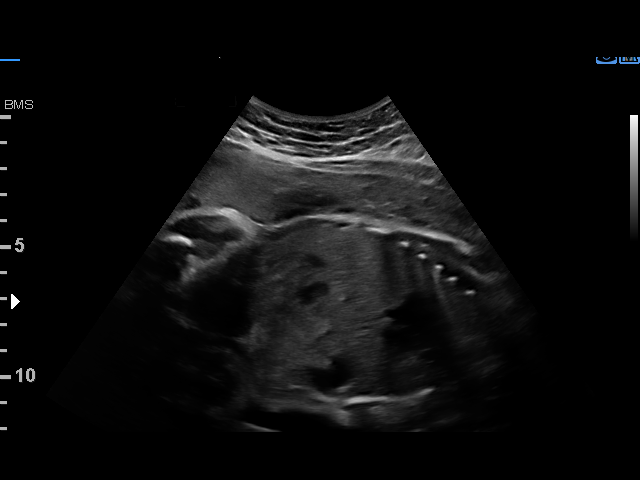
[im 4/36]
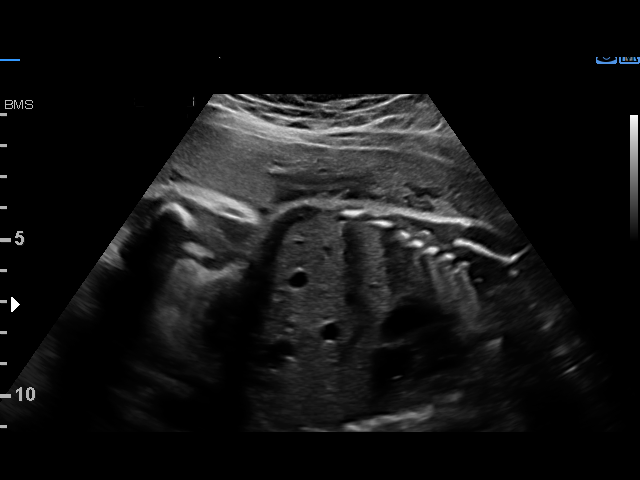
[im 7/36]
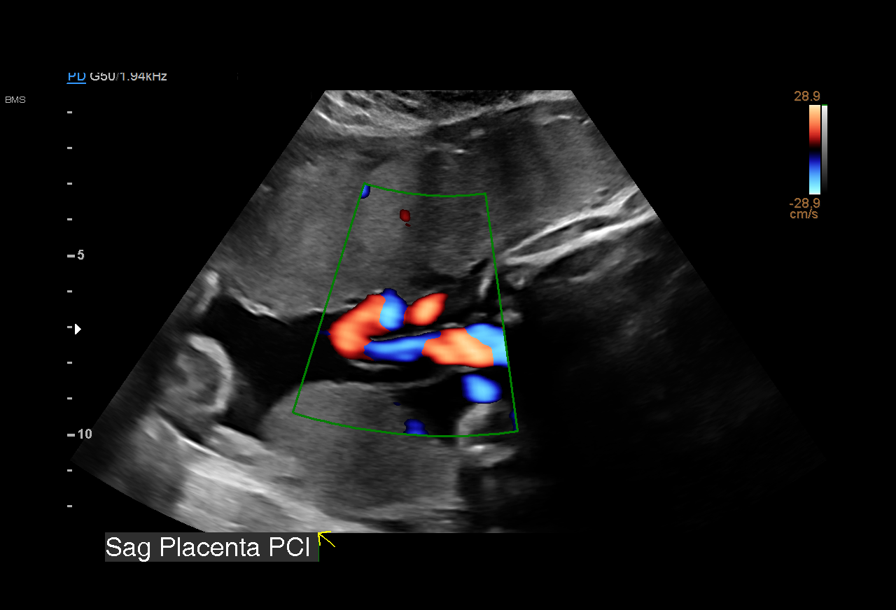
[im 11/36]
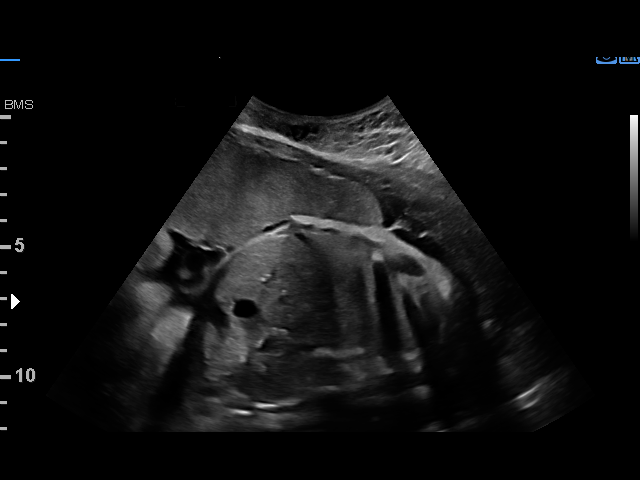
[im 13/36]
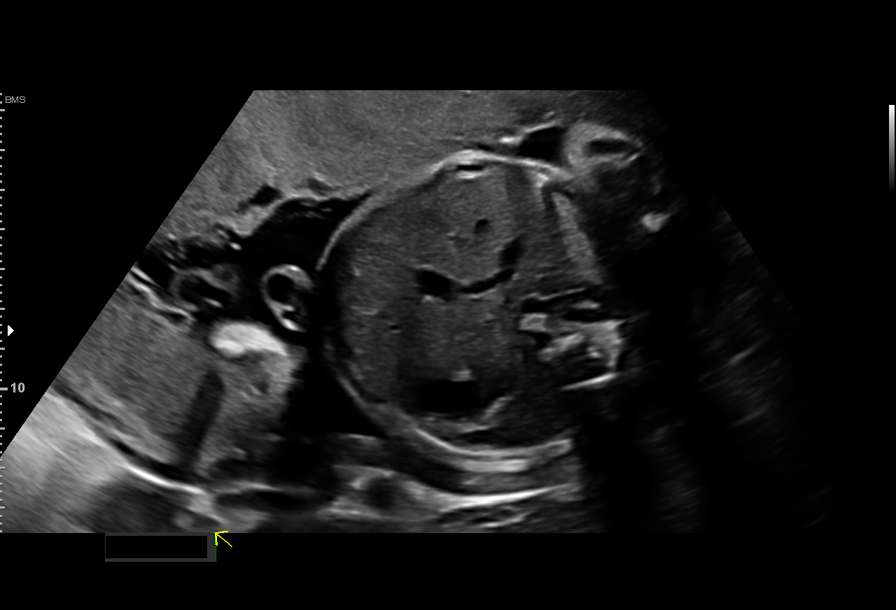
[im 16/36]
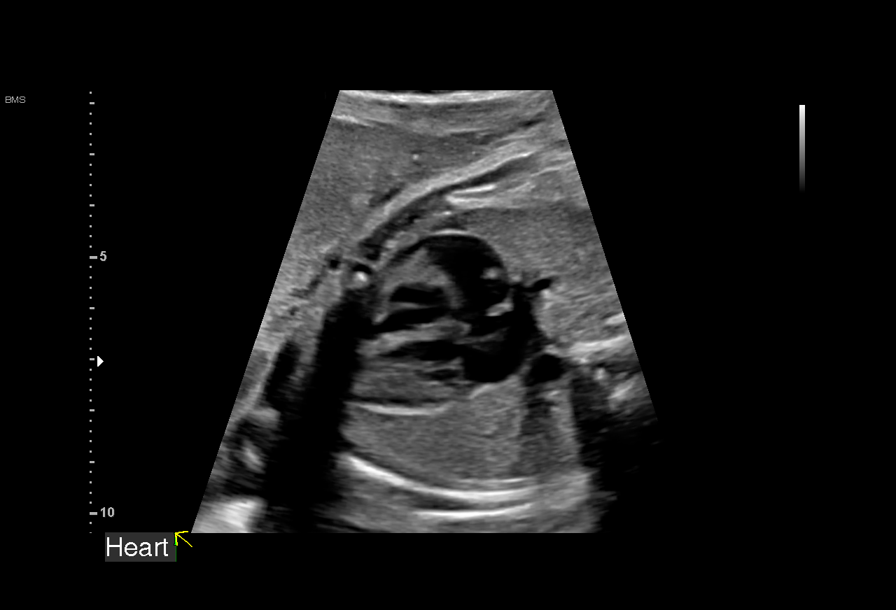
[im 20/36]
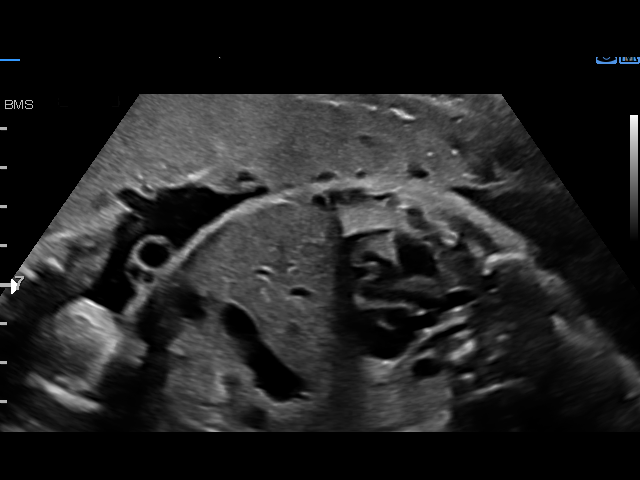
[im 23/36]
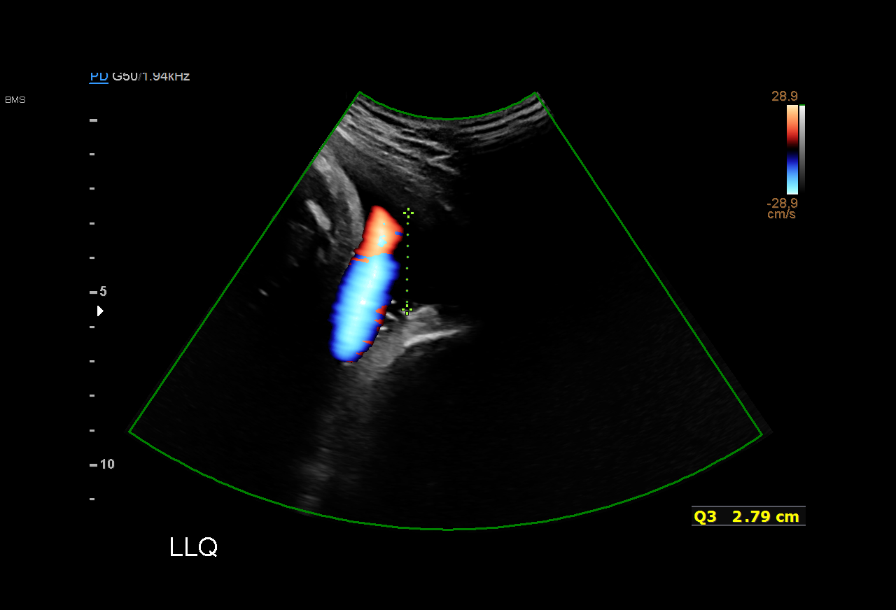
[im 25/36]
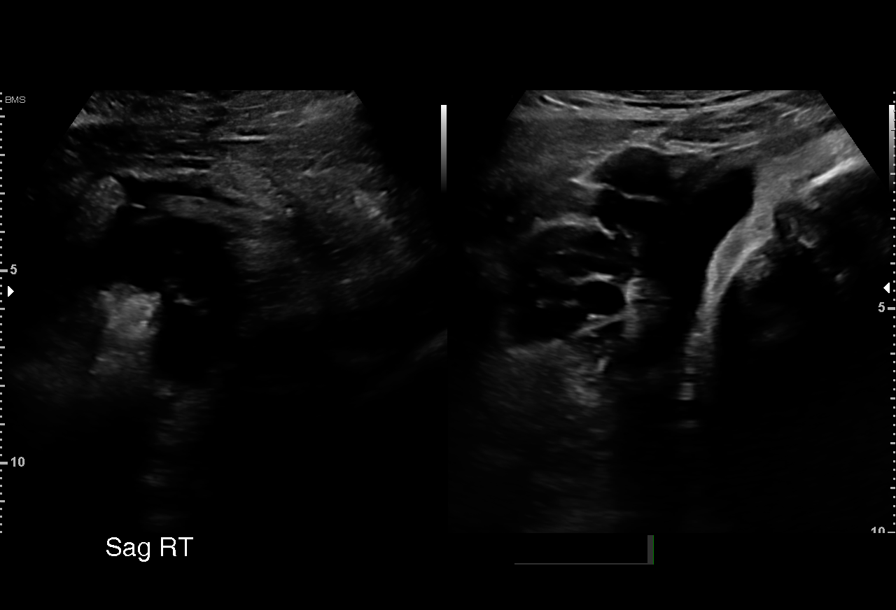
[im 29/36]
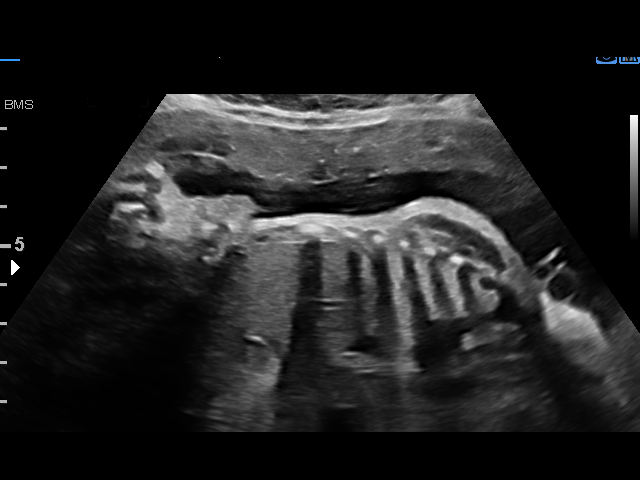
[im 32/36]
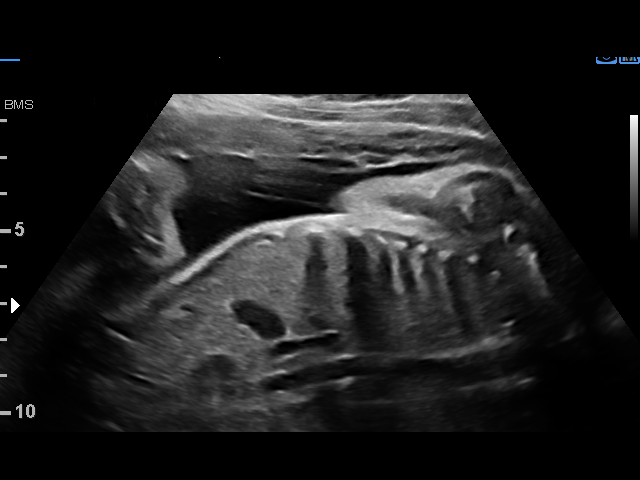
[im 34/36]
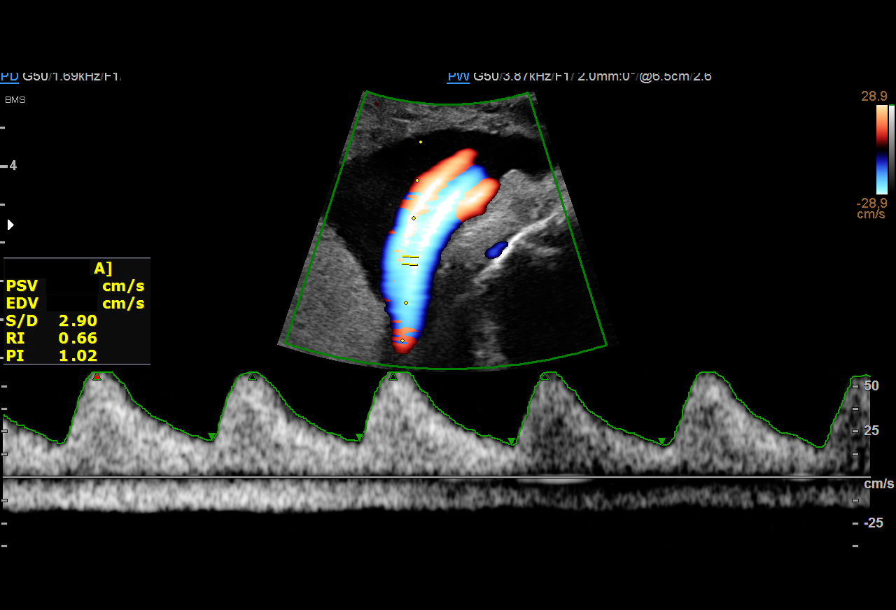

[12 of 28 positions shown; findings below may reference images not displayed]

[REDACTED]. [HOSPITAL],
                   [facility] CNM

Indications

 Maternal care for known or suspected poor
 fetal growth, third trimester, fetus 1 IUGR
 Advanced maternal age multigravida 35+,
 second trimester
 Encounter for antenatal screening for
 malformations
 Low Risk NIPS

## 2022-06-14 IMAGING — US US MFM FETAL BPP W/ NON-STRESS
1 series · 13 of 27 positions shown · non-contrast
Comparison: none

[Series 1: us mfm fetal bpp w/ non-stress · 27 acquisitions, 13 frames shown]
[im 2/27]
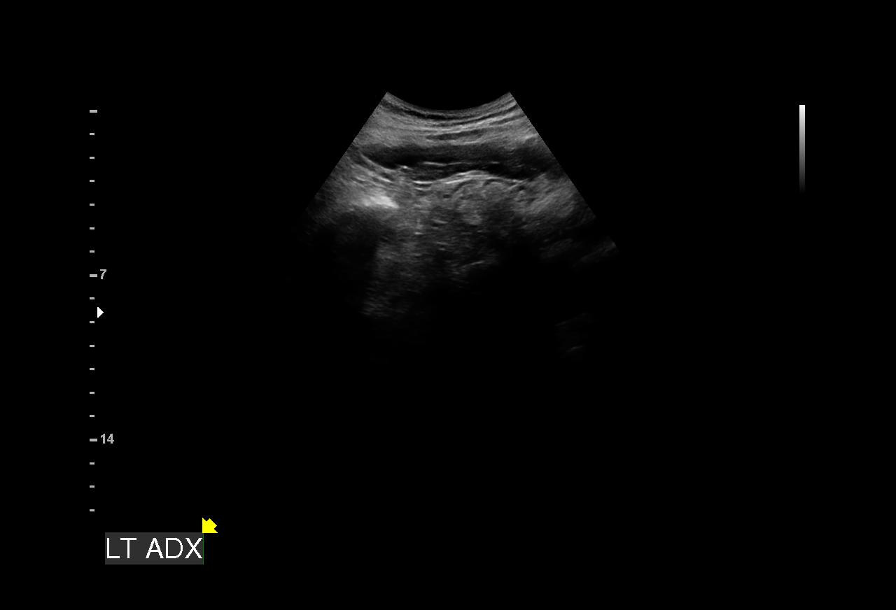
[im 4/27]
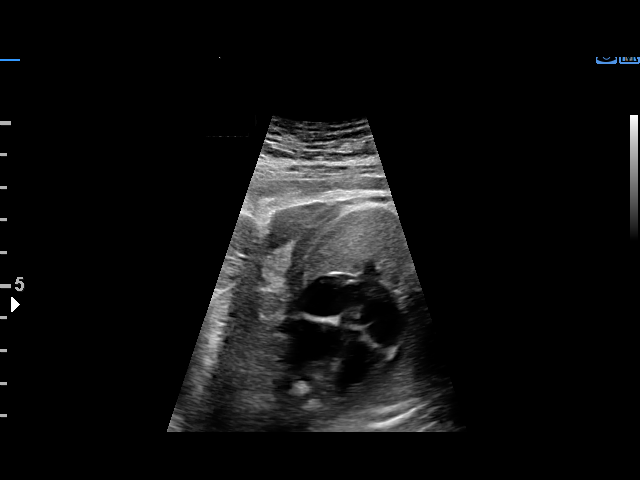
[im 6/27]
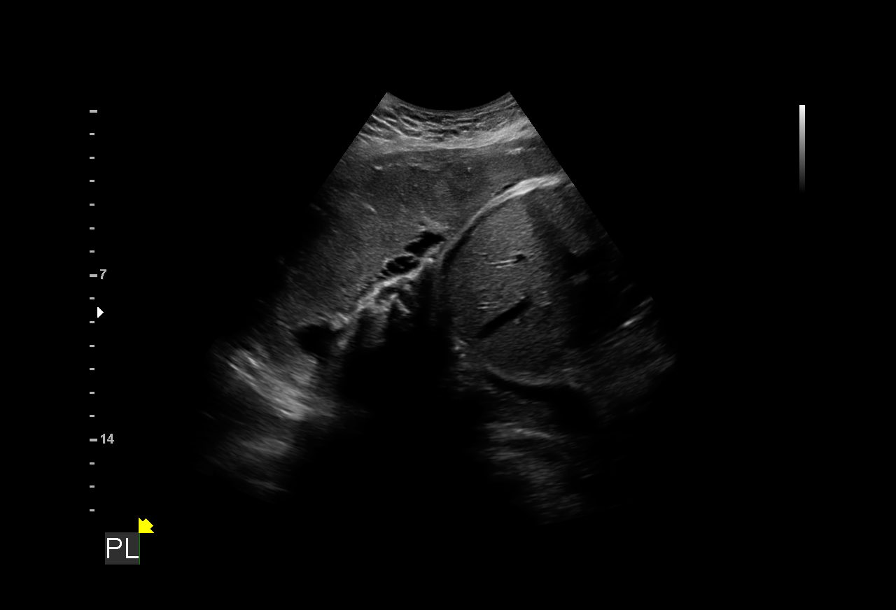
[im 8/27]
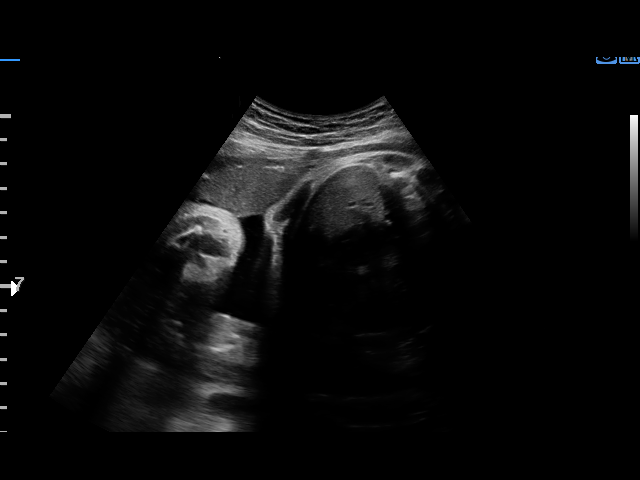
[im 10/27]
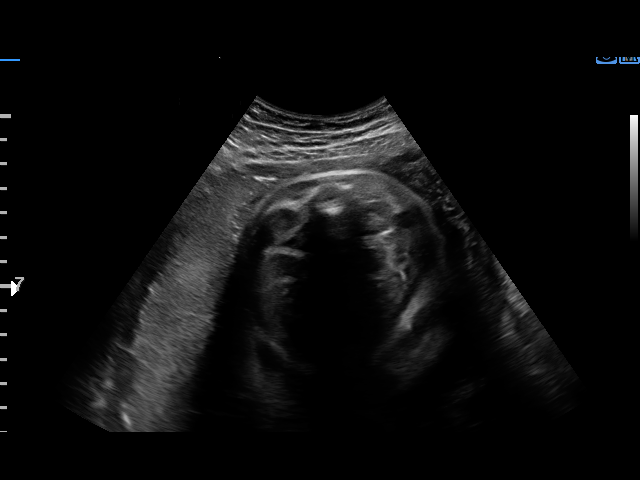
[im 12/27]
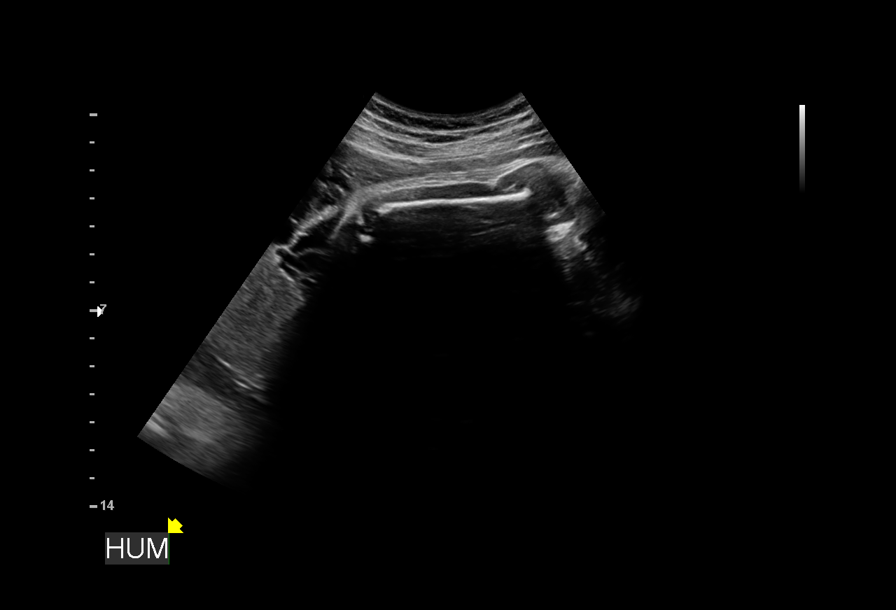
[im 14/27]
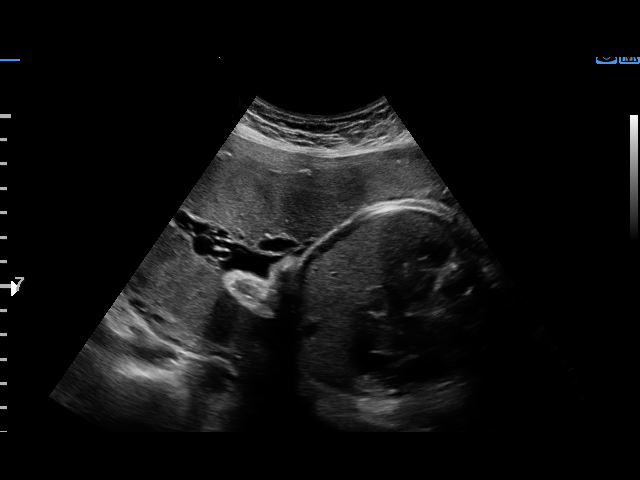
[im 16/27]
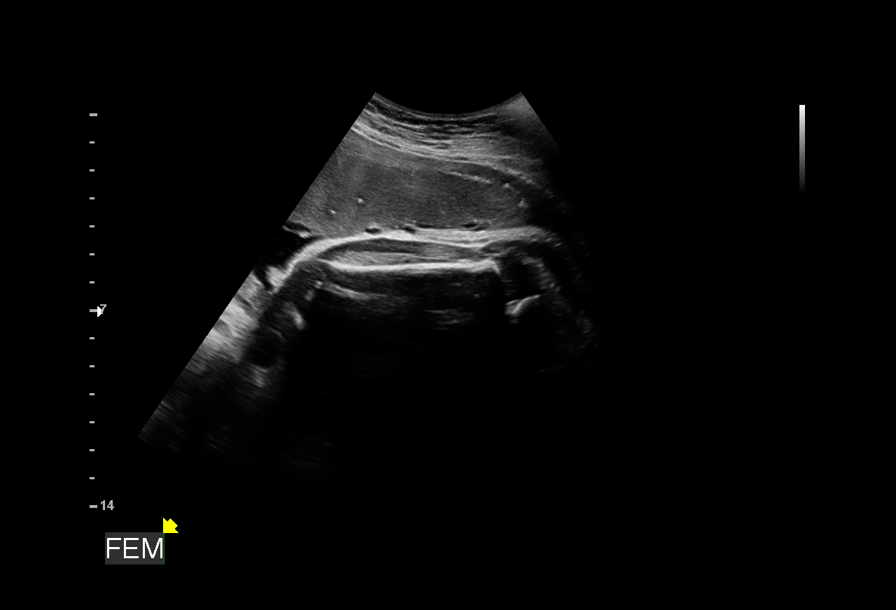
[im 18/27]
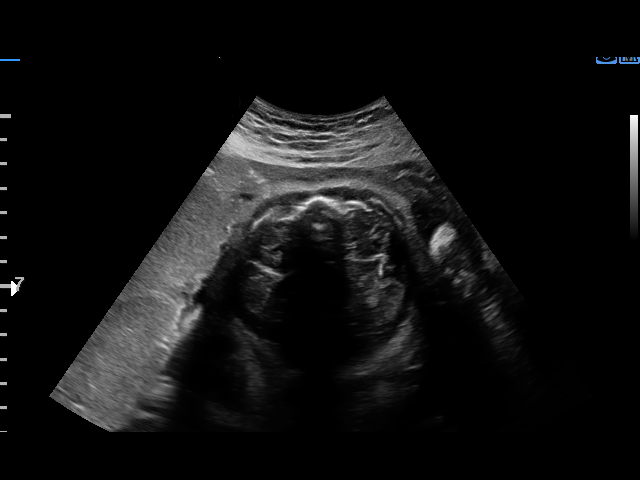
[im 20/27]
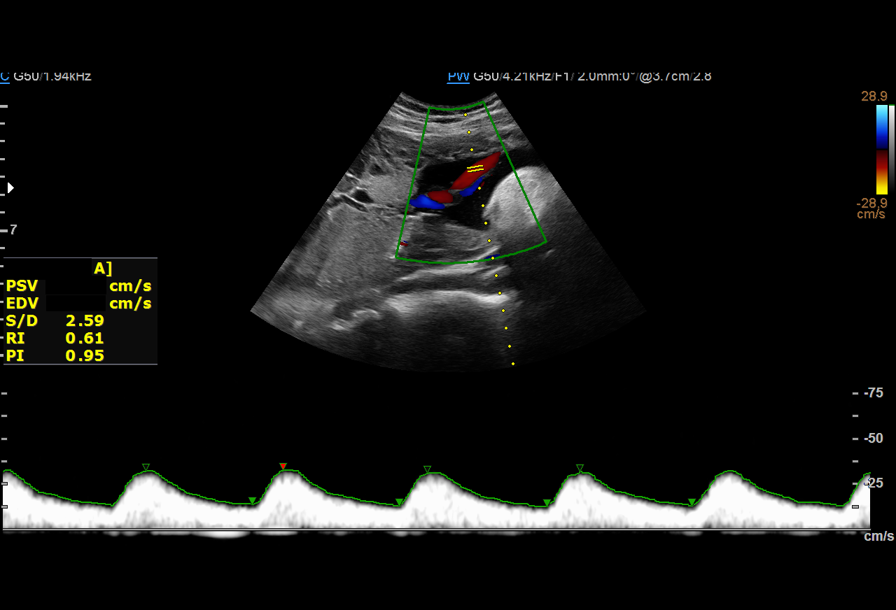
[im 22/27]
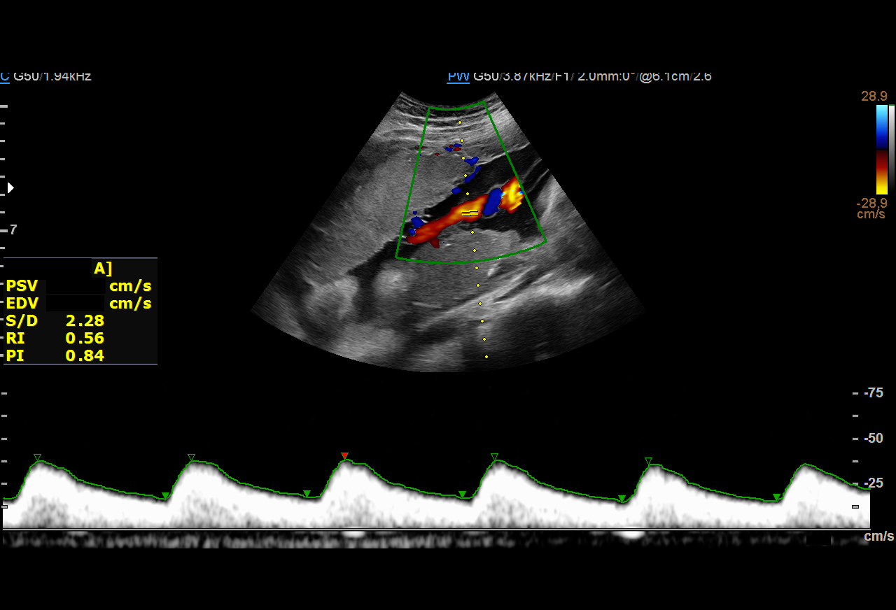
[im 24/27]
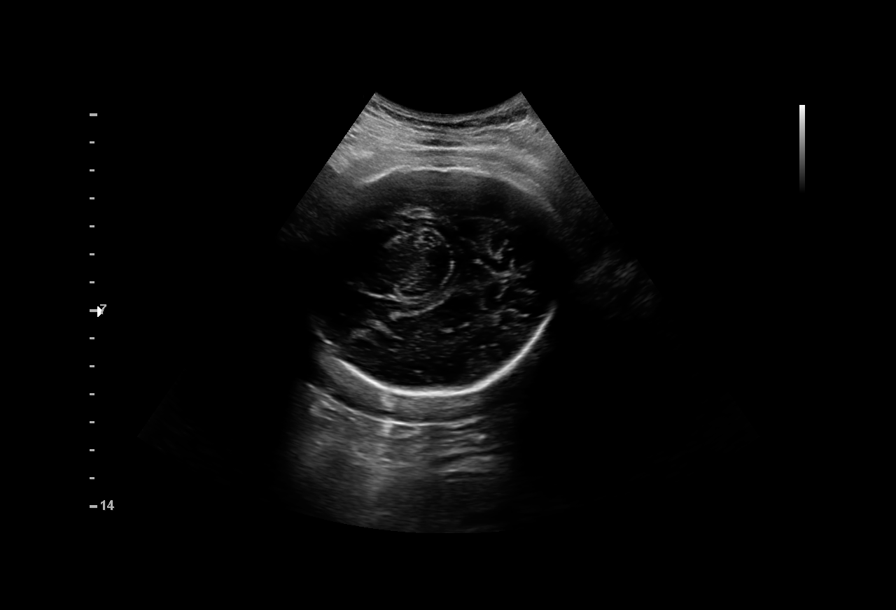
[im 26/27]
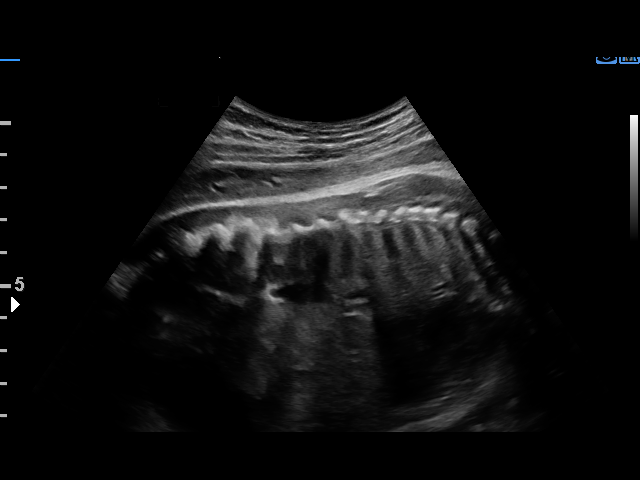

[13 of 27 positions shown; findings below may reference images not displayed]

[REDACTED]. [HOSPITAL],
                   QUIRIJ[facility] CNM

    W/NONSTRESS

Indications

 36 weeks gestation of pregnancy
 Maternal care for known or suspected poor
 fetal growth, third trimester, fetus 1 IUGR
 Advanced maternal age multigravida 35+,
 third trimester
 Low Risk NIPS

## 2022-11-20 ENCOUNTER — Other Ambulatory Visit (HOSPITAL_COMMUNITY)
Admission: RE | Admit: 2022-11-20 | Discharge: 2022-11-20 | Disposition: A | Discharge status: Home / Self Care | Payer: BC Managed Care – PPO | Source: Ambulatory Visit | Attending: Obstetrics and Gynecology | Admitting: Obstetrics and Gynecology

## 2022-11-20 ENCOUNTER — Other Ambulatory Visit: Payer: Self-pay

## 2022-11-20 ENCOUNTER — Encounter: Payer: Self-pay | Admitting: Obstetrics and Gynecology

## 2022-11-20 ENCOUNTER — Ambulatory Visit: Payer: BC Managed Care – PPO | Admitting: Obstetrics and Gynecology

## 2022-11-20 VITALS — BP 108/75 | HR 62 | Ht 64.0 in | Wt 117.5 lb

## 2022-11-20 DIAGNOSIS — Z202 Contact with and (suspected) exposure to infections with a predominantly sexual mode of transmission: Secondary | ICD-10-CM | POA: Diagnosis not present

## 2022-11-20 DIAGNOSIS — Z113 Encounter for screening for infections with a predominantly sexual mode of transmission: Secondary | ICD-10-CM

## 2022-11-20 DIAGNOSIS — Z124 Encounter for screening for malignant neoplasm of cervix: Secondary | ICD-10-CM | POA: Insufficient documentation

## 2022-11-20 DIAGNOSIS — Z01419 Encounter for gynecological examination (general) (routine) without abnormal findings: Secondary | ICD-10-CM

## 2022-11-20 NOTE — Progress Notes (Signed)
Obstetrics and Gynecology Annual Patient Evaluation  Appointment Date: 11/20/2022  OBGYN Clinic: [facility]   Primary Care Provider: Olive Bass  Referring Provider: Olive Bass,*  Chief Complaint:  Chief Complaint  Patient presents with   Gynecologic Exam    History of Present Illness: Dominique Webb is a 37 y.o. G1P1001 (No LMP recorded. Patient has had an implant.), seen for the above chief complaint. Her past medical history is significant for nothing   No issues or complaints  Review of Systems: Pertinent items are noted in HPI.   Past Medical History:  Past Medical History:  Diagnosis Date   Medical history non-contributory     Past Surgical History:  Past Surgical History:  Procedure Laterality Date   WISDOM TOOTH EXTRACTION      Past Obstetrical History:  OB History  Gravida Para Term Preterm AB Living  1 1 1     1   SAB IAB Ectopic Multiple Live Births        0 1    # Outcome Date GA Lbr Len/2nd Weight Sex Type Anes PTL Lv  1 Term 08/31/20 [redacted]w[redacted]d 01:39 / 00:32 4 lb 12.4 oz (2.166 kg) F Vag-Spont None  LIV     Birth Comments: wnl    Past Gynecological History: As per HPI. Periods: rare History of Pap Smear(s): Yes.   Last pap 2022, which was NILM/HPV+/16-18-45 negative She is currently using  Nexplanon (placed 08/2020)  for contraception.   Social History:  Social History   Socioeconomic History   Marital status: Single    Spouse name: Not on file   Number of children: Not on file   Years of education: 16   Highest education level: Associate degree: academic program  Occupational History    Comment: [employer]  Tobacco Use   Smoking status: Every Day    Current packs/day: 0.00    Average packs/day: 0.5 packs/day for 18.2 years (9.1 ttl pk-yrs)    Types: Cigarettes    Start date: 2004    Last attempt to quit: 04/07/2020    Years since quitting: 2.6   Smokeless tobacco: Never    Tobacco comments:    pt smokes some days - quit with pregnancy  Vaping Use   Vaping status: Never Used  Substance and Sexual Activity   Alcohol use: Yes    Comment: Social   Drug use: No   Sexual activity: Yes    Birth control/protection: Implant  Other Topics Concern   Not on file  Social History Narrative   Not on file   Social Determinants of Health   Financial Resource Strain: Not on file  Food Insecurity: No Food Insecurity (08/15/2020)   Hunger Vital Sign    Worried About Running Out of Food in the Last Year: Never true    Ran Out of Food in the Last Year: Never true  Transportation Needs: No Transportation Needs (08/15/2020)   PRAPARE - Administrator, Civil Service (Medical): No    Lack of Transportation (Non-Medical): No  Physical Activity: Not on file  Stress: Not on file  Social Connections: Not on file  Intimate Partner Violence: Not on file    Family History:  Family History  Problem Relation Age of Onset   Diabetes Mother    Hypertension Mother    Hyperlipidemia Mother    Stroke Father    Hypertension Father    No FHx of breast cancer  Medications Vanessa Angelina  R. Amacher had no medications administered during this visit. Current Outpatient Medications  Medication Sig Dispense Refill   Etonogestrel (NEXPLANON [place]) Inject into the skin. Placed 2022     Multiple Vitamins-Minerals (HAIR SKIN AND NAILS FORMULA PO) Take by mouth daily.     triamcinolone cream (KENALOG) 0.1 % Apply 1 Application topically 2 (two) times daily. 453.6 g 0   ammonium lactate (AMLACTIN DAILY) 12 % lotion Apply 1 Application topically as needed for dry skin. (Patient not taking: Reported on 11/20/2022) 400 g 0   ondansetron (ZOFRAN-ODT) 4 MG disintegrating tablet Take 1 tablet (4 mg total) by mouth every 8 (eight) hours as needed for nausea or vomiting. (Patient not taking: Reported on 11/20/2022) 10 tablet 0   No current facility-administered medications for this visit.     Allergies Aspirin   Physical Exam:  BP 108/75   Pulse 62   Ht 5\' 4"  (1.626 m)   Wt 117 lb 8 oz (53.3 kg)   BMI 20.17 kg/m  Body mass index is 20.17 kg/m. General appearance: Well nourished, well developed female in no acute distress.  Neck:  Supple, normal appearance, and no thyromegaly  Cardiovascular: normal s1 and s2.  No murmurs, rubs or gallops. Respiratory:  Clear to auscultation bilateral. Normal respiratory effort Abdomen: positive bowel sounds and no masses, hernias; diffusely non tender to palpation, non distended Breasts: declines Neuro/Psych:  Normal mood and affect.  Skin:  Warm and dry.  Lymphatic:  No inguinal lymphadenopathy.   Cervical exam performed in the presence of a chaperone Pelvic exam: is not limited by body habitus EGBUS: within normal limits Vagina: within normal limits and with no blood or discharge in the vault Cervix: normal appearing cervix without tenderness, discharge or lesions. Uterus:  nonenlarged and non tender Adnexa:  normal adnexa and no mass, fullness, tenderness Rectovaginal: deferred  Laboratory: none  Radiology: none  Assessment: patient doing well  Plan:  1. Cervical cancer screening - Cytology - PAP( [place])  2. Screening for STD (sexually transmitted disease) - Cytology - PAP( [place])  3. STD exposure  4. Well woman exam with routine gynecological exam D/w her re: low risk mammogram screening at 40 or 50. Patient happy with nexplanon and likely to get new one next year  RTC 9 months for new nexplanon  Cornelia Copa MD Attending Center for [facility])

## 2022-11-21 LAB — RPR+HBSAG+HCVAB+...
HIV Screen 4th Generation wRfx: NONREACTIVE
Hep C Virus Ab: NONREACTIVE
Hepatitis B Surface Ag: NEGATIVE
RPR Ser Ql: NONREACTIVE

## 2022-11-22 LAB — CYTOLOGY - PAP
Chlamydia: NEGATIVE
Comment: NEGATIVE
Comment: NEGATIVE
Comment: NEGATIVE
Comment: NEGATIVE
Comment: NEGATIVE
Comment: NORMAL
Diagnosis: HIGH — AB
HPV 16: NEGATIVE
HPV 18 / 45: NEGATIVE
High risk HPV: POSITIVE — AB
Neisseria Gonorrhea: NEGATIVE
Trichomonas: NEGATIVE

## 2022-11-25 ENCOUNTER — Encounter: Payer: Self-pay | Admitting: Obstetrics and Gynecology

## 2022-11-25 DIAGNOSIS — R87613 High grade squamous intraepithelial lesion on cytologic smear of cervix (HGSIL): Secondary | ICD-10-CM | POA: Insufficient documentation

## 2023-01-14 ENCOUNTER — Other Ambulatory Visit (HOSPITAL_COMMUNITY)
Admission: RE | Admit: 2023-01-14 | Discharge: 2023-01-14 | Disposition: A | Discharge status: Home / Self Care | Payer: BC Managed Care – PPO | Source: Ambulatory Visit | Attending: Obstetrics and Gynecology | Admitting: Obstetrics and Gynecology

## 2023-01-14 ENCOUNTER — Other Ambulatory Visit: Payer: Self-pay

## 2023-01-14 ENCOUNTER — Ambulatory Visit (INDEPENDENT_AMBULATORY_CARE_PROVIDER_SITE_OTHER): Payer: BC Managed Care – PPO | Admitting: Obstetrics and Gynecology

## 2023-01-14 ENCOUNTER — Ambulatory Visit: Payer: BC Managed Care – PPO | Admitting: Medical

## 2023-01-14 ENCOUNTER — Encounter: Payer: Self-pay | Admitting: Obstetrics and Gynecology

## 2023-01-14 VITALS — BP 107/65 | HR 55 | Wt 123.0 lb

## 2023-01-14 DIAGNOSIS — B3731 Acute candidiasis of vulva and vagina: Secondary | ICD-10-CM | POA: Insufficient documentation

## 2023-01-14 DIAGNOSIS — R87613 High grade squamous intraepithelial lesion on cytologic smear of cervix (HGSIL): Secondary | ICD-10-CM | POA: Insufficient documentation

## 2023-01-14 DIAGNOSIS — N898 Other specified noninflammatory disorders of vagina: Secondary | ICD-10-CM | POA: Insufficient documentation

## 2023-01-14 DIAGNOSIS — Z1331 Encounter for screening for depression: Secondary | ICD-10-CM | POA: Diagnosis not present

## 2023-01-14 HISTORY — PX: COLPOSCOPY W/ BIOPSY / CURETTAGE: SUR283

## 2023-01-14 MED ORDER — METRONIDAZOLE 500 MG PO TABS: 500.0000 mg | ORAL_TABLET | Freq: Two times a day (BID) | ORAL | 0 refills | Status: AC

## 2023-01-14 MED ORDER — FLUCONAZOLE 150 MG PO TABS: 150.0000 mg | ORAL_TABLET | Freq: Once | ORAL | 0 refills | Status: AC

## 2023-01-14 NOTE — Procedures (Signed)
 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  The patient was placed in the dorsal lithotomy position. A Pederson was speculum inserted in the vagina, and the cervix was visualized.  Acetic acid staining was done and the cervix was viewed with green filter; lugol's staining with green filter was also done.  Biopsy 12, 8 and 4 o'clock and then single toothed tenaculum applied and endocervical curettage in all four quadrants done. There was no bleeding after procedure.   Findings: mild awe changes and moderate erythema circumferentially  Adequate: Yes  Specimens: 12, 4 and 8 o'clock cervical biopsies (sent together) and endocervical curettage  Condition: Stable  Complications: None  Plan: The patient was advised to call for any fever or for prolonged or severe pain or bleeding. She was advised to use OTC analgesics as needed for mild to moderate pain. She was advised to avoid vaginal intercourse for 48 hours or until the bleeding has completely stopped.   Bebe Izell Raddle MD Attending Center for [employer] [facility])

## 2023-01-15 LAB — CERVICOVAGINAL ANCILLARY ONLY
Bacterial Vaginitis (gardnerella): NEGATIVE
Candida Glabrata: NEGATIVE
Candida Vaginitis: NEGATIVE
Chlamydia: NEGATIVE
Comment: NEGATIVE
Comment: NEGATIVE
Comment: NEGATIVE
Comment: NEGATIVE
Comment: NEGATIVE
Comment: NORMAL
Neisseria Gonorrhea: NEGATIVE
Trichomonas: NEGATIVE

## 2023-01-16 LAB — SURGICAL PATHOLOGY

## 2023-01-21 ENCOUNTER — Ambulatory Visit (INDEPENDENT_AMBULATORY_CARE_PROVIDER_SITE_OTHER): Payer: 59 | Admitting: Family Medicine

## 2023-01-21 ENCOUNTER — Encounter: Payer: Self-pay | Admitting: Family Medicine

## 2023-01-21 ENCOUNTER — Ambulatory Visit: Payer: BC Managed Care – PPO | Admitting: Family

## 2023-01-21 VITALS — BP 112/69 | HR 54 | Ht 64.0 in | Wt 120.0 lb

## 2023-01-21 DIAGNOSIS — R21 Rash and other nonspecific skin eruption: Secondary | ICD-10-CM | POA: Diagnosis not present

## 2023-01-21 DIAGNOSIS — J019 Acute sinusitis, unspecified: Secondary | ICD-10-CM | POA: Diagnosis not present

## 2023-01-21 DIAGNOSIS — B9689 Other specified bacterial agents as the cause of diseases classified elsewhere: Secondary | ICD-10-CM | POA: Diagnosis not present

## 2023-01-21 MED ORDER — AMOXICILLIN-POT CLAVULANATE 875-125 MG PO TABS: 1.0000 | ORAL_TABLET | Freq: Two times a day (BID) | ORAL | 0 refills | Status: AC

## 2023-01-21 MED ORDER — CLOTRIMAZOLE 1 % EX CREA: 1.0000 | TOPICAL_CREAM | Freq: Two times a day (BID) | CUTANEOUS | 0 refills | Status: DC

## 2023-01-21 NOTE — Progress Notes (Signed)
 Acute Office Visit  Subjective:     Patient ID: Dominique Webb, female    DOB: Dec 07, 1985, 38 y.o.   MRN: [id number]  Chief Complaint  Patient presents with   Nasal Congestion    HPI Patient is in today for URI symptoms.   Discussed the use of AI scribe software for clinical note transcription with the patient, who gave verbal consent to proceed.  History of Present Illness   The patient presents with a three-week history of persistent upper respiratory symptoms. Initially, the symptoms were suggestive of a common cold, including rhinorrhea, sneezing, sore throat, and cough. Despite self-medication with over-the-counter flu remedies, the symptoms persisted. The patient reports ongoing cough productive of phlegm and nasal congestion, but denies any recent headaches. There is no associated dyspnea, wheezing, or chest pain.  The patient also reports a longstanding issue with their foot, which has been previously evaluated by a podiatrist. Despite treatment with ammonium lactate  cream, the patient continues to experience discomfort, describing it as walking on knives. The affected area is itchy, especially after standing all day, and has developed an unpleasant odor. The patient denies any recent changes in color or temperature of the affected area. Reports it will get red at times.   The patient's daughter was recently diagnosed with RSV, raising the possibility that the patient's respiratory symptoms could be due to the same viral infection. However, the patient did not undergo testing for RSV or COVID-19 at the onset of their symptoms. Despite the ongoing symptoms, the patient's appetite remains good.             ROS All review of systems negative except what is listed in the HPI      Objective:    BP 112/69   Pulse (!) 54   Ht 5' 4 (1.626 m)   Wt 120 lb (54.4 kg)   LMP 12/18/2022   SpO2 100%   BMI 20.60 kg/m    Physical Exam Vitals reviewed.  Constitutional:       Appearance: Normal appearance.  HENT:     Head: Normocephalic and atraumatic.     Nose: Congestion and rhinorrhea present.     Mouth/Throat:     Pharynx: Oropharynx is clear. No posterior oropharyngeal erythema.     Comments: Cobblestoning  Eyes:     Conjunctiva/sclera: Conjunctivae normal.  Cardiovascular:     Rate and Rhythm: Normal rate and regular rhythm.  Pulmonary:     Effort: Pulmonary effort is normal.     Breath sounds: Normal breath sounds. No wheezing, rhonchi or rales.  Musculoskeletal:     Cervical back: Normal range of motion.  Skin:    General: Skin is warm and dry.     Comments: Right plantar foot rash and dry skin, minimal erythema, some flaking and calloused skin  Neurological:     Mental Status: She is alert and oriented to person, place, and time.  Psychiatric:        Mood and Affect: Mood normal.        Behavior: Behavior normal.        Thought Content: Thought content normal.        Judgment: Judgment normal.         No results found for any visits on 01/21/23.      Assessment & Plan:   Problem List Items Addressed This Visit   None Visit Diagnoses       Acute bacterial rhinosinusitis    -  Primary  Persistent symptoms of congestion and cough for three weeks, initially presenting as a cold. Likely secondary to a viral infection, possibly RSV as the patient's daughter was diagnosed with it. No improvement with over-the-counter flu medications. -Prescribe Augmentin , take with food and water to prevent stomach upset. -Declined needing cough medicine at this time.  -Continue supportive measures including rest, hydration, humidifier use, steam showers, warm compresses to sinuses, warm liquids with lemon and honey, and over-the-counter cough, cold, and analgesics as needed.    Relevant Medications   amoxicillin -clavulanate (AUGMENTIN ) 875-125 MG tablet     Rash of foot     Complaints of pain described as walking on knives, with associated redness,  itching and odor. Previous treatment with ammonium lactate  cream was ineffective. -Trial antifungal cream. -If no improvement, recommend follow-up with a podiatrist    Relevant Medications   clotrimazole  (LOTRIMIN ) 1 % cream       Meds ordered this encounter  Medications   clotrimazole  (LOTRIMIN ) 1 % cream    Sig: Apply 1 Application topically 2 (two) times daily.    Dispense:  30 g    Refill:  0    Supervising Provider:   DOMENICA BLACKBIRD A [[id number]]   amoxicillin -clavulanate (AUGMENTIN ) 875-125 MG tablet    Sig: Take 1 tablet by mouth 2 (two) times daily for 7 days.    Dispense:  14 tablet    Refill:  0    Supervising Provider:   DOMENICA BLACKBIRD A [[id number]]    Return if symptoms worsen or fail to improve.  Waddell KATHEE Mon, NP

## 2023-01-24 ENCOUNTER — Encounter: Payer: Self-pay | Admitting: Obstetrics and Gynecology

## 2023-02-19 ENCOUNTER — Ambulatory Visit (INDEPENDENT_AMBULATORY_CARE_PROVIDER_SITE_OTHER): Payer: 59 | Admitting: Obstetrics and Gynecology

## 2023-02-19 ENCOUNTER — Other Ambulatory Visit: Payer: Self-pay

## 2023-02-19 ENCOUNTER — Encounter: Payer: Self-pay | Admitting: Obstetrics and Gynecology

## 2023-02-19 ENCOUNTER — Other Ambulatory Visit (HOSPITAL_COMMUNITY)
Admission: RE | Admit: 2023-02-19 | Discharge: 2023-02-19 | Disposition: A | Discharge status: Home / Self Care | Payer: 59 | Source: Ambulatory Visit | Attending: Obstetrics and Gynecology | Admitting: Obstetrics and Gynecology

## 2023-02-19 VITALS — BP 107/66 | HR 65 | Ht 62.0 in | Wt 124.3 lb

## 2023-02-19 DIAGNOSIS — R87613 High grade squamous intraepithelial lesion on cytologic smear of cervix (HGSIL): Secondary | ICD-10-CM | POA: Diagnosis present

## 2023-02-19 HISTORY — PX: LEEP: PRO1013

## 2023-02-19 NOTE — Procedures (Signed)
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  The patient was placed in the dorsal lithotomy position. A coated pederson speculum inserted in the vagina, and the cervix was visualized.  A colposcopy was performed with acetic acid and lugol's staining, with the below noted findings. The cervical stromal bed was injected with 10mL of 1% lidocaine with epinephrine. Entering at 12 o'clock on the cervix and using a large shallow Fischer Loop and a setting of 50/50 blend current, a LEEP was done, in one circumferential fashion.  Next, an ECC was done and hemostasis achieved with the ball electrode on 50 coagulation current and application of Monsel's. On coagulation, the entire LEEP bed and surgical margins were thoroughly cauterized  Unforunately, at one point the patient jumped and the right introitus at 7 o'clock was burned on 50/50 blend. This spot was focal.   Findings: diffuse AWE changes and increased vascularity at  circumferentially  Adequate: Yes  Specimens: LEEP specimen (circumferential, 360 degrees) and ECC  Condition: Stable  Complications: None  Plan: The patient was advised to call for any fever or for prolonged or severe pain or bleeding. She was advised to use OTC analgesics as needed for mild to moderate pain. Pelvic rest was advised until after her four week post operative visit.    Cornelia Copa MD Attending [facility])

## 2023-02-21 LAB — SURGICAL PATHOLOGY

## 2023-02-24 ENCOUNTER — Encounter: Payer: Self-pay | Admitting: Obstetrics and Gynecology

## 2023-03-10 ENCOUNTER — Ambulatory Visit: Payer: 59 | Admitting: Obstetrics and Gynecology

## 2023-04-14 ENCOUNTER — Encounter: Payer: Self-pay | Admitting: Obstetrics and Gynecology

## 2023-04-14 ENCOUNTER — Ambulatory Visit: Admitting: Obstetrics and Gynecology

## 2023-04-14 ENCOUNTER — Other Ambulatory Visit: Payer: Self-pay

## 2023-04-14 VITALS — BP 117/69 | HR 70 | Wt 123.6 lb

## 2023-04-14 DIAGNOSIS — N870 Mild cervical dysplasia: Secondary | ICD-10-CM

## 2023-04-14 NOTE — Progress Notes (Unsigned)
 Obstetrics and Gynecology Visit Return Patient Evaluation  Appointment Date: 04/14/2023  Primary Care Provider: Olive Bass  OBGYN Clinic: [facility]  Chief Complaint: follow up  History of Present Illness:  Dominique Webb is a 38 y.o. 2/12 LEEP and ECC. Final pathology came back CIN1 with negative margins and negative ECC; prior pap was HSIL/HPV+, 16/18/45 negative and colpo (adequate) was CIN1 biopsies with negative ECC)  Interval History: Since that time, she states that she is doing well and no problems or issues; she has a period that is finishing up and no problems   Review of Systems: as noted in the History of Present Illness.  Patient Active Problem List   Diagnosis Date Noted   HSIL (high grade squamous intraepithelial lesion) on Pap smear of cervix 11/25/2022   Nexplanon in place 09/19/2016   Medications:  Vanessa Petrey R. Roskos had no medications administered during this visit. Current Outpatient Medications  Medication Sig Dispense Refill   clotrimazole (LOTRIMIN) 1 % cream Apply 1 Application topically 2 (two) times daily. 30 g 0   Etonogestrel (NEXPLANON [place]) Inject into the skin. Placed 2022     Multiple Vitamins-Minerals (HAIR SKIN AND NAILS FORMULA PO) Take by mouth daily.     No current facility-administered medications for this visit.    Allergies: is allergic to aspirin.  Physical Exam:  BP 117/69   Pulse 70   Wt 123 lb 9.6 oz (56.1 kg)   BMI 22.61 kg/m  Body mass index is 22.61 kg/m. General appearance: Well nourished, well developed female in no acute distress.  Abdomen: diffusely non tender to palpation, non distended, and no masses, hernias Neuro/Psych:  Normal mood and affect.    Pelvic exam:  Cervical exam performed in the presence of a chaperone EGBUS: wnl Vaginal vault: scant old blood in the vault Cervix:  wnl, well healed, can't even tell she had surgery Bimanual: deferred  Assessment:  patient doing well   Plan: Okay to come off pelvic rest. Recommend repeat pap and HPV in six months. Nexplanon can come out then (placed Aug 2022)  I contacted the pathologist to check pap and colpo samples and I'm still awaiting a response.  Return in about 6 months (around 10/14/2023) for in person, md or app.  No future appointments.  Cornelia Copa MD Attending [facility])

## 2023-04-22 ENCOUNTER — Other Ambulatory Visit: Payer: Self-pay | Admitting: Family

## 2023-04-22 DIAGNOSIS — Z1231 Encounter for screening mammogram for malignant neoplasm of breast: Secondary | ICD-10-CM

## 2023-04-28 ENCOUNTER — Ambulatory Visit
Admission: RE | Admit: 2023-04-28 | Discharge: 2023-04-28 | Disposition: A | Discharge status: Home / Self Care | Source: Ambulatory Visit | Attending: Family | Admitting: Family

## 2023-04-28 DIAGNOSIS — Z1231 Encounter for screening mammogram for malignant neoplasm of breast: Secondary | ICD-10-CM

## 2023-05-02 ENCOUNTER — Other Ambulatory Visit: Payer: Self-pay | Admitting: Family

## 2023-05-02 DIAGNOSIS — R928 Other abnormal and inconclusive findings on diagnostic imaging of breast: Secondary | ICD-10-CM

## 2023-05-07 ENCOUNTER — Ambulatory Visit
Admission: RE | Admit: 2023-05-07 | Discharge: 2023-05-07 | Disposition: A | Discharge status: Home / Self Care | Source: Ambulatory Visit | Attending: Family | Admitting: Family

## 2023-05-07 ENCOUNTER — Ambulatory Visit

## 2023-05-07 DIAGNOSIS — R928 Other abnormal and inconclusive findings on diagnostic imaging of breast: Secondary | ICD-10-CM

## 2023-05-19 ENCOUNTER — Emergency Department (HOSPITAL_COMMUNITY)
Admission: EM | Admit: 2023-05-19 | Discharge: 2023-05-19 | Disposition: A | Discharge status: Home / Self Care | Attending: Emergency Medicine | Admitting: Emergency Medicine

## 2023-05-19 ENCOUNTER — Other Ambulatory Visit: Payer: Self-pay

## 2023-05-19 ENCOUNTER — Encounter (HOSPITAL_COMMUNITY): Payer: Self-pay

## 2023-05-19 DIAGNOSIS — T148XXA Other injury of unspecified body region, initial encounter: Secondary | ICD-10-CM

## 2023-05-19 DIAGNOSIS — X58XXXA Exposure to other specified factors, initial encounter: Secondary | ICD-10-CM | POA: Diagnosis not present

## 2023-05-19 DIAGNOSIS — M546 Pain in thoracic spine: Secondary | ICD-10-CM

## 2023-05-19 DIAGNOSIS — S29012A Strain of muscle and tendon of back wall of thorax, initial encounter: Secondary | ICD-10-CM | POA: Insufficient documentation

## 2023-05-19 DIAGNOSIS — S161XXA Strain of muscle, fascia and tendon at neck level, initial encounter: Secondary | ICD-10-CM | POA: Insufficient documentation

## 2023-05-19 DIAGNOSIS — S199XXA Unspecified injury of neck, initial encounter: Secondary | ICD-10-CM | POA: Diagnosis present

## 2023-05-19 MED ORDER — LIDOCAINE 5 % EX PTCH
2.0000 | MEDICATED_PATCH | CUTANEOUS | Status: DC
  Administered 2023-05-19: 2 via TRANSDERMAL
  Filled 2023-05-19: qty 2

## 2023-05-19 MED ORDER — NAPROXEN 500 MG PO TABS: 500.0000 mg | ORAL_TABLET | Freq: Two times a day (BID) | ORAL | 0 refills | Status: DC

## 2023-05-19 MED ORDER — NAPROXEN 250 MG PO TABS
500.0000 mg | ORAL_TABLET | Freq: Once | ORAL | Status: AC
  Administered 2023-05-19: 500 mg via ORAL
  Filled 2023-05-19: qty 2

## 2023-05-19 MED ORDER — LIDOCAINE 5 % EX PTCH: 1.0000 | MEDICATED_PATCH | CUTANEOUS | 0 refills | Status: DC

## 2023-05-19 MED ORDER — CYCLOBENZAPRINE HCL 10 MG PO TABS: 10.0000 mg | ORAL_TABLET | Freq: Two times a day (BID) | ORAL | 0 refills | Status: DC | PRN

## 2023-05-19 NOTE — Discharge Instructions (Addendum)
 Thank for letting us  evaluate you today.  It seems you have a muscle strain.  I provided you with naproxen /anti-inflammatory and Flexeril  and lidocaine  patch prescription for your pain.  You may use naproxen  and Tylenol  intermittently every 8 hours as needed for pain.  Please do not use naproxen  with aspirin, Aleve , ibuprofen , Advil  as they are all in the same family. flexeril  may cause drowsiness so do not operate heavy machinery including driving or drink alcohol with this.  You may take this at night or split the tablet in half if it makes you too drowsy.If the lidocaine  patches are not covered you can get these over-the-counter.  You may also pick up Voltaren gel which is also of the over-the-counter for topical relief.  I recommend doing rest, ice or heat for areas of pain.  Please avoid lifting heavy items greater than 5 pounds over the next 1-2 weeks.  Follow-up with primary care provider symptoms do not prove within the next 1-2 weeks with medication and symptomatic care at home  Return to emergency department if you experience urinary incontinence, loss of sensation in genital region, significant worsening of pain, numbness or tingling to 1 side of your body, weakness of 1 side your body

## 2023-05-19 NOTE — ED Triage Notes (Addendum)
 Pt came in via POV d/t mid-back pain the last 2 months, states it started out intermittently feeling like spasms but now its more of a throbbing pain. A/Ox4, rates her pain 7/10 during triage. 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

## 2023-05-19 NOTE — ED Provider Notes (Signed)
 [place] EMERGENCY DEPARTMENT AT [facility] Provider Note   CSN: [id number] Arrival date & time: 05/19/23  1423     History  Chief Complaint  Patient presents with   Back Pain    Dominique Webb is a 38 y.o. female with no pertinent past medical history presents emergency department for evaluation of neck and mid back pain for the past 2 months.  She has been having intermittent "spasms" however today it has been more constant.  She does not recall a specific instance where she strained her back however lifts very heavy objects at work every day.  She took one of her sisters muscle relaxers which significantly improved symptoms.  Has not tried other OTC medications at home.  Denies low back pain, history of IVDU, malignancy, saddle paresthesia, urinary incontinence, urinary symptoms, nor fevers   Back Pain      Home Medications Prior to Admission medications   Medication Sig Start Date End Date Taking? Authorizing Provider  cyclobenzaprine  (FLEXERIL ) 10 MG tablet Take 1 tablet (10 mg total) by mouth 2 (two) times daily as needed for muscle spasms. 05/19/23  Yes Royann Cords, PA  lidocaine  (LIDODERM ) 5 % Place 1 patch onto the skin daily. Remove & Discard patch within 12 hours or as directed by MD 05/19/23  Yes Royann Cords, PA  naproxen  (NAPROSYN ) 500 MG tablet Take 1 tablet (500 mg total) by mouth 2 (two) times daily. 05/19/23  Yes Royann Cords, PA  clotrimazole  (LOTRIMIN ) 1 % cream Apply 1 Application topically 2 (two) times daily. 01/21/23   Everlina Hock, NP  Etonogestrel  (NEXPLANON  [place]) Inject into the skin. Placed 2022    [provider]  Multiple Vitamins-Minerals (HAIR SKIN AND NAILS FORMULA PO) Take by mouth daily.    [provider]      Allergies    Aspirin    Review of Systems   Review of Systems  Musculoskeletal:  Positive for back pain.    Physical Exam Updated Vital Signs BP 114/62 (BP Location: Right Arm)   Pulse (!) 52    Temp 98.3 F (36.8 C)   Resp 17   Ht 5\' 2"  (1.575 m)   Wt 55.8 kg   SpO2 100%   BMI 22.50 kg/m  Physical Exam Vitals and nursing note reviewed.  Constitutional:      General: She is not in acute distress.    Appearance: Normal appearance.  HENT:     Head: Normocephalic and atraumatic.  Eyes:     Conjunctiva/sclera: Conjunctivae normal.  Cardiovascular:     Rate and Rhythm: Normal rate.  Pulmonary:     Effort: Pulmonary effort is normal. No respiratory distress.  Musculoskeletal:       Arms:     Comments: TTP of paraspinous musculature in thoracic region, left cervical paraspinous musculature.  Pain worsens with bending and twisting and lateral flexion of neck.  No bony tenderness  Skin:    Coloration: Skin is not jaundiced or pale.  Neurological:     Mental Status: She is alert. Mental status is at baseline.     Comments: Sensation 2/2 of BUE and BLE.  Grip strength equal bilaterally.  No pronator drift.  Motor 5/5 of BUE and BLE.  Commands appropriately.  Ambulates without difficulty.     ED Results / Procedures / Treatments   Labs (all labs ordered are listed, but only abnormal results are displayed) Labs Reviewed - No data to display  EKG None  Radiology No results found.  Procedures Procedures    Medications Ordered in ED Medications  naproxen  (NAPROSYN ) tablet 500 mg (has no administration in time range)  lidocaine  (LIDODERM ) 5 % 2 patch (has no administration in time range)    ED Course/ Medical Decision Making/ A&P                                 Medical Decision Making Risk Prescription drug management.   Patient presents to the ED for concern of neck and back pain, this involves an extensive number of treatment options, and is a complaint that carries with it a high risk of complications and morbidity.  The differential diagnosis includes cervical radiculopathy, spinal radiculopathy, CVA/TIA, muscle strain, MS, cauda equina, pyelonephritis,  UTI, kidney stone, epidural abscess   Co morbidities that complicate the patient evaluation  Physically demanding job   Additional history obtained:  Additional history obtained from Nursing   External records from outside source obtained and reviewed including triage RN note    Medicines ordered and prescription drug management:  I ordered medication including naproxen , Flexeril , lidocaine  patches for muscle strain, pain Reevaluation of the patient after these medicines showed that the patient improved I have reviewed the patients home medicines and have made adjustments as needed     Problem List / ED Course:  Muscle strain Bilateral thoracic back pain No alarm symptoms to indicate cauda equina or other emergent pathology No urinary symptoms to indicate kidney stone, UTI, pyelonephritis No fever nor tachycardia in ED Tender to paraspinous musculature bilaterally in thoracic region as well as left paraspinous musculature in cervical region.  Neurologically intact with no weakness, numbness, paresthesia. Will provide Flexeril , naproxen , lidocaine  patches as well as OTC symptomatic care at home for symptoms. As she is driving home, did not provide Flexeril  in ED.  Provided prescription. Discussed precautions/medication interactions regarding Flexeril  and naproxen  use Provided work note so patient does not lift for next week Discussed patient follow-up with PCP in 1-2 weeks if symptoms do not improve All recommendations, ED workup noted on DC paperwork   Reevaluation:  After the interventions noted above, I reevaluated the patient and found that they have :improved     Dispostion:  After consideration of the diagnostic results and the patients response to treatment, I feel that the patent would benefit from outpatient management of symptomatic care, prescription medication for analgesia and muscle strain.  Discussed ED workup, disposition, return to ED precautions  with patient who expresses understanding agrees with plan.  All questions answered to their satisfaction.  They are agreeable to plan.  Discharge instructions provided on paperwork  Final Clinical Impression(s) / ED Diagnoses Final diagnoses:  Muscle strain  Bilateral thoracic back pain, unspecified chronicity    Rx / DC Orders ED Discharge Orders          Ordered    naproxen  (NAPROSYN ) 500 MG tablet  2 times daily        05/19/23 1754    lidocaine  (LIDODERM ) 5 %  Every 24 hours        05/19/23 1754    cyclobenzaprine  (FLEXERIL ) 10 MG tablet  2 times daily PRN        05/19/23 1754              Royann Cords, PA 05/19/23 1803    Ninetta Basket, MD 05/20/23 [phone]

## 2023-05-23 ENCOUNTER — Telehealth: Payer: Self-pay | Admitting: Family

## 2023-05-23 NOTE — Telephone Encounter (Signed)
Form has been placed in PCP folder for review.

## 2023-05-23 NOTE — Telephone Encounter (Signed)
 Pt dropped off paperwork. She has an appt 5/20.

## 2023-05-24 ENCOUNTER — Emergency Department (HOSPITAL_COMMUNITY)

## 2023-05-24 ENCOUNTER — Other Ambulatory Visit: Payer: Self-pay

## 2023-05-24 ENCOUNTER — Emergency Department (HOSPITAL_COMMUNITY)
Admission: EM | Admit: 2023-05-24 | Discharge: 2023-05-24 | Disposition: A | Discharge status: Home / Self Care | Attending: Emergency Medicine | Admitting: Emergency Medicine

## 2023-05-24 ENCOUNTER — Encounter (HOSPITAL_COMMUNITY): Payer: Self-pay

## 2023-05-24 DIAGNOSIS — W108XXA Fall (on) (from) other stairs and steps, initial encounter: Secondary | ICD-10-CM | POA: Diagnosis not present

## 2023-05-24 DIAGNOSIS — M62830 Muscle spasm of back: Secondary | ICD-10-CM | POA: Insufficient documentation

## 2023-05-24 DIAGNOSIS — W19XXXA Unspecified fall, initial encounter: Secondary | ICD-10-CM

## 2023-05-24 DIAGNOSIS — R519 Headache, unspecified: Secondary | ICD-10-CM | POA: Insufficient documentation

## 2023-05-24 DIAGNOSIS — R001 Bradycardia, unspecified: Secondary | ICD-10-CM | POA: Insufficient documentation

## 2023-05-24 DIAGNOSIS — R0789 Other chest pain: Secondary | ICD-10-CM | POA: Insufficient documentation

## 2023-05-24 DIAGNOSIS — M542 Cervicalgia: Secondary | ICD-10-CM | POA: Diagnosis not present

## 2023-05-24 DIAGNOSIS — M62838 Other muscle spasm: Secondary | ICD-10-CM

## 2023-05-24 LAB — PROTIME-INR
INR: 1 (ref 0.8–1.2)
Prothrombin Time: 13.9 s (ref 11.4–15.2)

## 2023-05-24 LAB — COMPREHENSIVE METABOLIC PANEL WITH GFR
ALT: 20 U/L (ref 0–44)
AST: 34 U/L (ref 15–41)
Albumin: 4 g/dL (ref 3.5–5.0)
Alkaline Phosphatase: 45 U/L (ref 38–126)
Anion gap: 13 (ref 5–15)
BUN: 10 mg/dL (ref 6–20)
CO2: 18 mmol/L — ABNORMAL LOW (ref 22–32)
Calcium: 8.8 mg/dL — ABNORMAL LOW (ref 8.9–10.3)
Chloride: 107 mmol/L (ref 98–111)
Creatinine, Ser: 0.8 mg/dL (ref 0.44–1.00)
GFR, Estimated: >60 mL/min (ref >60)
Glucose, Bld: 101 mg/dL — ABNORMAL HIGH (ref 70–99)
Potassium: 4.1 mmol/L (ref 3.5–5.1)
Sodium: 138 mmol/L (ref 135–145)
Total Bilirubin: 0.7 mg/dL (ref 0.0–1.2)
Total Protein: 6.8 g/dL (ref 6.5–8.1)

## 2023-05-24 LAB — CBC
HCT: 40.9 % (ref 36.0–46.0)
Hemoglobin: 13.4 g/dL (ref 12.0–15.0)
MCH: 32.8 pg (ref 26.0–34.0)
MCHC: 32.8 g/dL (ref 30.0–36.0)
MCV: 100.2 fL — ABNORMAL HIGH (ref 80.0–100.0)
Platelets: 192 10*3/uL (ref 150–400)
RBC: 4.08 MIL/uL (ref 3.87–5.11)
RDW: 12.9 % (ref 11.5–15.5)
WBC: 13.8 10*3/uL — ABNORMAL HIGH (ref 4.0–10.5)
nRBC: 0 % (ref 0.0–0.2)

## 2023-05-24 LAB — HCG, SERUM, QUALITATIVE: Preg, Serum: NEGATIVE

## 2023-05-24 LAB — I-STAT CHEM 8, ED
BUN: 11 mg/dL (ref 6–20)
Calcium, Ion: 1.11 mmol/L — ABNORMAL LOW (ref 1.15–1.40)
Chloride: 110 mmol/L (ref 98–111)
Creatinine, Ser: 0.9 mg/dL (ref 0.44–1.00)
Glucose, Bld: 101 mg/dL — ABNORMAL HIGH (ref 70–99)
HCT: 41 % (ref 36.0–46.0)
Hemoglobin: 13.9 g/dL (ref 12.0–15.0)
Potassium: 4.4 mmol/L (ref 3.5–5.1)
Sodium: 142 mmol/L (ref 135–145)
TCO2: 21 mmol/L — ABNORMAL LOW (ref 22–32)

## 2023-05-24 LAB — SAMPLE TO BLOOD BANK

## 2023-05-24 LAB — I-STAT CG4 LACTIC ACID, ED: Lactic Acid, Venous: 1.6 mmol/L (ref 0.5–1.9)

## 2023-05-24 LAB — ETHANOL: Alcohol, Ethyl (B): 50 mg/dL — ABNORMAL HIGH (ref <15)

## 2023-05-24 MED ORDER — FENTANYL CITRATE PF 50 MCG/ML IJ SOSY
50.0000 ug | PREFILLED_SYRINGE | Freq: Once | INTRAMUSCULAR | Status: AC
  Administered 2023-05-24: 50 ug via INTRAVENOUS
  Filled 2023-05-24: qty 1

## 2023-05-24 MED ORDER — IOHEXOL 350 MG/ML SOLN
75.0000 mL | Freq: Once | INTRAVENOUS | Status: AC | PRN
  Administered 2023-05-24: 75 mL via INTRAVENOUS

## 2023-05-24 MED ORDER — CYCLOBENZAPRINE HCL 10 MG PO TABS: 10.0000 mg | ORAL_TABLET | Freq: Two times a day (BID) | ORAL | 0 refills | Status: DC | PRN

## 2023-05-24 MED ORDER — SODIUM CHLORIDE 0.9 % IV BOLUS
1000.0000 mL | Freq: Once | INTRAVENOUS | Status: AC
  Administered 2023-05-24: 1000 mL via INTRAVENOUS

## 2023-05-24 MED ORDER — LIDOCAINE 5 % EX PTCH: 1.0000 | MEDICATED_PATCH | CUTANEOUS | 0 refills | Status: DC

## 2023-05-24 NOTE — ED Notes (Addendum)
Patient brought back from CT.

## 2023-05-24 NOTE — Discharge Instructions (Signed)
 Your history, exam, workup today are consistent with soft tissue muscle skeletal pains and injuries after the fall down the stairs.  Luckily the CT imaging was overall reassuring.  Please follow-up with your primary doctor for outpatient management and please use the medicines to help with discomfort and spasm.  If any symptoms change or worsen acutely, please turn to the nearest Emergency Department.

## 2023-05-24 NOTE — ED Notes (Signed)
Help get patient into a gown on the monitor patient is resting with call bell in reach

## 2023-05-24 NOTE — ED Triage Notes (Signed)
 Pt BIB [facility] from home c/o a fall that happened last night. Pt was drinking alcohol and fell down the stairs from the 2nd floor. Pt woke up on the couch this morning unable to move. Pt is c/o lower back pain, CP and neck pain. She received 100 mcg of Fentanyl 

## 2023-05-24 NOTE — ED Provider Notes (Signed)
 [place] EMERGENCY DEPARTMENT AT [facility] Provider Note   CSN: [id number] Arrival date & time: 05/24/23  4403     History  Chief Complaint  Patient presents with   [place]    Dominique Webb is a 38 y.o. female.  The history is provided by the patient and medical records. No language interpreter was used.  Fall This is a new problem. The current episode started 6 to 12 hours ago. The problem occurs rarely. The problem has not changed since onset.Associated symptoms include chest pain, abdominal pain (L flank) and headaches. Pertinent negatives include no shortness of breath. Nothing aggravates the symptoms. Nothing relieves the symptoms. She has tried nothing for the symptoms. The treatment provided no relief.       Home Medications Prior to Admission medications   Medication Sig Start Date End Date Taking? Authorizing Provider  clotrimazole  (LOTRIMIN ) 1 % cream Apply 1 Application topically 2 (two) times daily. 01/21/23   Everlina Hock, NP  cyclobenzaprine  (FLEXERIL ) 10 MG tablet Take 1 tablet (10 mg total) by mouth 2 (two) times daily as needed for muscle spasms. 05/19/23   Royann Cords, PA  Etonogestrel  (NEXPLANON  ) Inject into the skin. Placed 2022    [provider]  lidocaine  (LIDODERM ) 5 % Place 1 patch onto the skin daily. Remove & Discard patch within 12 hours or as directed by MD 05/19/23   Royann Cords, PA  Multiple Vitamins-Minerals (HAIR SKIN AND NAILS FORMULA PO) Take by mouth daily.    [provider]  naproxen  (NAPROSYN ) 500 MG tablet Take 1 tablet (500 mg total) by mouth 2 (two) times daily. 05/19/23   Royann Cords, PA      Allergies    Aspirin    Review of Systems   Review of Systems  Constitutional:  Negative for chills, fatigue and fever.  HENT:  Negative for congestion.   Eyes:  Negative for visual disturbance.  Respiratory:  Positive for chest tightness. Negative for cough, shortness of breath and wheezing.    Cardiovascular:  Positive for chest pain. Negative for palpitations and leg swelling.  Gastrointestinal:  Positive for abdominal pain (L flank). Negative for constipation, diarrhea, nausea and vomiting.  Genitourinary:  Positive for flank pain. Negative for dysuria.  Musculoskeletal:  Positive for back pain and neck pain. Negative for neck stiffness.  Skin:  Negative for pallor and rash.  Neurological:  Positive for headaches.  Psychiatric/Behavioral:  Negative for agitation and confusion.   All other systems reviewed and are negative.   Physical Exam Updated Vital Signs BP 107/71 (BP Location: Right Arm)   Pulse (!) 51   Temp 98.2 F (36.8 C) (Oral)   Resp 19   SpO2 100%  Physical Exam Vitals and nursing note reviewed.  Constitutional:      General: She is not in acute distress.    Appearance: She is well-developed. She is not ill-appearing, toxic-appearing or diaphoretic.  HENT:     Head: Normocephalic and atraumatic.     Nose: No congestion or rhinorrhea.     Mouth/Throat:     Mouth: Mucous membranes are dry.     Pharynx: No oropharyngeal exudate or posterior oropharyngeal erythema.  Eyes:     Extraocular Movements: Extraocular movements intact.     Conjunctiva/sclera: Conjunctivae normal.     Pupils: Pupils are equal, round, and reactive to light.  Cardiovascular:     Rate and Rhythm: Regular rhythm. Bradycardia present.  Heart sounds: No murmur heard. Pulmonary:     Effort: Pulmonary effort is normal. No respiratory distress.     Breath sounds: Normal breath sounds. No wheezing, rhonchi or rales.  Chest:     Chest wall: Tenderness present.  Abdominal:     General: Abdomen is flat.     Palpations: Abdomen is soft.     Tenderness: There is no abdominal tenderness. There is left CVA tenderness.  Musculoskeletal:        General: Tenderness present. No swelling.     Cervical back: Neck supple. Tenderness present.  Skin:    General: Skin is warm and dry.      Capillary Refill: Capillary refill takes less than 2 seconds.     Findings: No erythema.  Neurological:     General: No focal deficit present.     Mental Status: She is alert.     Sensory: No sensory deficit.     Motor: No weakness.  Psychiatric:        Mood and Affect: Mood normal.     ED Results / Procedures / Treatments   Labs (all labs ordered are listed, but only abnormal results are displayed) Labs Reviewed  COMPREHENSIVE METABOLIC PANEL WITH GFR - Abnormal; Notable for the following components:      Result Value   CO2 18 (*)    Glucose, Bld 101 (*)    Calcium  8.8 (*)    All other components within normal limits  CBC - Abnormal; Notable for the following components:   WBC 13.8 (*)    MCV 100.2 (*)    All other components within normal limits  ETHANOL - Abnormal; Notable for the following components:   Alcohol, Ethyl (B) 50 (*)    All other components within normal limits  I-STAT CHEM 8, ED - Abnormal; Notable for the following components:   Glucose, Bld 101 (*)    Calcium , Ion 1.11 (*)    TCO2 21 (*)    All other components within normal limits  PROTIME-INR  HCG, SERUM, QUALITATIVE  URINALYSIS, ROUTINE W REFLEX MICROSCOPIC  I-STAT CG4 LACTIC ACID, ED  SAMPLE TO BLOOD BANK    EKG EKG Interpretation Date/Time:  Saturday May 24 2023 09:11:19 EDT Ventricular Rate:  52 PR Interval:  137 QRS Duration:  71 QT Interval:  419 QTC Calculation: 390 R Axis:   77  Text Interpretation: Sinus rhythm ST elev, probable normal early repol pattern when compared to prior, slower rate No STEMI Confirmed by Wynell Heath ([id number]) on 05/24/2023 9:32:26 AM  Radiology CT HEAD WO CONTRAST Result Date: 05/24/2023 CLINICAL DATA:  Provided history: Polytrauma, blunt. Head trauma, moderate/severe. EXAM: CT HEAD WITHOUT CONTRAST CT CERVICAL SPINE WITHOUT CONTRAST TECHNIQUE: Multidetector CT imaging of the head and cervical spine was performed following the standard protocol without  intravenous contrast. Multiplanar CT image reconstructions of the cervical spine were also generated. RADIATION DOSE REDUCTION: This exam was performed according to the departmental dose-optimization program which includes automated exposure control, adjustment of the mA and/or kV according to patient size and/or use of iterative reconstruction technique. COMPARISON:  None. FINDINGS: CT HEAD FINDINGS Brain: Cerebral volume is normal. There is no acute intracranial hemorrhage. No demarcated cortical infarct. No extra-axial fluid collection. No evidence of an intracranial mass. No midline shift. Vascular: No hyperdense vessel. Skull: No calvarial fracture. 2.8 cm ill-defined lucent region within the right parietal calvarium. Sinuses/Orbits: No orbital mass or acute orbital finding. Minimal mucosal thickening within the left frontal, bilateral  ethmoid, bilateral sphenoid and left maxillary sinuses at the imaged levels. CT CERVICAL SPINE FINDINGS Alignment: Dextrocurvature of the cervical spine. Nonspecific reversal expected cervical lordosis. No significant spondylolisthesis. Skull base and vertebrae: The basion-dental and atlanto-dental intervals are maintained.No evidence of acute fracture to the cervical spine. Soft tissues and spinal canal: No prevertebral fluid or swelling. No visible canal hematoma. Disc levels: Cervical spondylosis. Mild-to-moderate disc space narrowing at C5-C6. Multilevel disc bulges/central disc protrusions and uncovertebral hypertrophy. No appreciable high-grade spinal canal stenosis. Multilevel bony neural foraminal narrowing. Small C5-C6 ventral osteophyte. Upper chest: No consolidation within the imaged lung apices. No visible pneumothorax. IMPRESSION: CT head: 1.  No evidence of an acute intracranial abnormality. 2. 2.8 cm ill-defined lucent region within the right parietal calvarium. Although nonspecific, in the absence of any known malignancy this likely reflects a benign lesion (such  as a hemangioma). 3. Mild paranasal sinus mucosal thickening at the imaged levels. CT cervical spine: 1. No evidence of an acute cervical spine fracture. 2. Nonspecific reversal of the expected cervical lordosis. 3. Dextrocurvature of the cervical spine. 4. Cervical spondylosis as described. Electronically Signed   By: Bascom Lily D.O.   On: 05/24/2023 12:04   CT CERVICAL SPINE WO CONTRAST Result Date: 05/24/2023 CLINICAL DATA:  Provided history: Polytrauma, blunt. Head trauma, moderate/severe. EXAM: CT HEAD WITHOUT CONTRAST CT CERVICAL SPINE WITHOUT CONTRAST TECHNIQUE: Multidetector CT imaging of the head and cervical spine was performed following the standard protocol without intravenous contrast. Multiplanar CT image reconstructions of the cervical spine were also generated. RADIATION DOSE REDUCTION: This exam was performed according to the departmental dose-optimization program which includes automated exposure control, adjustment of the mA and/or kV according to patient size and/or use of iterative reconstruction technique. COMPARISON:  None. FINDINGS: CT HEAD FINDINGS Brain: Cerebral volume is normal. There is no acute intracranial hemorrhage. No demarcated cortical infarct. No extra-axial fluid collection. No evidence of an intracranial mass. No midline shift. Vascular: No hyperdense vessel. Skull: No calvarial fracture. 2.8 cm ill-defined lucent region within the right parietal calvarium. Sinuses/Orbits: No orbital mass or acute orbital finding. Minimal mucosal thickening within the left frontal, bilateral ethmoid, bilateral sphenoid and left maxillary sinuses at the imaged levels. CT CERVICAL SPINE FINDINGS Alignment: Dextrocurvature of the cervical spine. Nonspecific reversal expected cervical lordosis. No significant spondylolisthesis. Skull base and vertebrae: The basion-dental and atlanto-dental intervals are maintained.No evidence of acute fracture to the cervical spine. Soft tissues and spinal  canal: No prevertebral fluid or swelling. No visible canal hematoma. Disc levels: Cervical spondylosis. Mild-to-moderate disc space narrowing at C5-C6. Multilevel disc bulges/central disc protrusions and uncovertebral hypertrophy. No appreciable high-grade spinal canal stenosis. Multilevel bony neural foraminal narrowing. Small C5-C6 ventral osteophyte. Upper chest: No consolidation within the imaged lung apices. No visible pneumothorax. IMPRESSION: CT head: 1.  No evidence of an acute intracranial abnormality. 2. 2.8 cm ill-defined lucent region within the right parietal calvarium. Although nonspecific, in the absence of any known malignancy this likely reflects a benign lesion (such as a hemangioma). 3. Mild paranasal sinus mucosal thickening at the imaged levels. CT cervical spine: 1. No evidence of an acute cervical spine fracture. 2. Nonspecific reversal of the expected cervical lordosis. 3. Dextrocurvature of the cervical spine. 4. Cervical spondylosis as described. Electronically Signed   By: Bascom Lily D.O.   On: 05/24/2023 12:04   CT CHEST ABDOMEN PELVIS W CONTRAST Result Date: 05/24/2023 CLINICAL DATA:  Fall. EXAM: CT CHEST, ABDOMEN, AND PELVIS WITH CONTRAST TECHNIQUE: Multidetector CT imaging  of the chest, abdomen and pelvis was performed following the standard protocol during bolus administration of intravenous contrast. RADIATION DOSE REDUCTION: This exam was performed according to the departmental dose-optimization program which includes automated exposure control, adjustment of the mA and/or kV according to patient size and/or use of iterative reconstruction technique. CONTRAST:  75mL OMNIPAQUE IOHEXOL 350 MG/ML SOLN COMPARISON:  August 13, 2016. FINDINGS: CT CHEST FINDINGS Cardiovascular: No significant vascular findings. Normal heart size. No pericardial effusion. Mediastinum/Nodes: No enlarged mediastinal, hilar, or axillary lymph nodes. Thyroid gland, trachea, and esophagus demonstrate no  significant findings. Lungs/Pleura: Lungs are clear. No pleural effusion or pneumothorax. Musculoskeletal: No chest wall mass or suspicious bone lesions identified. CT ABDOMEN PELVIS FINDINGS Hepatobiliary: No focal liver abnormality is seen. No gallstones, gallbladder wall thickening, or biliary dilatation. Pancreas: Unremarkable. No pancreatic ductal dilatation or surrounding inflammatory changes. Spleen: Normal in size without focal abnormality. Adrenals/Urinary Tract: Adrenal glands are unremarkable. Kidneys are normal, without renal calculi, focal lesion, or hydronephrosis. Bladder is unremarkable. Stomach/Bowel: Stomach is within normal limits. Appendix appears normal. No evidence of bowel wall thickening, distention, or inflammatory changes. Vascular/Lymphatic: No significant vascular findings are present. No enlarged abdominal or pelvic lymph nodes. Reproductive: Uterus and bilateral adnexa are unremarkable. Other: No ascites or hernia is noted. Musculoskeletal: No acute or significant osseous findings. IMPRESSION: No definite abnormality seen in the chest, abdomen or pelvis. Electronically Signed   By: Rosalene Colon M.D.   On: 05/24/2023 12:03    Procedures Procedures    Medications Ordered in ED Medications  fentaNYL  (SUBLIMAZE ) injection 50 mcg (50 mcg Intravenous Given 05/24/23 0941)  sodium chloride  0.9 % bolus 1,000 mL (0 mLs Intravenous Stopped 05/24/23 1130)  iohexol (OMNIPAQUE) 350 MG/ML injection 75 mL (75 mLs Intravenous Contrast Given 05/24/23 1147)    ED Course/ Medical Decision Making/ A&P                                 Medical Decision Making Amount and/or Complexity of Data Reviewed Labs: ordered. Radiology: ordered.  Risk Prescription drug management.    Dominique Webb is a 38 y.o. female with no significant past medical history who presents for fall.  According to patient, she was celebrating a friend's engagement last night when she took several shots of  alcohol.  She reports that she then had a mechanical fall where she missed a step and tumbled down a flight of stairs down a floor.  She did not lose consciousness but is complaining of severe headache, neck pain, chest pain, back pain, and left side pain.  She reports pleuritic discomfort.  She denies shortness of breath but it does hurt to breathe.  She reports no pain in her arms or legs.  No vision changes, speech difficulties or nausea or vomiting.  She denies any preceding symptoms.  On exam, lungs clear.  Chest is quite tender.  Abdomen not tender but her left flank is.  Back is tender diffusely with some palpable muscle spasm.  Neck is tender.  Head did not have focal tenderness or lacerations but she has headache.  Pupils are symmetric and reactive with normal extraocular movements.  Patient had normal bowel sounds.  No murmur.  Patient otherwise well-appearing.  Due to the patient's tenderness in her neck, back, chest and side and her headache, we will get CT imaging to rule out traumatic injuries.  Will get CT head, neck and chest/ab/pelvis.  Will get screening labs.  She reports she is on her menstrual cycle but will get a pregnancy test as well.  Anticipate likely soft tissue musculoskeletal pains and injuries will make sure there is not something more serious.  Anticipate discharge if workup reassuring.  Workup returned overall reassuring.  CT imaging did not show any significant traumatic injuries and did show just a small possible hemangioma in the calvarium.  We discussed this finding and she can follow-up with the PCP for this.  Otherwise workup reassuring.  Labs reassuring.  Slight leukocytosis but no other critical findings.  Patient is feeling much better now.  Patient would like to go home.  Patient be given prescription for muscle relaxant lido patches and she will follow-up with a PCP.  She understood return precautions and follow-up instructions and was discharged in good  condition after passing a p.o. challenge.         Final Clinical Impression(s) / ED Diagnoses Final diagnoses:  Fall, initial encounter  Muscle spasm    Rx / DC Orders ED Discharge Orders          Ordered    lidocaine  (LIDODERM ) 5 %  Every 24 hours        05/24/23 1437    cyclobenzaprine  (FLEXERIL ) 10 MG tablet  2 times daily PRN        05/24/23 1437           Clinical Impression: 1. Fall, initial encounter   2. Muscle spasm     Disposition: Discharge  Condition: Good  I have discussed the results, Dx and Tx plan with the pt(& family if present). He/she/they expressed understanding and agree(s) with the plan. Discharge instructions discussed at great length. Strict return precautions discussed and pt &/or family have verbalized understanding of the instructions. No further questions at time of discharge.    Discharge Medication List as of 05/24/2023  2:53 PM     START taking these medications   Details  !! cyclobenzaprine  (FLEXERIL ) 10 MG tablet Take 1 tablet (10 mg total) by mouth 2 (two) times daily as needed for muscle spasms., Starting Sat 05/24/2023, Print    !! lidocaine  (LIDODERM ) 5 % Place 1 patch onto the skin daily. Remove & Discard patch within 12 hours or as directed by MD, Starting Sat 05/24/2023, Print     !! - Potential duplicate medications found. Please discuss with provider.      Follow Up: Adra Alanis, FNP [address] [place] [address] [phone]     [facility] Emergency Department at [facility] [address] [place]  [address] [phone]        Sahil Milner, Marine Sia, MD 05/24/23 1515

## 2023-05-26 ENCOUNTER — Telehealth: Payer: Self-pay

## 2023-05-26 NOTE — Telephone Encounter (Signed)
 See message below. Please advise.  Copied from CRM [id number]. Topic: General - Call Back - No Documentation >> May 26, 2023  5:01 PM Shereese L wrote: Reason for CRM: [employer] called to verify  if a Disability claim was faxed 05/15 cb# [phone]

## 2023-05-27 ENCOUNTER — Ambulatory Visit (INDEPENDENT_AMBULATORY_CARE_PROVIDER_SITE_OTHER): Admitting: Family

## 2023-05-27 VITALS — BP 116/57 | HR 58 | Ht 62.0 in | Wt 123.6 lb

## 2023-05-27 DIAGNOSIS — M542 Cervicalgia: Secondary | ICD-10-CM

## 2023-05-27 DIAGNOSIS — M545 Low back pain, unspecified: Secondary | ICD-10-CM | POA: Diagnosis not present

## 2023-05-27 DIAGNOSIS — W19XXXD Unspecified fall, subsequent encounter: Secondary | ICD-10-CM

## 2023-05-27 NOTE — Progress Notes (Signed)
 Dominique Webb is a 38 y.o. female with the following history as recorded in EpicCare:  Patient Active Problem List   Diagnosis Date Noted   HSIL (high grade squamous intraepithelial lesion) on Pap smear of cervix 11/25/2022   Nexplanon  in place 09/19/2016    Current Outpatient Medications  Medication Sig Dispense Refill   clotrimazole  (LOTRIMIN ) 1 % cream Apply 1 Application topically 2 (two) times daily. 30 g 0   cyclobenzaprine  (FLEXERIL ) 10 MG tablet Take 1 tablet (10 mg total) by mouth 2 (two) times daily as needed for muscle spasms. 20 tablet 0   cyclobenzaprine  (FLEXERIL ) 10 MG tablet Take 1 tablet (10 mg total) by mouth 2 (two) times daily as needed for muscle spasms. 20 tablet 0   Etonogestrel  (NEXPLANON  [place]) Inject into the skin. Placed 2022     lidocaine  (LIDODERM ) 5 % Place 1 patch onto the skin daily. Remove & Discard patch within 12 hours or as directed by MD 30 patch 0   lidocaine  (LIDODERM ) 5 % Place 1 patch onto the skin daily. Remove & Discard patch within 12 hours or as directed by MD 15 patch 0   Multiple Vitamins-Minerals (HAIR SKIN AND NAILS FORMULA PO) Take by mouth daily.     naproxen  (NAPROSYN ) 500 MG tablet Take 1 tablet (500 mg total) by mouth 2 (two) times daily. 30 tablet 0   No current facility-administered medications for this visit.    Allergies: Aspirin  Past Medical History:  Diagnosis Date   Medical history non-contributory     Past Surgical History:  Procedure Laterality Date   COLPOSCOPY W/ BIOPSY / CURETTAGE  01/14/2023   LEEP  02/19/2023   WISDOM TOOTH EXTRACTION      Family History  Problem Relation Age of Onset   Diabetes Mother    Hypertension Mother    Hyperlipidemia Mother    Stroke Father    Hypertension Father     Social History   Tobacco Use   Smoking status: Every Day    Current packs/day: 0.00    Average packs/day: 0.5 packs/day for 18.2 years (9.1 ttl pk-yrs)    Types: Cigarettes    Start date: 2004    Last attempt to  quit: 04/07/2020    Years since quitting: 3.1   Smokeless tobacco: Never   Tobacco comments:    pt smokes some days - quit with pregnancy  Substance Use Topics   Alcohol use: Yes    Comment: Social    Subjective:   Patient was seen at the ER on 05/19/23 with a muscle strain and was told by ER provider to remain out of work from 5/12-5/18 with plan to return on 5/19. Unfortunately, she slipped on flight of stairs ( 15 steps) on Saturday, May 17 and re-injured her back and neck; Currently taking Naproxen , Flexeril  and Lidoderm  patches;  Does need FMLA and short term disability paperwork completed; would like to remain out of work for the remainder of the week;   Objective:  Vitals:   05/27/23 1355  BP: (!) 116/57  Pulse: (!) 58  SpO2: 100%  Weight: 123 lb 9.6 oz (56.1 kg)  Height: 5\' 2"  (1.575 m)    General: Well developed, well nourished, in no acute distress  Skin : Warm and dry.  Head: Normocephalic and atraumatic  Eyes: Sclera and conjunctiva clear; pupils round and reactive to light; extraocular movements intact  Ears: External normal; canals clear; tympanic membranes normal  Oropharynx: Pink, supple. No suspicious lesions  Neck: Supple without thyromegaly, adenopathy  Lungs: Respirations unlabored; clear to auscultation bilaterally without wheeze, rales, rhonchi  CVS exam: normal rate and regular rhythm.  Musculoskeletal: No deformities; no active joint inflammation  Extremities: No edema, cyanosis, clubbing  Vessels: Symmetric bilaterally  Neurologic: Alert and oriented; speech intact; face symmetrical; moves all extremities well; CNII-XII intact without focal deficit   Assessment:  1. Neck pain   2. Acute low back pain without sciatica, unspecified back pain laterality   3. Fall, subsequent encounter     Plan:  Patient slowly improving but agree that due to nature of her job she should remain out of work for the remainder of the week; will complete paperwork to cover  patient from 5/12-5/23; she will continue Naproxen , Flexeril  and Lidoderm  as needed.   No follow-ups on file.  No orders of the defined types were placed in this encounter.   Requested Prescriptions    No prescriptions requested or ordered in this encounter

## 2023-05-27 NOTE — Telephone Encounter (Signed)
 From has been received by fax and also pt came in office to drop off a copy.

## 2023-05-28 ENCOUNTER — Telehealth: Payer: Self-pay | Admitting: Family

## 2023-05-28 NOTE — Telephone Encounter (Signed)
 Forms have been faxed back to company.

## 2023-05-28 NOTE — Telephone Encounter (Signed)
 Forms have been faxed to number provided.

## 2023-05-28 NOTE — Telephone Encounter (Signed)
 Copied from CRM [id number]. Topic: General - Other >> May 28, 2023  9:52 AM Martinique E wrote: Reason for CRM: Patient called in stating that her employer has not received a fax pertaining to the paperwork that she dropped off yesterday at her time of appointment. Patient stated the fax number is on the bottom of this paperwork for where it should be sent to.

## 2023-05-30 ENCOUNTER — Telehealth: Payer: Self-pay

## 2023-05-30 ENCOUNTER — Telehealth: Payer: Self-pay | Admitting: Family

## 2023-05-30 NOTE — Telephone Encounter (Signed)
 Called pt back and advised pt forms were faxed on 05/28/2023, also explained to pt that [employer] called earlier requesting further information, advised pt I re faxed the forms today with the updated information. Pt is aware and expressed understanding.

## 2023-05-30 NOTE — Telephone Encounter (Signed)
 Copied from CRM [id number]. Topic: General - Other >> May 30, 2023  1:42 PM Martinique E wrote: Reason for CRM: Devra Fontana from [employer] called in stating that the patient's disability paperwork is missing the primary diagnosis and ICD code for the disability claim. Callback number for Devra Fontana is [phone] Ext. [phone].

## 2023-05-30 NOTE — Telephone Encounter (Signed)
 Copied from CRM [id number]. Topic: General - Other >> May 30, 2023  3:06 PM Chuck Crater wrote: Reason for CRM: Patient was seen on Tuesday and pcp had to fill out paperwork. Patient stated that her job needs a return to work note and office notes that supports her time from being off. Please contact patient if needed.

## 2023-05-30 NOTE — Telephone Encounter (Signed)
 Called and left a VM for Devra Fontana, will re-send forms again updated with the diagnoses codes.

## 2023-06-03 ENCOUNTER — Ambulatory Visit: Payer: Self-pay

## 2023-06-03 ENCOUNTER — Encounter: Payer: Self-pay | Admitting: Family

## 2023-06-03 ENCOUNTER — Inpatient Hospital Stay: Admitting: Family

## 2023-06-03 ENCOUNTER — Ambulatory Visit (INDEPENDENT_AMBULATORY_CARE_PROVIDER_SITE_OTHER): Admitting: Family

## 2023-06-03 VITALS — BP 110/80 | HR 94 | Temp 98.4°F | Ht 62.0 in | Wt 125.0 lb

## 2023-06-03 DIAGNOSIS — M545 Low back pain, unspecified: Secondary | ICD-10-CM | POA: Diagnosis not present

## 2023-06-03 DIAGNOSIS — W19XXXD Unspecified fall, subsequent encounter: Secondary | ICD-10-CM | POA: Diagnosis not present

## 2023-06-03 DIAGNOSIS — M542 Cervicalgia: Secondary | ICD-10-CM

## 2023-06-03 NOTE — Telephone Encounter (Signed)
  Chief Complaint: neck/back pain follow up Symptoms: neck pain, lower back pain radiating to left buttocks, chest tightness when coughing or sneezing, generalized back and neck weakness Frequency: x 2 weeks Pertinent Negatives: Patient denies loss of bowel or bladder control, numbness Disposition: [] ED /[] Urgent Care (no appt availability in office) / [x] Appointment(In office/virtual)/ []  [facility]/ [] Home Care/ [] Refused Recommended Disposition /[] [facility]/ []  Follow-up with PCP Additional Notes: Patient states she is still having severe neck and back pain. She states she has been taking the naproxen , lidocaine  and flexeril . She states she feels like she needs something stronger. She was sent home from work today. Patient agreeable to acute visit with PCP this afternoon.  Copied from CRM [id number]. Topic: Clinical - Red Word Triage >> Jun 03, 2023 10:47 AM Chuck Crater wrote: Red Word that prompted transfer to Nurse Triage: Patient is still having extreme back pain from falling last week. Reason for Disposition  [1] SEVERE back pain (e.g., excruciating, unable to do any normal activities) AND [2] not improved 2 hours after pain medicine  Answer Assessment - Initial Assessment Questions 1. ONSET: "When did the pain begin?"      X 2 weeks.  2. LOCATION: "Where does it hurt?" (upper, mid or lower back)     Lower back.  3. SEVERITY: "How bad is the pain?"  (e.g., Scale 1-10; mild, moderate, or severe)   - MILD (1-3): Doesn't interfere with normal activities.    - MODERATE (4-7): Interferes with normal activities or awakens from sleep.    - SEVERE (8-10): Excruciating pain, unable to do any normal activities.      8.5/10, ran out of the medication the PCP prescribed.  4. PATTERN: "Is the pain constant?" (e.g., yes, no; constant, intermittent)      Yes, constant.  5. RADIATION: "Does the pain shoot into your legs or somewhere else?"     Left buttocks.  6. CAUSE:   "What do you think is causing the back pain?"      Fall.  7. BACK OVERUSE:  "Any recent lifting of heavy objects, strenuous work or exercise?"     No.  8. MEDICINES: "What have you taken so far for the pain?" (e.g., nothing, acetaminophen , NSAIDS)     Flexeril , lidocaine , naproxen .  9. NEUROLOGIC SYMPTOMS: "Do you have any weakness, numbness, or problems with bowel/bladder control?"     Generalized weakness on back and neck.  10. OTHER SYMPTOMS: "Do you have any other symptoms?" (e.g., fever, abdomen pain, burning with urination, blood in urine)       Neck pain, chest tightness when coughing or sneezing,  11. PREGNANCY: "Is there any chance you are pregnant?" "When was your last menstrual period?"       LMP: 05/18/23.  Protocols used: Back Pain-A-AH

## 2023-06-03 NOTE — Progress Notes (Signed)
 Dominique Webb is a 38 y.o. female with the following history as recorded in EpicCare:  Patient Active Problem List   Diagnosis Date Noted   HSIL (high grade squamous intraepithelial lesion) on Pap smear of cervix 11/25/2022   Nexplanon  in place 09/19/2016    Current Outpatient Medications  Medication Sig Dispense Refill   clotrimazole  (LOTRIMIN ) 1 % cream Apply 1 Application topically 2 (two) times daily. 30 g 0   cyclobenzaprine  (FLEXERIL ) 10 MG tablet Take 1 tablet (10 mg total) by mouth 2 (two) times daily as needed for muscle spasms. 20 tablet 0   cyclobenzaprine  (FLEXERIL ) 10 MG tablet Take 1 tablet (10 mg total) by mouth 2 (two) times daily as needed for muscle spasms. 20 tablet 0   Etonogestrel  (NEXPLANON  [place]) Inject into the skin. Placed 2022     lidocaine  (LIDODERM ) 5 % Place 1 patch onto the skin daily. Remove & Discard patch within 12 hours or as directed by MD 30 patch 0   lidocaine  (LIDODERM ) 5 % Place 1 patch onto the skin daily. Remove & Discard patch within 12 hours or as directed by MD 15 patch 0   Multiple Vitamins-Minerals (HAIR SKIN AND NAILS FORMULA PO) Take by mouth daily.     naproxen  (NAPROSYN ) 500 MG tablet Take 1 tablet (500 mg total) by mouth 2 (two) times daily. 30 tablet 0   No current facility-administered medications for this visit.    Allergies: Aspirin  Past Medical History:  Diagnosis Date   Medical history non-contributory     Past Surgical History:  Procedure Laterality Date   COLPOSCOPY W/ BIOPSY / CURETTAGE  01/14/2023   LEEP  02/19/2023   WISDOM TOOTH EXTRACTION      Family History  Problem Relation Age of Onset   Diabetes Mother    Hypertension Mother    Hyperlipidemia Mother    Stroke Father    Hypertension Father     Social History   Tobacco Use   Smoking status: Every Day    Current packs/day: 0.00    Average packs/day: 0.5 packs/day for 18.2 years (9.1 ttl pk-yrs)    Types: Cigarettes    Start date: 2004    Last attempt to  quit: 04/07/2020    Years since quitting: 3.1   Smokeless tobacco: Never   Tobacco comments:    pt smokes some days - quit with pregnancy  Substance Use Topics   Alcohol use: Yes    Comment: Social    Subjective:   Patient was seen last week secondary to a fall with back/ neck pain; had been evaluated at ER with negative head/ neck/ chest CT; was planning to return to work today but was sent home by her employer due to persisting symptoms and inability to do her job; no chest pain or shortness of breath but concerned about persisting back/ neck pain; patient has been taking Naproxen , Flexeril  and Lidocaine  patches;   Objective:  Vitals:   06/03/23 1316  BP: 110/80  Pulse: 94  Temp: 98.4 F (36.9 C)  TempSrc: Oral  SpO2: 99%  Weight: 125 lb (56.7 kg)  Height: 5\' 2"  (1.575 m)    General: Well developed, well nourished, in no acute distress  Skin : Warm and dry.  Head: Normocephalic and atraumatic  Lungs: Respirations unlabored;  Musculoskeletal: No deformities; no active joint inflammation  Extremities: No edema, cyanosis, clubbing  Vessels: Symmetric bilaterally  Neurologic: Alert and oriented; speech intact; face symmetrical; moves all extremities well; CNII-XII  intact without focal deficit   Assessment:  1. Neck pain   2. Acute low back pain without sciatica, unspecified back pain laterality   3. Fall, subsequent encounter     Plan:  Due to persisting symptoms, will get 2nd opinion with sports medicine; she will see this office tomorrow and can discuss long term treatment plan; will not change medication today since she is seeing sports medicine tomorrow.   No follow-ups on file.  No orders of the defined types were placed in this encounter.   Requested Prescriptions    No prescriptions requested or ordered in this encounter

## 2023-06-04 ENCOUNTER — Encounter: Payer: Self-pay | Admitting: Family Medicine

## 2023-06-04 ENCOUNTER — Telehealth: Payer: Self-pay

## 2023-06-04 ENCOUNTER — Ambulatory Visit (INDEPENDENT_AMBULATORY_CARE_PROVIDER_SITE_OTHER): Admitting: Family Medicine

## 2023-06-04 VITALS — BP 104/70 | HR 77 | Ht 62.0 in | Wt 126.0 lb

## 2023-06-04 DIAGNOSIS — M549 Dorsalgia, unspecified: Secondary | ICD-10-CM

## 2023-06-04 DIAGNOSIS — M542 Cervicalgia: Secondary | ICD-10-CM

## 2023-06-04 DIAGNOSIS — G9389 Other specified disorders of brain: Secondary | ICD-10-CM

## 2023-06-04 MED ORDER — CYCLOBENZAPRINE HCL 10 MG PO TABS: 10.0000 mg | ORAL_TABLET | Freq: Two times a day (BID) | ORAL | 1 refills | Status: DC | PRN

## 2023-06-04 MED ORDER — NAPROXEN 500 MG PO TABS: 500.0000 mg | ORAL_TABLET | Freq: Two times a day (BID) | ORAL | 1 refills | Status: DC | PRN

## 2023-06-04 NOTE — Telephone Encounter (Signed)
 Pt e-mailing FMLA forms.   Remain out of work x 1 month. Re-eval in 3 weeks.

## 2023-06-04 NOTE — Patient Instructions (Addendum)
 Thank you for coming in today.   A referral for physical therapy has been submitted. A representative from the physical therapy office will contact you to coordinate scheduling after confirming your benefits with your insurance provider. If you do not hear from the physical therapy office within the next 1-2 weeks, please let us  know.  Start prescribed medications as directed.    Remain out of work for the next month. You can e-mail FMLA and/or disability forms to [place] at [email or web address].  See you back in 3 weeks.

## 2023-06-04 NOTE — Progress Notes (Signed)
 I, Miquel Amen, CMA acting as a scribe for Garlan Juniper, MD.  Dominique Webb is a 38 y.o. female who presents to [facility] at [facility] today for neck and back pain ongoing since the 16th. Pt was drinking alcohol and fell down the stairs and woke up the next morning on the couch, unable to move. Brought to ED by EMS.  Pt locates pain to chest, upper back and neck. Fell down 15 stairs while at an engagement party. Denies LOC, photosensitivity, HA.  Radiating pain: no LE numbness/tingling: no LE weakness: no Aggravates: bending forward Treatments tried: lidocaine  patches, flexeril , naproxen   Pertinent review of systems: No fevers or chills  Relevant historical information: Otherwise healthy.   Exam:  BP 104/70   Pulse 77   Ht 5\' 2"  (1.575 m)   Wt 126 lb (57.2 kg)   SpO2 99%   BMI 23.05 kg/m  General: Well Developed, well nourished, and in no acute distress.   MSK: C-Spine: Normal appearing Nontender palpation spinal midline. Decreased cervical motion. T-spine nontender palpation midline.  Tender palpation paraspinal musculature. Normal motion. Upper extremity strength is intact.    Lab and Radiology Results CT HEAD WO CONTRAST Result Date: 05/24/2023 CLINICAL DATA:  Provided history: Polytrauma, blunt. Head trauma, moderate/severe. EXAM: CT HEAD WITHOUT CONTRAST CT CERVICAL SPINE WITHOUT CONTRAST TECHNIQUE: Multidetector CT imaging of the head and cervical spine was performed following the standard protocol without intravenous contrast. Multiplanar CT image reconstructions of the cervical spine were also generated. RADIATION DOSE REDUCTION: This exam was performed according to the departmental dose-optimization program which includes automated exposure control, adjustment of the mA and/or kV according to patient size and/or use of iterative reconstruction technique. COMPARISON:  None. FINDINGS: CT HEAD FINDINGS Brain: Cerebral volume is normal. There is  no acute intracranial hemorrhage. No demarcated cortical infarct. No extra-axial fluid collection. No evidence of an intracranial mass. No midline shift. Vascular: No hyperdense vessel. Skull: No calvarial fracture. 2.8 cm ill-defined lucent region within the right parietal calvarium. Sinuses/Orbits: No orbital mass or acute orbital finding. Minimal mucosal thickening within the left frontal, bilateral ethmoid, bilateral sphenoid and left maxillary sinuses at the imaged levels. CT CERVICAL SPINE FINDINGS Alignment: Dextrocurvature of the cervical spine. Nonspecific reversal expected cervical lordosis. No significant spondylolisthesis. Skull base and vertebrae: The basion-dental and atlanto-dental intervals are maintained.No evidence of acute fracture to the cervical spine. Soft tissues and spinal canal: No prevertebral fluid or swelling. No visible canal hematoma. Disc levels: Cervical spondylosis. Mild-to-moderate disc space narrowing at C5-C6. Multilevel disc bulges/central disc protrusions and uncovertebral hypertrophy. No appreciable high-grade spinal canal stenosis. Multilevel bony neural foraminal narrowing. Small C5-C6 ventral osteophyte. Upper chest: No consolidation within the imaged lung apices. No visible pneumothorax. IMPRESSION: CT head: 1.  No evidence of an acute intracranial abnormality. 2. 2.8 cm ill-defined lucent region within the right parietal calvarium. Although nonspecific, in the absence of any known malignancy this likely reflects a benign lesion (such as a hemangioma). 3. Mild paranasal sinus mucosal thickening at the imaged levels. CT cervical spine: 1. No evidence of an acute cervical spine fracture. 2. Nonspecific reversal of the expected cervical lordosis. 3. Dextrocurvature of the cervical spine. 4. Cervical spondylosis as described. Electronically Signed   By: Bascom Lily D.O.   On: 05/24/2023 12:04   CT CERVICAL SPINE WO CONTRAST Result Date: 05/24/2023 CLINICAL DATA:  Provided  history: Polytrauma, blunt. Head trauma, moderate/severe. EXAM: CT HEAD WITHOUT CONTRAST CT CERVICAL SPINE WITHOUT CONTRAST  TECHNIQUE: Multidetector CT imaging of the head and cervical spine was performed following the standard protocol without intravenous contrast. Multiplanar CT image reconstructions of the cervical spine were also generated. RADIATION DOSE REDUCTION: This exam was performed according to the departmental dose-optimization program which includes automated exposure control, adjustment of the mA and/or kV according to patient size and/or use of iterative reconstruction technique. COMPARISON:  None. FINDINGS: CT HEAD FINDINGS Brain: Cerebral volume is normal. There is no acute intracranial hemorrhage. No demarcated cortical infarct. No extra-axial fluid collection. No evidence of an intracranial mass. No midline shift. Vascular: No hyperdense vessel. Skull: No calvarial fracture. 2.8 cm ill-defined lucent region within the right parietal calvarium. Sinuses/Orbits: No orbital mass or acute orbital finding. Minimal mucosal thickening within the left frontal, bilateral ethmoid, bilateral sphenoid and left maxillary sinuses at the imaged levels. CT CERVICAL SPINE FINDINGS Alignment: Dextrocurvature of the cervical spine. Nonspecific reversal expected cervical lordosis. No significant spondylolisthesis. Skull base and vertebrae: The basion-dental and atlanto-dental intervals are maintained.No evidence of acute fracture to the cervical spine. Soft tissues and spinal canal: No prevertebral fluid or swelling. No visible canal hematoma. Disc levels: Cervical spondylosis. Mild-to-moderate disc space narrowing at C5-C6. Multilevel disc bulges/central disc protrusions and uncovertebral hypertrophy. No appreciable high-grade spinal canal stenosis. Multilevel bony neural foraminal narrowing. Small C5-C6 ventral osteophyte. Upper chest: No consolidation within the imaged lung apices. No visible pneumothorax.  IMPRESSION: CT head: 1.  No evidence of an acute intracranial abnormality. 2. 2.8 cm ill-defined lucent region within the right parietal calvarium. Although nonspecific, in the absence of any known malignancy this likely reflects a benign lesion (such as a hemangioma). 3. Mild paranasal sinus mucosal thickening at the imaged levels. CT cervical spine: 1. No evidence of an acute cervical spine fracture. 2. Nonspecific reversal of the expected cervical lordosis. 3. Dextrocurvature of the cervical spine. 4. Cervical spondylosis as described. Electronically Signed   By: Bascom Lily D.O.   On: 05/24/2023 12:04   CT CHEST ABDOMEN PELVIS W CONTRAST Result Date: 05/24/2023 CLINICAL DATA:  Fall. EXAM: CT CHEST, ABDOMEN, AND PELVIS WITH CONTRAST TECHNIQUE: Multidetector CT imaging of the chest, abdomen and pelvis was performed following the standard protocol during bolus administration of intravenous contrast. RADIATION DOSE REDUCTION: This exam was performed according to the departmental dose-optimization program which includes automated exposure control, adjustment of the mA and/or kV according to patient size and/or use of iterative reconstruction technique. CONTRAST:  75mL OMNIPAQUE  IOHEXOL  350 MG/ML SOLN COMPARISON:  August 13, 2016. FINDINGS: CT CHEST FINDINGS Cardiovascular: No significant vascular findings. Normal heart size. No pericardial effusion. Mediastinum/Nodes: No enlarged mediastinal, hilar, or axillary lymph nodes. Thyroid gland, trachea, and esophagus demonstrate no significant findings. Lungs/Pleura: Lungs are clear. No pleural effusion or pneumothorax. Musculoskeletal: No chest wall mass or suspicious bone lesions identified. CT ABDOMEN PELVIS FINDINGS Hepatobiliary: No focal liver abnormality is seen. No gallstones, gallbladder wall thickening, or biliary dilatation. Pancreas: Unremarkable. No pancreatic ductal dilatation or surrounding inflammatory changes. Spleen: Normal in size without focal  abnormality. Adrenals/Urinary Tract: Adrenal glands are unremarkable. Kidneys are normal, without renal calculi, focal lesion, or hydronephrosis. Bladder is unremarkable. Stomach/Bowel: Stomach is within normal limits. Appendix appears normal. No evidence of bowel wall thickening, distention, or inflammatory changes. Vascular/Lymphatic: No significant vascular findings are present. No enlarged abdominal or pelvic lymph nodes. Reproductive: Uterus and bilateral adnexa are unremarkable. Other: No ascites or hernia is noted. Musculoskeletal: No acute or significant osseous findings. IMPRESSION: No definite abnormality seen in the chest,  abdomen or pelvis. Electronically Signed   By: Rosalene Colon M.D.   On: 05/24/2023 12:03   MM 3D DIAGNOSTIC MAMMOGRAM UNILATERAL LEFT BREAST Result Date: 05/07/2023 CLINICAL DATA:  Recall from baseline screening to evaluate a possible left breast asymmetry. EXAM: DIGITAL DIAGNOSTIC UNILATERAL LEFT MAMMOGRAM WITH TOMOSYNTHESIS AND CAD TECHNIQUE: Left digital diagnostic mammography and breast tomosynthesis was performed. The images were evaluated with computer-aided detection. COMPARISON:  Screening mammogram 04/28/2023 ACR Breast Density Category c: The breasts are heterogeneously dense, which may obscure small masses. FINDINGS: Additional spot compression tomographic images over the left breast demonstrate no focal abnormality over the outer midportion of the left breast. The questionable screening asymmetry is due to overlapping dense fibroglandular tissue. IMPRESSION: No concerning abnormality over the outer midportion of the left breast. RECOMMENDATION: Recommend continued annual bilateral screening mammographic follow-up. I have discussed the findings and recommendations with the patient. If applicable, a reminder letter will be sent to the patient regarding the next appointment. BI-RADS CATEGORY  1: Negative. Electronically Signed   By: Roda Cirri M.D.   On: 05/07/2023  11:11   MM 3D SCREENING MAMMOGRAM BILATERAL BREAST Result Date: 05/01/2023 CLINICAL DATA:  Screening. EXAM: DIGITAL SCREENING BILATERAL MAMMOGRAM WITH TOMOSYNTHESIS AND CAD TECHNIQUE: Bilateral screening digital craniocaudal and mediolateral oblique mammograms were obtained. Bilateral screening digital breast tomosynthesis was performed. The images were evaluated with computer-aided detection. COMPARISON:  None available. ACR Breast Density Category c: The breasts are heterogeneously dense, which may obscure small masses. FINDINGS: In the left breast, a possible mass warrants further evaluation. In the right breast, no findings suspicious for malignancy. IMPRESSION: Further evaluation is suggested for a possible mass in the left breast. RECOMMENDATION: Diagnostic mammogram and possibly ultrasound of the left breast. (Code:[id number]) The patient will be contacted regarding the findings, and additional imaging will be scheduled. BI-RADS CATEGORY  0: Incomplete: Need additional imaging evaluation. Electronically Signed   By: Alger Infield M.D.   On: 05/01/2023 12:26   I, Garlan Juniper, personally (independently) visualized and performed the interpretation of the CT scan images attached in this note.     Assessment and Plan: 38 y.o. female with neck and back pain after falling down stairs.  Fortunately CT scan head neck chest abdomen and pelvis did not show any acute findings.  Dominique Webb has no fractures.  Main source of pain is muscle spasm and dysfunction.  Plan for physical therapy referral.  Refilled naproxen  and cyclobenzaprine .  CT scan head does show an abnormality that is thought to be chronic or hemangioma.  This is not an emergency but should be evaluated with an MRI in my opinion.  Plan for MRI brain.  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  Out of work note written.  Will complete FMLA and short-term disability paperwork.  Recheck in 3 weeks.  Patient  may have had a concussion originally but Dominique Webb is getting to be here is essentially symptom-free at this point.  Watchful waiting continue further evaluation if needed for this.  PDMP not reviewed this encounter. Orders Placed This Encounter  Procedures   MR BRAIN WO CONTRAST    Standing Status:   Future    Expiration Date:   06/03/2024    What is the patient's sedation requirement?:   No Sedation    Does the patient have a pacemaker or implanted devices?:   No    Preferred imaging location?:   [facility] (table limit-550lbs)  Ambulatory referral to Physical Therapy    Referral Priority:   Routine    Referral Type:   Physical Medicine    Referral Reason:   Specialty Services Required    Requested Specialty:   Physical Therapy    Number of Visits Requested:   1   Meds ordered this encounter  Medications   cyclobenzaprine  (FLEXERIL ) 10 MG tablet    Sig: Take 1 tablet (10 mg total) by mouth 2 (two) times daily as needed for muscle spasms.    Dispense:  60 tablet    Refill:  1   naproxen  (NAPROSYN ) 500 MG tablet    Sig: Take 1 tablet (500 mg total) by mouth 3 times/day as needed-between meals & bedtime.    Dispense:  60 tablet    Refill:  1     Discussed warning signs or symptoms. Please see discharge instructions. Patient expresses understanding.   The above documentation has been reviewed and is accurate and complete Garlan Juniper, M.D.

## 2023-06-10 NOTE — Telephone Encounter (Signed)
Form completed and placed on Dr. Corey's desk to review and sign.  

## 2023-06-10 NOTE — Telephone Encounter (Signed)
 Form reviewed and signed by Dr. Alease Hunter, placed at the front desk for faxing scanning. Needs to sign ROI form.   Called pt, left VM to call the office.

## 2023-06-11 NOTE — Telephone Encounter (Signed)
 Signed ROI received from pt. Forms faxed successfully to [phone] and sent to scan.

## 2023-06-17 NOTE — Therapy (Signed)
 OUTPATIENT PHYSICAL THERAPY EVALUATION   Patient Name: Dominique Webb MRN: [id number] DOB:June 28, 1985, 38 y.o., female Today's Date: 06/18/2023   END OF SESSION:  PT End of Session - 06/18/23 1607     Visit Number 1    Number of Visits 9    Date for PT Re-Evaluation 08/13/23    Authorization Type U[employer]    PT Start Time 1345    PT Stop Time 1430    PT Time Calculation (min) 45 min    Activity Tolerance Patient tolerated treatment well    Behavior During Therapy [facility] for tasks assessed/performed             Past Medical History:  Diagnosis Date   Medical history non-contributory    Past Surgical History:  Procedure Laterality Date   COLPOSCOPY W/ BIOPSY / CURETTAGE  01/14/2023   LEEP  02/19/2023   WISDOM TOOTH EXTRACTION     Patient Active Problem List   Diagnosis Date Noted   HSIL (high grade squamous intraepithelial lesion) on Pap smear of cervix 11/25/2022   Nexplanon  in place 09/19/2016    PCP: Adra Alanis, FNP  REFERRING PROVIDER: Syliva Even, MD  REFERRING DIAG: Cervicalgia; Mid back pain  THERAPY DIAG:  Cervicalgia  Pain in thoracic spine  Abnormal posture  Rationale for Evaluation and Treatment: Rehabilitation  ONSET DATE: 05/19/2023   SUBJECTIVE:           SUBJECTIVE STATEMENT: Patient reports pain in her chest, upper back, and neck. This has been going on since May 12th where she went to the hospital and she was given medication, and then she had a fall a few days later down the stairs and that made the pain a lot worse. It feels like her chest is tightening up whenever she coughs or sneezes, then when she bends her neck forward it feels like something is rushing to front of her head. She states she has not been able to sleeping in her bed since thfall.  PERTINENT HISTORY:  See PMH above  PAIN:  Are you having pain? Yes:  NPRS scale: 5/10 currently  Pain location: Chest, neck, upper back Pain description: Tight Aggravating  factors: Turning neck, bending neck forward Relieving factors: Heat, lidocaine  patches, medication  PRECAUTIONS: None  RED FLAGS: None    WEIGHT BEARING RESTRICTIONS: No  FALLS:  Has patient fallen in last 6 months? Yes. Number of falls 1  PLOF: Independent  PATIENT GOALS: Pain relief so she can return to work   OBJECTIVE:  Note: Objective measures were completed at Evaluation unless otherwise noted. PATIENT SURVEYS:  NDI 23/50 (46% disability)  COGNITION: Overall cognitive status: Within functional limits for tasks assessed  SENSATION: WFL  POSTURE:  Rounded and elevated shoulders, straightening of cervical lordosis  PALPATION: Tender to palpation bilateral upper trap and levator scap, cervical paraspinals, lower periscapular musculature   Cervical CPA hypomobile  CERVICAL ROM:   Active ROM A/PROM (deg) eval  Flexion 40  Extension 20  Right lateral flexion 30  Left lateral flexion 40  Right rotation 50  Left rotation 40   (Blank rows = not tested)  UPPER EXTREMITY ROM: Patient demonstrates limitations in shoulder elevation and reach behind back due to pulling of periscapular musculature  UPPER EXTREMITY MMT:  MMT Right eval Left eval  Shoulder flexion    Shoulder extension    Shoulder abduction    Shoulder adduction    Shoulder extension    Shoulder internal rotation  Shoulder external rotation    Middle trapezius    Lower trapezius    Elbow flexion    Elbow extension    Wrist flexion    Wrist extension    Wrist ulnar deviation    Wrist radial deviation    Wrist pronation    Wrist supination    Grip strength     (Blank rows = not tested)  CERVICAL SPECIAL TESTS:  Not assessed  FUNCTIONAL TESTS:  DNF endurance: < 3 seconds due to pain   TREATMENT  [facility] Adult PT Treatment:                                                DATE: 06/18/2023 STM bilateral upper trap and cervical paraspinals Prone cervical CPA mobilization Seated upper  trap and levator scap stretch 2 x 15 sec each Seated shoulder blade squeezes 3 x 5 sec Standing wall slide 3 x 5 sec  PATIENT EDUCATION:  Education details: Exam findings, POC, HEP Person educated: Patient Education method: Programmer, multimedia, Demonstration, Tactile cues, Verbal cues, and Handouts Education comprehension: verbalized understanding, returned demonstration, verbal cues required, tactile cues required, and needs further education  HOME EXERCISE PROGRAM: Access Code: [email or web address]    ASSESSMENT: CLINICAL IMPRESSION: Patient is a 38 y.o. female who was seen today for physical therapy evaluation and treatment for chronic neck, chest, and mid-upper back pain. Her symptoms seem to be primarily musculoskeletal without any radicular presentation this visit. She demonstrates significant limitations in her cervical mobility and active motion, restricted shoulder motion that seems more related to periscapular tightness, and postural deviations. She did report improvement in symptoms following manual therapy this visit.   OBJECTIVE IMPAIRMENTS: decreased activity tolerance, decreased ROM, decreased strength, postural dysfunction, and pain.   ACTIVITY LIMITATIONS: carrying, lifting, bending, sleeping, bathing, reach over head, and hygiene/grooming  PARTICIPATION LIMITATIONS: meal prep, cleaning, driving, shopping, community activity, and occupation  PERSONAL FACTORS: Fitness, Past/current experiences, and Time since onset of injury/illness/exacerbation are also affecting patient's functional outcome.   REHAB POTENTIAL: Good  CLINICAL DECISION MAKING: Stable/uncomplicated  EVALUATION COMPLEXITY: Low   GOALS: Goals reviewed with patient? Yes  SHORT TERM GOALS: Target date: 07/16/2023  Patient will be I with initial HEP in order to progress with therapy. Baseline: HEP provided at eval Goal status: INITIAL  2.  Patient will report neck, chest, and back pain </= 3/10 in order to reduce  functional limitations Baseline: 5/10 Goal status: INITIAL  3.  Patient will demonstrate shoulder AROM grossly WFL in order to improve self care and grooming tasks Baseline: patient exhibits limitations with shoulder AROM Goal status: INITIAL  LONG TERM GOALS: Target date: 08/13/2023  Patient will be I with final HEP to maintain progress from PT. Baseline: HEP provided at eval Goal status: INITIAL  2.  Patient will report NDI </= 5/50 (10% disability) in order to indicate an improvement in their functional status Baseline: 23/50 (46% disability) Goal status: INITIAL  3.  Patient will demonstrate cervical rotation >/= 70 deg bilaterally to improve driving Baseline: see limitations above Goal status: INITIAL  4.  Patient will report pain </= 1/10 in order to reduce functional limitations and allow return to work without limitations Baseline: 5/10 Goal status: INITIAL   PLAN: PT FREQUENCY: 1x/week  PT DURATION: 8 weeks  PLANNED INTERVENTIONS: 97164- PT Re-evaluation, 97750- Physical Performance Testing, 97110-Therapeutic exercises,  09811- Therapeutic activity, [id number]- Neuromuscular re-education, [phone]- Self Care, 29562- Manual therapy, G0283- Electrical stimulation (unattended), [phone] (1-2 muscles), 20561 (3+ muscles)- Dry Needling, Patient/Family education, Taping, Joint mobilization, Joint manipulation, Spinal manipulation, Spinal mobilization, Cryotherapy, and Moist heat  PLAN FOR NEXT SESSION: Review HEP and progress PRN, manual/mobs/TPDN for cervical region, continue with neck and shoulder stretching, thoracic mobility, DNF endurance and postural exercises   Leah Primus, PT, DPT, LAT, ATC 06/18/23  4:50 PM Phone: [phone] Fax: [phone]

## 2023-06-18 ENCOUNTER — Ambulatory Visit (INDEPENDENT_AMBULATORY_CARE_PROVIDER_SITE_OTHER): Admitting: Physical Therapy

## 2023-06-18 ENCOUNTER — Other Ambulatory Visit: Payer: Self-pay

## 2023-06-18 ENCOUNTER — Encounter: Payer: Self-pay | Admitting: Physical Therapy

## 2023-06-18 DIAGNOSIS — M546 Pain in thoracic spine: Secondary | ICD-10-CM | POA: Diagnosis not present

## 2023-06-18 DIAGNOSIS — M542 Cervicalgia: Secondary | ICD-10-CM | POA: Diagnosis not present

## 2023-06-18 DIAGNOSIS — R293 Abnormal posture: Secondary | ICD-10-CM | POA: Diagnosis not present

## 2023-06-18 NOTE — Patient Instructions (Signed)
 Access Code: [email or web address] URL: [email or web address] Date: 06/18/2023 Prepared by: Leah Primus  Exercises - Seated Cervical Sidebending Stretch  - 2-3 x daily - 2 reps - 15 seconds hold - Seated Levator Scapulae Stretch  - 2-3 x daily - 2 reps - 15 seconds hold - Seated Scapular Retraction  - 2-3 x daily - 3 reps - 5 seconds hold - Standing shoulder flexion wall slides  - 2-3 x daily - 3 reps - 5 seconds hold

## 2023-06-25 ENCOUNTER — Ambulatory Visit (INDEPENDENT_AMBULATORY_CARE_PROVIDER_SITE_OTHER): Admitting: Family Medicine

## 2023-06-25 ENCOUNTER — Other Ambulatory Visit: Payer: Self-pay

## 2023-06-25 ENCOUNTER — Encounter: Payer: Self-pay | Admitting: Family Medicine

## 2023-06-25 ENCOUNTER — Encounter: Payer: Self-pay | Admitting: Physical Therapy

## 2023-06-25 ENCOUNTER — Ambulatory Visit (INDEPENDENT_AMBULATORY_CARE_PROVIDER_SITE_OTHER): Admitting: Physical Therapy

## 2023-06-25 VITALS — BP 110/62 | HR 55 | Ht 62.0 in | Wt 125.0 lb

## 2023-06-25 DIAGNOSIS — R293 Abnormal posture: Secondary | ICD-10-CM | POA: Diagnosis not present

## 2023-06-25 DIAGNOSIS — M542 Cervicalgia: Secondary | ICD-10-CM

## 2023-06-25 DIAGNOSIS — M546 Pain in thoracic spine: Secondary | ICD-10-CM | POA: Diagnosis not present

## 2023-06-25 NOTE — Therapy (Signed)
 OUTPATIENT PHYSICAL THERAPY TREATMENT   Patient Name: Dominique Webb MRN: [id number] DOB:December 22, 1985, 38 y.o., female Today's Date: 06/25/2023   END OF SESSION:  PT End of Session - 06/25/23 1457     Visit Number 2    Number of Visits 9    Date for PT Re-Evaluation 08/13/23    Authorization Type UHC    PT Start Time 1510    PT Stop Time 1550    PT Time Calculation (min) 40 min    Activity Tolerance Patient tolerated treatment well    Behavior During Therapy [facility] for tasks assessed/performed           Past Medical History:  Diagnosis Date   Medical history non-contributory    Past Surgical History:  Procedure Laterality Date   COLPOSCOPY W/ BIOPSY / CURETTAGE  01/14/2023   LEEP  02/19/2023   WISDOM TOOTH EXTRACTION     Patient Active Problem List   Diagnosis Date Noted   HSIL (high grade squamous intraepithelial lesion) on Pap smear of cervix 11/25/2022   Nexplanon  in place 09/19/2016    PCP: Adra Alanis, FNP  REFERRING PROVIDER: Syliva Even, MD  REFERRING DIAG: Cervicalgia; Mid back pain  THERAPY DIAG:  Cervicalgia  Pain in thoracic spine  Abnormal posture  Rationale for Evaluation and Treatment: Rehabilitation  ONSET DATE: 05/19/2023   SUBJECTIVE:           SUBJECTIVE STATEMENT: Patient reports she is feeling a little better than what she was. She is reporting a little neck pain, most of the pain is related to the shoulders.   Eval: Patient reports pain in her chest, upper back, and neck. This has been going on since May 12th where she went to the hospital and she was given medication, and then she had a fall a few days later down the stairs and that made the pain a lot worse. It feels like her chest is tightening up whenever she coughs or sneezes, then when she bends her neck forward it feels like something is rushing to front of her head. She states she has not been able to sleeping in her bed since thfall.  PERTINENT HISTORY:  See PMH  above  PAIN:  Are you having pain? Yes:  NPRS scale: 5/10 currently  Pain location: Chest, neck, upper back Pain description: Tight Aggravating factors: Turning neck, bending neck forward Relieving factors: Heat, lidocaine  patches, medication  PRECAUTIONS: None  PATIENT GOALS: Pain relief so she can return to work   OBJECTIVE:  Note: Objective measures were completed at Evaluation unless otherwise noted. PATIENT SURVEYS:  NDI 23/50 (46% disability)  POSTURE:  Rounded and elevated shoulders, straightening of cervical lordosis  PALPATION: Tender to palpation bilateral upper trap and levator scap, cervical paraspinals, lower periscapular musculature   Cervical CPA hypomobile  CERVICAL ROM:   Active ROM A/PROM (deg) eval   06/25/2023  Flexion 40   Extension 20 50  Right lateral flexion 30   Left lateral flexion 40   Right rotation 50 70  Left rotation 40 55   (Blank rows = not tested)  UPPER EXTREMITY ROM: Patient demonstrates limitations in shoulder elevation and reach behind back due to pulling of periscapular musculature  UPPER EXTREMITY MMT:  MMT Right eval Left eval  Shoulder flexion    Shoulder extension    Shoulder abduction    Shoulder adduction    Shoulder extension    Shoulder internal rotation    Shoulder external rotation  Middle trapezius    Lower trapezius    Elbow flexion    Elbow extension    Wrist flexion    Wrist extension    Wrist ulnar deviation    Wrist radial deviation    Wrist pronation    Wrist supination    Grip strength     (Blank rows = not tested)  CERVICAL SPECIAL TESTS:  Not assessed  FUNCTIONAL TESTS:  DNF endurance: < 3 seconds due to pain   TREATMENT  [facility] Adult PT Treatment:                                                DATE: 06/25/2023 STM bilateral upper trap and cervical paraspinals UBE L1 x 4 min (fwd/bwd) to improve endurance and workload capacity Seated upper trap and levator scap stretch x 15 sec  each Sidelying thoracic rotation 5 x 5 sec each Step back stretch for shoulder and thoracic mobility 5 x 5 sec Row with green 2 x 10 Cervical extension SNAG x 10  PATIENT EDUCATION:  Education details: HEP update Person educated: Patient Education method: Explanation, Demonstration, Tactile cues, Verbal cues, and Handouts Education comprehension: verbalized understanding, returned demonstration, verbal cues required, tactile cues required, and needs further education  HOME EXERCISE PROGRAM: Access Code: [email or web address]    ASSESSMENT: CLINICAL IMPRESSION: Patient tolerated therapy well with no adverse effects. Therapy focused on reducing muscular tension about the cervical and shoulder region and improving her mobility to good tolerance. She does exhibit an improvement in her cervical motion this visit. Incorporated some thoracic mobility exercises and she did report improvement in shoulder tension following therapy. Updated HEP to progress her mobility and postural strengthening. Patient would benefit from continued skilled PT to progress mobility and strength in order to reduce pain and maximize functional ability.   Eval: Patient is a 38 y.o. female who was seen today for physical therapy evaluation and treatment for chronic neck, chest, and mid-upper back pain. Her symptoms seem to be primarily musculoskeletal without any radicular presentation this visit. She demonstrates significant limitations in her cervical mobility and active motion, restricted shoulder motion that seems more related to periscapular tightness, and postural deviations. She did report improvement in symptoms following manual therapy this visit.   OBJECTIVE IMPAIRMENTS: decreased activity tolerance, decreased ROM, decreased strength, postural dysfunction, and pain.   ACTIVITY LIMITATIONS: carrying, lifting, bending, sleeping, bathing, reach over head, and hygiene/grooming  PARTICIPATION LIMITATIONS: meal prep, cleaning,  driving, shopping, community activity, and occupation  PERSONAL FACTORS: Fitness, Past/current experiences, and Time since onset of injury/illness/exacerbation are also affecting patient's functional outcome.    GOALS: Goals reviewed with patient? Yes  SHORT TERM GOALS: Target date: 07/16/2023  Patient will be I with initial HEP in order to progress with therapy. Baseline: HEP provided at eval Goal status: INITIAL  2.  Patient will report neck, chest, and back pain </= 3/10 in order to reduce functional limitations Baseline: 5/10 Goal status: INITIAL  3.  Patient will demonstrate shoulder AROM grossly WFL in order to improve self care and grooming tasks Baseline: patient exhibits limitations with shoulder AROM Goal status: INITIAL  LONG TERM GOALS: Target date: 08/13/2023  Patient will be I with final HEP to maintain progress from PT. Baseline: HEP provided at eval Goal status: INITIAL  2.  Patient will report NDI </= 5/50 (10% disability) in  order to indicate an improvement in their functional status Baseline: 23/50 (46% disability) Goal status: INITIAL  3.  Patient will demonstrate cervical rotation >/= 70 deg bilaterally to improve driving Baseline: see limitations above Goal status: INITIAL  4.  Patient will report pain </= 1/10 in order to reduce functional limitations and allow return to work without limitations Baseline: 5/10 Goal status: INITIAL   PLAN: PT FREQUENCY: 1x/week  PT DURATION: 8 weeks  PLANNED INTERVENTIONS: 97164- PT Re-evaluation, 97750- Physical Performance Testing, 97110-Therapeutic exercises, 97530- Therapeutic activity, 97112- Neuromuscular re-education, 97535- Self Care, 16109- Manual therapy, G0283- Electrical stimulation (unattended), 20560 (1-2 muscles), 20561 (3+ muscles)- Dry Needling, Patient/Family education, Taping, Joint mobilization, Joint manipulation, Spinal manipulation, Spinal mobilization, Cryotherapy, and Moist heat  PLAN FOR NEXT  SESSION: Review HEP and progress PRN, manual/mobs/TPDN for cervical region, continue with neck and shoulder stretching, thoracic mobility, DNF endurance and postural exercises   Leah Primus, PT, DPT, LAT, ATC 06/25/23  3:56 PM Phone: [phone] Fax: [phone]

## 2023-06-25 NOTE — Progress Notes (Signed)
   I, Dominique Webb, CMA acting as a scribe for Dominique Juniper, MD.  Dominique Webb is a 38 y.o. female who presents to [facility] today for f/u neck and back pain. Pt was last seen by Dr. Alease Hunter on 06/04/23 and her naproxen  and cyclobenzaprine  were refilled. Brain MRI ordered to evaluate abnormality. Pt was also referred to PT, completing 1 visit.  She is scheduled for her brain MRI tomorrow.  Today, pt reports continued neck and back pain. Locates pain primarily to right upper back and neck. Shopping pain when raising the arm overhead. Denies n/t but endorses weakness. Denies new injury. Continues to take Naproxen  and cyclobenzaprine  prn. Going for PT once weekly.   Pertinent review of systems: No fevers or chills  Relevant historical information: Otherwise healthy   Exam:  BP 110/62   Pulse (!) 55   Ht 5' 2 (1.575 m)   Wt 125 lb (56.7 kg)   SpO2 100%   BMI 22.86 kg/m  General: Well Developed, well nourished, and in no acute distress.   MSK: C-spine normal appearing.  Normal motion intact strength.       Assessment and Plan: 38 y.o. female with neck and arm pain after falling down the stairs.  She had a first visit with physical therapy last week and has 1 scheduled for later today.  She is getting a little bit better with time home exercise program and physical therapy.  She has physical therapy scheduled until July 22.  She is scheduled to return to work on June 29th.  She will let me know how she feels closer to that return to work date.  If not able to return to work can modify letter and forms. Patient did have an abnormality on her CT scan of her head which we will follow-up with a brain MRI which is scheduled for tomorrow.   PDMP not reviewed this encounter. No orders of the defined types were placed in this encounter.  No orders of the defined types were placed in this encounter.    Discussed warning signs or symptoms. Please see  discharge instructions. Patient expresses understanding.   The above documentation has been reviewed and is accurate and complete Dominique Webb, M.D.

## 2023-06-25 NOTE — Patient Instructions (Signed)
 Access Code: [email or web address] URL: [email or web address] Date: 06/25/2023 Prepared by: Leah Primus  Exercises - Sidelying Thoracic Lumbar Rotation  - 2 x daily - 2 sets - 5 reps - 5 seconds hold - Seated Cervical Sidebending Stretch  - 2 x daily - 2 reps - 15 seconds hold - Seated Levator Scapulae Stretch  - 2 x daily - 2 reps - 15 seconds hold - Standing shoulder flexion wall slides  - 2 x daily - 3 reps - 5 seconds hold - Step Back Shoulder Stretch with Chair  - 2 x daily - 2 sets - 5 reps - 5 seconds hold - Standing Row with Anchored Resistance  - 1 x daily - 2 sets - 10 reps

## 2023-06-25 NOTE — Patient Instructions (Signed)
 Thank you for coming in today.   Continue physical therapy.  Let us  know if you're not ready to return to work by 07/05/23.   Check back as needed.

## 2023-06-26 ENCOUNTER — Ambulatory Visit
Admission: RE | Admit: 2023-06-26 | Discharge: 2023-06-26 | Disposition: A | Discharge status: Home / Self Care | Source: Ambulatory Visit | Attending: Family Medicine | Admitting: Family Medicine

## 2023-06-26 DIAGNOSIS — G9389 Other specified disorders of brain: Secondary | ICD-10-CM

## 2023-07-03 ENCOUNTER — Other Ambulatory Visit: Payer: Self-pay

## 2023-07-03 ENCOUNTER — Ambulatory Visit: Payer: Self-pay | Admitting: Family Medicine

## 2023-07-03 ENCOUNTER — Encounter: Payer: Self-pay | Admitting: Physical Therapy

## 2023-07-03 ENCOUNTER — Ambulatory Visit (INDEPENDENT_AMBULATORY_CARE_PROVIDER_SITE_OTHER): Admitting: Physical Therapy

## 2023-07-03 DIAGNOSIS — M542 Cervicalgia: Secondary | ICD-10-CM | POA: Diagnosis not present

## 2023-07-03 DIAGNOSIS — M546 Pain in thoracic spine: Secondary | ICD-10-CM

## 2023-07-03 DIAGNOSIS — R293 Abnormal posture: Secondary | ICD-10-CM

## 2023-07-03 NOTE — Progress Notes (Signed)
 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  No further evaluation of this is needed.

## 2023-07-03 NOTE — Therapy (Signed)
 OUTPATIENT PHYSICAL THERAPY TREATMENT   Patient Name: Dominique Webb MRN: [id number] DOB:01-09-1985, 38 y.o., female Today's Date: 07/03/2023   END OF SESSION:  PT End of Session - 07/03/23 0936     Visit Number 3    Number of Visits 9    Date for PT Re-Evaluation 08/13/23    Authorization Type [employer]    PT Start Time [phone]    PT Stop Time 1015    PT Time Calculation (min) 44 min    Activity Tolerance Patient tolerated treatment well    Behavior During Therapy [facility] for tasks assessed/performed            Past Medical History:  Diagnosis Date   Medical history non-contributory    Past Surgical History:  Procedure Laterality Date   COLPOSCOPY W/ BIOPSY / CURETTAGE  01/14/2023   LEEP  02/19/2023   WISDOM TOOTH EXTRACTION     Patient Active Problem List   Diagnosis Date Noted   HSIL (high grade squamous intraepithelial lesion) on Pap smear of cervix 11/25/2022   Nexplanon  in place 09/19/2016    PCP: Jason Leita Repine, FNP  REFERRING PROVIDER: Joane Artist RAMAN, MD  REFERRING DIAG: Cervicalgia; Mid back pain  THERAPY DIAG:  Cervicalgia  Pain in thoracic spine  Abnormal posture  Rationale for Evaluation and Treatment: Rehabilitation  ONSET DATE: 05/19/2023   SUBJECTIVE:           SUBJECTIVE STATEMENT: Patient reports the past couple of days she has been having more pain on the left side. States that she thinks it is from trying to sleep in her bed.  Eval: Patient reports pain in her chest, upper back, and neck. This has been going on since May 12th where she went to the hospital and she was given medication, and then she had a fall a few days later down the stairs and that made the pain a lot worse. It feels like her chest is tightening up whenever she coughs or sneezes, then when she bends her neck forward it feels like something is rushing to front of her head. She states she has not been able to sleeping in her bed since thfall.  PERTINENT HISTORY:  See PMH  above  PAIN:  Are you having pain? Yes:  NPRS scale: 4/10 currently  Pain location: Chest, neck, upper back Pain description: Tight, sore Aggravating factors: Turning neck, bending neck forward Relieving factors: Heat, lidocaine  patches, medication  PRECAUTIONS: None  PATIENT GOALS: Pain relief so she can return to work   OBJECTIVE:  Note: Objective measures were completed at Evaluation unless otherwise noted. PATIENT SURVEYS:  NDI 23/50 (46% disability)  POSTURE:  Rounded and elevated shoulders, straightening of cervical lordosis  PALPATION: Tender to palpation bilateral upper trap and levator scap, cervical paraspinals, lower periscapular musculature   Cervical CPA hypomobile  CERVICAL ROM:   Active ROM A/PROM (deg) eval   06/25/2023   07/03/2023  Flexion 40    Extension 20 50   Right lateral flexion 30    Left lateral flexion 40    Right rotation 50 70   Left rotation 40 55 62   (Blank rows = not tested)  UPPER EXTREMITY ROM: Patient demonstrates limitations in shoulder elevation and reach behind back due to pulling of periscapular musculature  UPPER EXTREMITY MMT:  MMT Right eval Left eval  Shoulder flexion    Shoulder extension    Shoulder abduction    Shoulder adduction    Shoulder extension  Shoulder internal rotation    Shoulder external rotation    Middle trapezius    Lower trapezius    Elbow flexion    Elbow extension    Wrist flexion    Wrist extension    Wrist ulnar deviation    Wrist radial deviation    Wrist pronation    Wrist supination    Grip strength     (Blank rows = not tested)  CERVICAL SPECIAL TESTS:  Not assessed  FUNCTIONAL TESTS:  DNF endurance: < 3 seconds due to pain   TREATMENT  [facility] Adult PT Treatment:                                                DATE: 07/03/2023 UBE L1 x 4 min (fwd/bwd) to improve endurance and workload capacity Supine suboccipital release with gentle manual traction STM bilateral upper  trap and cervical paraspinals Prone cervical PA grave IV mobs Prone thoracic PA grade V thrust manipulation Seated cervical extension and rotation SNAG using towel 10 x 3 sec each Sidelying thoracic rotation 5 x 5 sec each Seated cervical retraction with passive finger assist on chin 2 x 10 x 5 sec  PATIENT EDUCATION:  Education details: HEP update Person educated: Patient Education method: Explanation, Demonstration, Tactile cues, Verbal cues, and Handouts Education comprehension: verbalized understanding, returned demonstration, verbal cues required, tactile cues required, and needs further education  HOME EXERCISE PROGRAM: Access Code: [email or web address]    ASSESSMENT: CLINICAL IMPRESSION: Patient tolerated therapy well with no adverse effects. Therapy focused on improving her spinal mobility and progressing postural control to reduce muscle tension and pain. Updated HEP to progress her mobility and postural strengthening. Incorporated more manual therapy and self spinal mobilizations with good tolerance. She did exhibit improved left cervical rotation this visit but does not more tightness on the left side. Updated her HEP to include self cervical SNAGs. Patient would benefit from continued skilled PT to progress mobility and strength in order to reduce pain and maximize functional ability.   Eval: Patient is a 38 y.o. female who was seen today for physical therapy evaluation and treatment for chronic neck, chest, and mid-upper back pain. Her symptoms seem to be primarily musculoskeletal without any radicular presentation this visit. She demonstrates significant limitations in her cervical mobility and active motion, restricted shoulder motion that seems more related to periscapular tightness, and postural deviations. She did report improvement in symptoms following manual therapy this visit.  OBJECTIVE IMPAIRMENTS: decreased activity tolerance, decreased ROM, decreased strength, postural  dysfunction, and pain.   ACTIVITY LIMITATIONS: carrying, lifting, bending, sleeping, bathing, reach over head, and hygiene/grooming  PARTICIPATION LIMITATIONS: meal prep, cleaning, driving, shopping, community activity, and occupation  PERSONAL FACTORS: Fitness, Past/current experiences, and Time since onset of injury/illness/exacerbation are also affecting patient's functional outcome.    GOALS: Goals reviewed with patient? Yes  SHORT TERM GOALS: Target date: 07/16/2023  Patient will be I with initial HEP in order to progress with therapy. Baseline: HEP provided at eval Goal status: INITIAL  2.  Patient will report neck, chest, and back pain </= 3/10 in order to reduce functional limitations Baseline: 5/10 Goal status: INITIAL  3.  Patient will demonstrate shoulder AROM grossly WFL in order to improve self care and grooming tasks Baseline: patient exhibits limitations with shoulder AROM Goal status: INITIAL  LONG TERM GOALS: Target date:  08/13/2023  Patient will be I with final HEP to maintain progress from PT. Baseline: HEP provided at eval Goal status: INITIAL  2.  Patient will report NDI </= 5/50 (10% disability) in order to indicate an improvement in their functional status Baseline: 23/50 (46% disability) Goal status: INITIAL  3.  Patient will demonstrate cervical rotation >/= 70 deg bilaterally to improve driving Baseline: see limitations above Goal status: INITIAL  4.  Patient will report pain </= 1/10 in order to reduce functional limitations and allow return to work without limitations Baseline: 5/10 Goal status: INITIAL   PLAN: PT FREQUENCY: 1x/week  PT DURATION: 8 weeks  PLANNED INTERVENTIONS: 97164- PT Re-evaluation, 97750- Physical Performance Testing, 97110-Therapeutic exercises, 97530- Therapeutic activity, 97112- Neuromuscular re-education, 97535- Self Care, 02859- Manual therapy, G0283- Electrical stimulation (unattended), 20560 (1-2 muscles), 20561 (3+  muscles)- Dry Needling, Patient/Family education, Taping, Joint mobilization, Joint manipulation, Spinal manipulation, Spinal mobilization, Cryotherapy, and Moist heat  PLAN FOR NEXT SESSION: Review HEP and progress PRN, manual/mobs/TPDN for cervical region, continue with neck and shoulder stretching, thoracic mobility, DNF endurance and postural exercises   Elaine Daring, PT, DPT, LAT, ATC 07/03/23  10:15 AM Phone: [phone] Fax: [phone]

## 2023-07-03 NOTE — Patient Instructions (Signed)
 Access Code: [email or web address] URL: [email or web address] Date: 07/03/2023 Prepared by: Elaine Daring  Exercises - Sidelying Thoracic Lumbar Rotation  - 2 x daily - 2 sets - 5 reps - 5 seconds hold - Seated Cervical Sidebending Stretch  - 2 x daily - 2 reps - 15 seconds hold - Seated Levator Scapulae Stretch  - 2 x daily - 2 reps - 15 seconds hold - Cervical Extension AROM with Strap  - 2 x daily - 10 reps - 3 seconds hold - Seated Assisted Cervical Rotation with Towel  - 2 x daily - 10 reps - 3 seconds hold - Standing shoulder flexion wall slides  - 2 x daily - 3 reps - 5 seconds hold - Step Back Shoulder Stretch with Chair  - 2 x daily - 2 sets - 5 reps - 5 seconds hold - Standing Row with Anchored Resistance  - 1 x daily - 2 sets - 10 reps

## 2023-07-04 ENCOUNTER — Encounter: Payer: Self-pay | Admitting: Family

## 2023-07-07 ENCOUNTER — Telehealth: Payer: Self-pay

## 2023-07-07 NOTE — Telephone Encounter (Signed)
 FMLA  Leave ID [id number]  Leave date: 05/19/23-07/29/23

## 2023-07-07 NOTE — Telephone Encounter (Signed)
 FMLA and Short-term Disability forms completed, placed at the front desk for faxing/scanning.

## 2023-07-08 ENCOUNTER — Ambulatory Visit (INDEPENDENT_AMBULATORY_CARE_PROVIDER_SITE_OTHER): Admitting: Physical Therapy

## 2023-07-08 ENCOUNTER — Other Ambulatory Visit: Payer: Self-pay

## 2023-07-08 ENCOUNTER — Encounter: Payer: Self-pay | Admitting: Physical Therapy

## 2023-07-08 DIAGNOSIS — M542 Cervicalgia: Secondary | ICD-10-CM | POA: Diagnosis not present

## 2023-07-08 DIAGNOSIS — M546 Pain in thoracic spine: Secondary | ICD-10-CM

## 2023-07-08 DIAGNOSIS — R293 Abnormal posture: Secondary | ICD-10-CM

## 2023-07-08 NOTE — Therapy (Signed)
 OUTPATIENT PHYSICAL THERAPY TREATMENT   Patient Name: Dominique Webb MRN: [id number] DOB:Aug 04, 1985, 38 y.o., female Today's Date: 07/08/2023   END OF SESSION:  PT End of Session - 07/08/23 1026     Visit Number 4    Number of Visits 9    Date for PT Re-Evaluation 08/13/23    Authorization Type [employer]    PT Start Time 1020    PT Stop Time 1100    PT Time Calculation (min) 40 min    Activity Tolerance Patient tolerated treatment well    Behavior During Therapy [facility] for tasks assessed/performed             Past Medical History:  Diagnosis Date   Medical history non-contributory    Past Surgical History:  Procedure Laterality Date   COLPOSCOPY W/ BIOPSY / CURETTAGE  01/14/2023   LEEP  02/19/2023   WISDOM TOOTH EXTRACTION     Patient Active Problem List   Diagnosis Date Noted   HSIL (high grade squamous intraepithelial lesion) on Pap smear of cervix 11/25/2022   Nexplanon  in place 09/19/2016    PCP: Jason Leita Repine, FNP  REFERRING PROVIDER: Joane Artist RAMAN, MD  REFERRING DIAG: Cervicalgia; Mid back pain  THERAPY DIAG:  Cervicalgia  Pain in thoracic spine  Abnormal posture  Rationale for Evaluation and Treatment: Rehabilitation  ONSET DATE: 05/19/2023   SUBJECTIVE:           SUBJECTIVE STATEMENT: Patient reports she is doing alright, neck is feeling better. She was having some tightness in her chest earlier but was also doing a lot of lifting this morning. She states she can finally sleep back in her bed again.   Eval: Patient reports pain in her chest, upper back, and neck. This has been going on since May 12th where she went to the hospital and she was given medication, and then she had a fall a few days later down the stairs and that made the pain a lot worse. It feels like her chest is tightening up whenever she coughs or sneezes, then when she bends her neck forward it feels like something is rushing to front of her head. She states she has not been able  to sleeping in her bed since thfall.  PERTINENT HISTORY:  See PMH above  PAIN:  Are you having pain? Yes:  NPRS scale: 3/10 currently  Pain location: Chest, neck, upper back Pain description: Tight, sore Aggravating factors: Turning neck, bending neck forward Relieving factors: Heat, lidocaine  patches, medication  PRECAUTIONS: None  PATIENT GOALS: Pain relief so she can return to work   OBJECTIVE:  Note: Objective measures were completed at Evaluation unless otherwise noted. PATIENT SURVEYS:  NDI 23/50 (46% disability)  POSTURE:  Rounded and elevated shoulders, straightening of cervical lordosis  PALPATION: Tender to palpation bilateral upper trap and levator scap, cervical paraspinals, lower periscapular musculature   Cervical CPA hypomobile  CERVICAL ROM:   Active ROM A/PROM (deg) eval   06/25/2023   07/03/2023   07/08/2023  Flexion 40     Extension 20 50    Right lateral flexion 30     Left lateral flexion 40     Right rotation 50 70  75  Left rotation 40 55 62 70   (Blank rows = not tested)  UPPER EXTREMITY ROM: Patient demonstrates limitations in shoulder elevation and reach behind back due to pulling of periscapular musculature  UPPER EXTREMITY MMT:  MMT Right eval Left eval  Shoulder flexion  Shoulder extension    Shoulder abduction    Shoulder adduction    Shoulder extension    Shoulder internal rotation    Shoulder external rotation    Middle trapezius    Lower trapezius    Elbow flexion    Elbow extension    Wrist flexion    Wrist extension    Wrist ulnar deviation    Wrist radial deviation    Wrist pronation    Wrist supination    Grip strength     (Blank rows = not tested)  CERVICAL SPECIAL TESTS:  Not assessed  FUNCTIONAL TESTS:  DNF endurance: < 3 seconds due to pain   TREATMENT  [facility] Adult PT Treatment:                                                DATE: 07/08/2023 UBE L1 x 4 min (fwd/bwd) to improve endurance and  workload capacity Prone cervical PA grade IV mobs Prone thoracic PA grade V thrust manipulation Supine suboccipital release with gentle manual traction Sidelying thoracic rotation 10 x 5 sec each Cat cow x 10 Seated cervical extension and rotation SNAG using towel 10 x 3 sec each Seated cervical retraction with passive finger assist on chin 2 x 10 x 5 sec  PATIENT EDUCATION:  Education details: HEP Person educated: Patient Education method: Programmer, multimedia, Facilities manager, Actor cues, Verbal cues Education comprehension: verbalized understanding, returned demonstration, verbal cues required, tactile cues required, and needs further education  HOME EXERCISE PROGRAM: Access Code: [email or web address]    ASSESSMENT: CLINICAL IMPRESSION: Patient tolerated therapy well with no adverse effects. Therapy focused on improving her spinal mobility and progressing postural control to reduce muscle tension and pain. Continued with cervical mobs and thoracic manipulation with cavitation. She does exhibit improvement in her cervical rotation with less tightness reported this visit. No changes made to her HEP this visit. Patient would benefit from continued skilled PT to progress mobility and strength in order to reduce pain and maximize functional ability.   Eval: Patient is a 38 y.o. female who was seen today for physical therapy evaluation and treatment for chronic neck, chest, and mid-upper back pain. Her symptoms seem to be primarily musculoskeletal without any radicular presentation this visit. She demonstrates significant limitations in her cervical mobility and active motion, restricted shoulder motion that seems more related to periscapular tightness, and postural deviations. She did report improvement in symptoms following manual therapy this visit.  OBJECTIVE IMPAIRMENTS: decreased activity tolerance, decreased ROM, decreased strength, postural dysfunction, and pain.   ACTIVITY LIMITATIONS: carrying,  lifting, bending, sleeping, bathing, reach over head, and hygiene/grooming  PARTICIPATION LIMITATIONS: meal prep, cleaning, driving, shopping, community activity, and occupation  PERSONAL FACTORS: Fitness, Past/current experiences, and Time since onset of injury/illness/exacerbation are also affecting patient's functional outcome.    GOALS: Goals reviewed with patient? Yes  SHORT TERM GOALS: Target date: 07/16/2023  Patient will be I with initial HEP in order to progress with therapy. Baseline: HEP provided at eval Goal status: INITIAL  2.  Patient will report neck, chest, and back pain </= 3/10 in order to reduce functional limitations Baseline: 5/10 Goal status: INITIAL  3.  Patient will demonstrate shoulder AROM grossly WFL in order to improve self care and grooming tasks Baseline: patient exhibits limitations with shoulder AROM Goal status: INITIAL  LONG TERM GOALS: Target date: 08/13/2023  Patient will be I with final HEP to maintain progress from PT. Baseline: HEP provided at eval Goal status: INITIAL  2.  Patient will report NDI </= 5/50 (10% disability) in order to indicate an improvement in their functional status Baseline: 23/50 (46% disability) Goal status: INITIAL  3.  Patient will demonstrate cervical rotation >/= 70 deg bilaterally to improve driving Baseline: see limitations above 07/08/2023: see above Goal status: MET  4.  Patient will report pain </= 1/10 in order to reduce functional limitations and allow return to work without limitations Baseline: 5/10 Goal status: INITIAL   PLAN: PT FREQUENCY: 1x/week  PT DURATION: 8 weeks  PLANNED INTERVENTIONS: 97164- PT Re-evaluation, 97750- Physical Performance Testing, 97110-Therapeutic exercises, 97530- Therapeutic activity, 97112- Neuromuscular re-education, 97535- Self Care, 02859- Manual therapy, G0283- Electrical stimulation (unattended), 20560 (1-2 muscles), 20561 (3+ muscles)- Dry Needling, Patient/Family  education, Taping, Joint mobilization, Joint manipulation, Spinal manipulation, Spinal mobilization, Cryotherapy, and Moist heat  PLAN FOR NEXT SESSION: Review HEP and progress PRN, manual/mobs/TPDN for cervical region, continue with neck and shoulder stretching, thoracic mobility, DNF endurance and postural exercises   Elaine Daring, PT, DPT, LAT, ATC 07/08/23  11:12 AM Phone: [phone] Fax: [phone]

## 2023-07-15 ENCOUNTER — Encounter: Payer: Self-pay | Admitting: Physical Therapy

## 2023-07-15 ENCOUNTER — Other Ambulatory Visit: Payer: Self-pay

## 2023-07-15 ENCOUNTER — Ambulatory Visit (INDEPENDENT_AMBULATORY_CARE_PROVIDER_SITE_OTHER): Admitting: Physical Therapy

## 2023-07-15 DIAGNOSIS — M546 Pain in thoracic spine: Secondary | ICD-10-CM | POA: Diagnosis not present

## 2023-07-15 DIAGNOSIS — M542 Cervicalgia: Secondary | ICD-10-CM

## 2023-07-15 DIAGNOSIS — R293 Abnormal posture: Secondary | ICD-10-CM | POA: Diagnosis not present

## 2023-07-15 NOTE — Patient Instructions (Signed)
 Access Code: [email or web address] URL: [email or web address] Date: 07/15/2023 Prepared by: Elaine Daring  Exercises - Sidelying Thoracic Lumbar Rotation  - 2 x daily - 2 sets - 5 reps - 5 seconds hold - Seated Cervical Sidebending Stretch  - 2 x daily - 2 reps - 15 seconds hold - Seated Levator Scapulae Stretch  - 2 x daily - 2 reps - 15 seconds hold - Cervical Extension AROM with Strap  - 2 x daily - 10 reps - 3 seconds hold - Seated Assisted Cervical Rotation with Towel  - 2 x daily - 10 reps - 3 seconds hold - Standing shoulder flexion wall slides  - 2 x daily - 3 reps - 5 seconds hold - Step Back Shoulder Stretch with Chair  - 2 x daily - 2 sets - 5 reps - 5 seconds hold - Standing Row with Anchored Resistance  - 1 x daily - 2 sets - 10 reps - Doorway Rhomboid Stretch  - 1 x daily - 3 reps - 20 seconds hold

## 2023-07-15 NOTE — Therapy (Signed)
 OUTPATIENT PHYSICAL THERAPY TREATMENT   Patient Name: Dominique Webb MRN: [id number] DOB:1985-04-05, 38 y.o., female Today's Date: 07/15/2023   END OF SESSION:  PT End of Session - 07/15/23 1026     Visit Number 5    Number of Visits 9    Date for PT Re-Evaluation 08/13/23    Authorization Type [employer]    PT Start Time 1026    PT Stop Time 1107    PT Time Calculation (min) 41 min    Activity Tolerance Patient tolerated treatment well    Behavior During Therapy [facility] for tasks assessed/performed              Past Medical History:  Diagnosis Date   Medical history non-contributory    Past Surgical History:  Procedure Laterality Date   COLPOSCOPY W/ BIOPSY / CURETTAGE  01/14/2023   LEEP  02/19/2023   WISDOM TOOTH EXTRACTION     Patient Active Problem List   Diagnosis Date Noted   HSIL (high grade squamous intraepithelial lesion) on Pap smear of cervix 11/25/2022   Nexplanon  in place 09/19/2016    PCP: Jason Leita Repine, FNP  REFERRING PROVIDER: Joane Artist RAMAN, MD  REFERRING DIAG: Cervicalgia; Mid back pain  THERAPY DIAG:  Cervicalgia  Pain in thoracic spine  Abnormal posture  Rationale for Evaluation and Treatment: Rehabilitation  ONSET DATE: 05/19/2023   SUBJECTIVE:           SUBJECTIVE STATEMENT: Patient reports she is doing alright, having a little bit of pain on the left side of the neck today. Kind of feels like a crick in her neck.   Eval: Patient reports pain in her chest, upper back, and neck. This has been going on since May 12th where she went to the hospital and she was given medication, and then she had a fall a few days later down the stairs and that made the pain a lot worse. It feels like her chest is tightening up whenever she coughs or sneezes, then when she bends her neck forward it feels like something is rushing to front of her head. She states she has not been able to sleeping in her bed since thfall.  PERTINENT HISTORY:  See PMH  above  PAIN:  Are you having pain? Yes:  NPRS scale: 3/10 currently  Pain location: Neck Pain description: Tight, sore Aggravating factors: Turning neck, bending neck forward Relieving factors: Heat, lidocaine  patches, medication  PRECAUTIONS: None  PATIENT GOALS: Pain relief so she can return to work   OBJECTIVE:  Note: Objective measures were completed at Evaluation unless otherwise noted. PATIENT SURVEYS:  NDI 23/50 (46% disability)  POSTURE:  Rounded and elevated shoulders, straightening of cervical lordosis  PALPATION: Tender to palpation bilateral upper trap and levator scap, cervical paraspinals, lower periscapular musculature   Cervical CPA hypomobile  CERVICAL ROM:   Active ROM A/PROM (deg) eval   06/25/2023   07/03/2023   07/08/2023  Flexion 40     Extension 20 50    Right lateral flexion 30     Left lateral flexion 40     Right rotation 50 70  75  Left rotation 40 55 62 70   (Blank rows = not tested)  UPPER EXTREMITY ROM: Patient demonstrates limitations in shoulder elevation and reach behind back due to pulling of periscapular musculature  07/15/2023: shoulder AROM grossly WFL  UPPER EXTREMITY MMT:  MMT Right eval Left eval  Shoulder flexion    Shoulder extension  Shoulder abduction    Shoulder adduction    Shoulder extension    Shoulder internal rotation    Shoulder external rotation    Middle trapezius    Lower trapezius    Elbow flexion    Elbow extension    Wrist flexion    Wrist extension    Wrist ulnar deviation    Wrist radial deviation    Wrist pronation    Wrist supination    Grip strength     (Blank rows = not tested)  CERVICAL SPECIAL TESTS:  Not assessed  FUNCTIONAL TESTS:  DNF endurance: < 3 seconds due to pain  07/15/2023: 12 seconds   TREATMENT  [facility] Adult PT Treatment:                                                DATE: 07/15/2023 UBE L3 x 4 min (fwd/bwd) to improve endurance and workload capacity Prone  cervical PA grade IV mobs Prone thoracic PA grade V thrust manipulation Supine suboccipital release with gentle manual traction Supine cervical side glide grade IV mobs Supine cervical cervical flexion rotational mobs Seated upper trap stretch 3 x 10 sec each Sidelying thoracic rotation 10 x 5 sec each Thoracic extension over FR with hands behind head 5 x 5 sec Step back stretch at counter 5 x 10 sec Standing rhomboid/posterior cuff stretch at doorway 3 x 15 sec each Seated cervical retraction with passive finger assist on chin 2 x 10 x 5 sec Row with blue 3 x 10  PATIENT EDUCATION:  Education details: HEP update Person educated: Patient Education method: Programmer, multimedia, Demonstration, Actor cues, Verbal cues, Handout Education comprehension: verbalized understanding, returned demonstration, verbal cues required, tactile cues required, and needs further education  HOME EXERCISE PROGRAM: Access Code: [email or web address]    ASSESSMENT: CLINICAL IMPRESSION: Patient tolerated therapy well with no adverse effects. She does exhibit improvement in her shoulder motion this visit but does report pulling in the posterior cuff and rhomboid region with cross body reach. She does continue to exhibit limitations in her DNF endurance and postural control. Therapy focused on improving spinal mobility/tightness and progressing postural control with good tolerance. Updated her HEP to incorporate shoulder stretching for home. Patient would benefit from continued skilled PT to progress mobility and strength in order to reduce pain and maximize functional ability.   Eval: Patient is a 38 y.o. female who was seen today for physical therapy evaluation and treatment for chronic neck, chest, and mid-upper back pain. Her symptoms seem to be primarily musculoskeletal without any radicular presentation this visit. She demonstrates significant limitations in her cervical mobility and active motion, restricted shoulder motion  that seems more related to periscapular tightness, and postural deviations. She did report improvement in symptoms following manual therapy this visit.  OBJECTIVE IMPAIRMENTS: decreased activity tolerance, decreased ROM, decreased strength, postural dysfunction, and pain.   ACTIVITY LIMITATIONS: carrying, lifting, bending, sleeping, bathing, reach over head, and hygiene/grooming  PARTICIPATION LIMITATIONS: meal prep, cleaning, driving, shopping, community activity, and occupation  PERSONAL FACTORS: Fitness, Past/current experiences, and Time since onset of injury/illness/exacerbation are also affecting patient's functional outcome.    GOALS: Goals reviewed with patient? Yes  SHORT TERM GOALS: Target date: 07/16/2023  Patient will be I with initial HEP in order to progress with therapy. Baseline: HEP provided at eval 07/15/2023: independent with initial HEP Goal status:  MET  2.  Patient will report neck, chest, and back pain </= 3/10 in order to reduce functional limitations Baseline: 5/10 07/15/2023: 3/10 Goal status: MET  3.  Patient will demonstrate shoulder AROM grossly WFL in order to improve self care and grooming tasks Baseline: patient exhibits limitations with shoulder AROM 07/15/2023: shoulder AROM grossly WFL Goal status: MET  LONG TERM GOALS: Target date: 08/13/2023  Patient will be I with final HEP to maintain progress from PT. Baseline: HEP provided at eval Goal status: INITIAL  2.  Patient will report NDI </= 5/50 (10% disability) in order to indicate an improvement in their functional status Baseline: 23/50 (46% disability) Goal status: INITIAL  3.  Patient will demonstrate cervical rotation >/= 70 deg bilaterally to improve driving Baseline: see limitations above 07/08/2023: see above Goal status: MET  4.  Patient will report pain </= 1/10 in order to reduce functional limitations and allow return to work without limitations Baseline: 5/10 Goal status:  INITIAL   PLAN: PT FREQUENCY: 1x/week  PT DURATION: 8 weeks  PLANNED INTERVENTIONS: 97164- PT Re-evaluation, 97750- Physical Performance Testing, 97110-Therapeutic exercises, 97530- Therapeutic activity, 97112- Neuromuscular re-education, 97535- Self Care, 02859- Manual therapy, G0283- Electrical stimulation (unattended), 20560 (1-2 muscles), 20561 (3+ muscles)- Dry Needling, Patient/Family education, Taping, Joint mobilization, Joint manipulation, Spinal manipulation, Spinal mobilization, Cryotherapy, and Moist heat  PLAN FOR NEXT SESSION: Review HEP and progress PRN, manual/mobs/TPDN for cervical region, continue with neck and shoulder stretching, thoracic mobility, DNF endurance and postural exercises   Elaine Daring, PT, DPT, LAT, ATC 07/15/23  11:18 AM Phone: [phone] Fax: [phone]

## 2023-07-22 ENCOUNTER — Encounter: Payer: Self-pay | Admitting: Physical Therapy

## 2023-07-22 ENCOUNTER — Ambulatory Visit (INDEPENDENT_AMBULATORY_CARE_PROVIDER_SITE_OTHER): Admitting: Physical Therapy

## 2023-07-22 ENCOUNTER — Other Ambulatory Visit: Payer: Self-pay

## 2023-07-22 DIAGNOSIS — M546 Pain in thoracic spine: Secondary | ICD-10-CM | POA: Diagnosis not present

## 2023-07-22 DIAGNOSIS — M542 Cervicalgia: Secondary | ICD-10-CM

## 2023-07-22 DIAGNOSIS — R293 Abnormal posture: Secondary | ICD-10-CM

## 2023-07-22 NOTE — Therapy (Signed)
 OUTPATIENT PHYSICAL THERAPY TREATMENT   Patient Name: Dominique Webb MRN: [id number] DOB:Feb 20, 1985, 38 y.o., female Today's Date: 07/22/2023   END OF SESSION:  PT End of Session - 07/22/23 1039     Visit Number 6    Number of Visits 9    Date for PT Re-Evaluation 08/13/23    Authorization Type [employer]    PT Start Time 1026    PT Stop Time 1104    PT Time Calculation (min) 38 min    Activity Tolerance Patient tolerated treatment well    Behavior During Therapy [facility] for tasks assessed/performed              Past Medical History:  Diagnosis Date   Medical history non-contributory    Past Surgical History:  Procedure Laterality Date   COLPOSCOPY W/ BIOPSY / CURETTAGE  01/14/2023   LEEP  02/19/2023   WISDOM TOOTH EXTRACTION     Patient Active Problem List   Diagnosis Date Noted   HSIL (high grade squamous intraepithelial lesion) on Pap smear of cervix 11/25/2022   Nexplanon  in place 09/19/2016    PCP: Jason Leita Repine, FNP  REFERRING PROVIDER: Joane Artist RAMAN, MD  REFERRING DIAG: Cervicalgia; Mid back pain  THERAPY DIAG:  Cervicalgia  Pain in thoracic spine  Abnormal posture  Rationale for Evaluation and Treatment: Rehabilitation  ONSET DATE: 05/19/2023   SUBJECTIVE:           SUBJECTIVE STATEMENT: Patient reports she is doing good, she is having a little tightness on the left side.  Eval: Patient reports pain in her chest, upper back, and neck. This has been going on since May 12th where she went to the hospital and she was given medication, and then she had a fall a few days later down the stairs and that made the pain a lot worse. It feels like her chest is tightening up whenever she coughs or sneezes, then when she bends her neck forward it feels like something is rushing to front of her head. She states she has not been able to sleeping in her bed since thfall.  PERTINENT HISTORY:  See PMH above  PAIN:  Are you having pain? Yes:  NPRS scale: 2/10  currently  Pain location: Neck Pain description: Tight Aggravating factors: Turning neck, bending neck forward Relieving factors: Heat, lidocaine  patches, medication  PRECAUTIONS: None  PATIENT GOALS: Pain relief so she can return to work   OBJECTIVE:  Note: Objective measures were completed at Evaluation unless otherwise noted. PATIENT SURVEYS:  NDI 23/50 (46% disability)  POSTURE:  Rounded and elevated shoulders, straightening of cervical lordosis  PALPATION: Tender to palpation bilateral upper trap and levator scap, cervical paraspinals, lower periscapular musculature   Cervical CPA hypomobile  CERVICAL ROM:   Active ROM A/PROM (deg) eval   06/25/2023   07/03/2023   07/08/2023  Flexion 40     Extension 20 50    Right lateral flexion 30     Left lateral flexion 40     Right rotation 50 70  75  Left rotation 40 55 62 70   (Blank rows = not tested)  UPPER EXTREMITY ROM: Patient demonstrates limitations in shoulder elevation and reach behind back due to pulling of periscapular musculature  07/15/2023: shoulder AROM grossly WFL  UPPER EXTREMITY MMT:  MMT Right eval Left eval  Shoulder flexion    Shoulder extension    Shoulder abduction    Shoulder adduction    Shoulder extension  Shoulder internal rotation    Shoulder external rotation    Middle trapezius    Lower trapezius    Elbow flexion    Elbow extension    Wrist flexion    Wrist extension    Wrist ulnar deviation    Wrist radial deviation    Wrist pronation    Wrist supination    Grip strength     (Blank rows = not tested)  CERVICAL SPECIAL TESTS:  Not assessed  FUNCTIONAL TESTS:  DNF endurance: < 3 seconds due to pain  07/15/2023: 12 seconds  07/22/2023: 15 seconds   TREATMENT  [facility] Adult PT Treatment:                                                DATE: 07/22/2023 UBE L3 x 5 min (fwd/bwd) to improve endurance and workload capacity Seated upper trap and levator scap stretch 3 x 10 sec  each Sidelying thoracic rotation 10 x 5 sec each Step back stretch at counter 5 x 10 sec Standing rhomboid/posterior cuff stretch at doorway 3 x 15 sec each Seated cervical retraction with red band 2 x 10 x 5 sec Row with blue 2 x 15 Deadlift with 30# 2 x 10  PATIENT EDUCATION:  Education details: HEP update Person educated: Patient Education method: Explanation, Demonstration, Tactile cues, Verbal cues, Handout Education comprehension: verbalized understanding, returned demonstration, verbal cues required, tactile cues required, and needs further education  HOME EXERCISE PROGRAM: Access Code: [email or web address]    ASSESSMENT: CLINICAL IMPRESSION: Patient tolerated therapy well with no adverse effects. Therapy focused on stretching for the neck, shoulder, and upper back with good tolerance, and progressing her neck strengthening. Initiated lifting for work conditioning with good technique and no increase in pain. Updated her HEP to incorporate neck strengthening for home. Patient would benefit from continued skilled PT to progress mobility and strength in order to reduce pain and maximize functional ability.   Eval: Patient is a 38 y.o. female who was seen today for physical therapy evaluation and treatment for chronic neck, chest, and mid-upper back pain. Her symptoms seem to be primarily musculoskeletal without any radicular presentation this visit. She demonstrates significant limitations in her cervical mobility and active motion, restricted shoulder motion that seems more related to periscapular tightness, and postural deviations. She did report improvement in symptoms following manual therapy this visit.  OBJECTIVE IMPAIRMENTS: decreased activity tolerance, decreased ROM, decreased strength, postural dysfunction, and pain.   ACTIVITY LIMITATIONS: carrying, lifting, bending, sleeping, bathing, reach over head, and hygiene/grooming  PARTICIPATION LIMITATIONS: meal prep, cleaning, driving,  shopping, community activity, and occupation  PERSONAL FACTORS: Fitness, Past/current experiences, and Time since onset of injury/illness/exacerbation are also affecting patient's functional outcome.    GOALS: Goals reviewed with patient? Yes  SHORT TERM GOALS: Target date: 07/16/2023  Patient will be I with initial HEP in order to progress with therapy. Baseline: HEP provided at eval 07/15/2023: independent with initial HEP Goal status: MET  2.  Patient will report neck, chest, and back pain </= 3/10 in order to reduce functional limitations Baseline: 5/10 07/15/2023: 3/10 Goal status: MET  3.  Patient will demonstrate shoulder AROM grossly WFL in order to improve self care and grooming tasks Baseline: patient exhibits limitations with shoulder AROM 07/15/2023: shoulder AROM grossly WFL Goal status: MET  LONG TERM GOALS: Target date: 08/13/2023  Patient will be I with final HEP to maintain progress from PT. Baseline: HEP provided at eval Goal status: INITIAL  2.  Patient will report NDI </= 5/50 (10% disability) in order to indicate an improvement in their functional status Baseline: 23/50 (46% disability) Goal status: INITIAL  3.  Patient will demonstrate cervical rotation >/= 70 deg bilaterally to improve driving Baseline: see limitations above 07/08/2023: see above Goal status: MET  4.  Patient will report pain </= 1/10 in order to reduce functional limitations and allow return to work without limitations Baseline: 5/10 Goal status: INITIAL   PLAN: PT FREQUENCY: 1x/week  PT DURATION: 8 weeks  PLANNED INTERVENTIONS: 97164- PT Re-evaluation, 97750- Physical Performance Testing, 97110-Therapeutic exercises, 97530- Therapeutic activity, 97112- Neuromuscular re-education, 97535- Self Care, 02859- Manual therapy, G0283- Electrical stimulation (unattended), 20560 (1-2 muscles), 20561 (3+ muscles)- Dry Needling, Patient/Family education, Taping, Joint mobilization, Joint  manipulation, Spinal manipulation, Spinal mobilization, Cryotherapy, and Moist heat  PLAN FOR NEXT SESSION: Review HEP and progress PRN, manual/mobs/TPDN for cervical region, continue with neck and shoulder stretching, thoracic mobility, DNF endurance and postural exercises   Elaine Daring, PT, DPT, LAT, ATC 07/22/23  11:20 AM Phone: [phone] Fax: [phone]

## 2023-07-22 NOTE — Patient Instructions (Signed)
 Access Code: [email or web address] URL: [email or web address] Date: 07/22/2023 Prepared by: Elaine Daring  Exercises - Sidelying Thoracic Lumbar Rotation  - 2 x daily - 2 sets - 5 reps - 5 seconds hold - Seated Cervical Sidebending Stretch  - 2 x daily - 2 reps - 15 seconds hold - Seated Levator Scapulae Stretch  - 2 x daily - 2 reps - 15 seconds hold - Cervical Extension AROM with Strap  - 2 x daily - 10 reps - 3 seconds hold - Seated Assisted Cervical Rotation with Towel  - 2 x daily - 10 reps - 3 seconds hold - Standing shoulder flexion wall slides  - 2 x daily - 3 reps - 5 seconds hold - Step Back Shoulder Stretch with Chair  - 2 x daily - 2 sets - 5 reps - 5 seconds hold - Cervical Retraction with Resistance  - 1 x daily - 3 sets - 10 reps - 5 seconds hold - Standing Row with Anchored Resistance  - 1 x daily - 3 sets - 10 reps - Doorway Rhomboid Stretch  - 1 x daily - 3 reps - 20 seconds hold

## 2023-07-23 NOTE — Telephone Encounter (Signed)
 Letter has been faxed.

## 2023-07-29 ENCOUNTER — Encounter: Payer: Self-pay | Admitting: Physical Therapy

## 2023-07-29 ENCOUNTER — Other Ambulatory Visit: Payer: Self-pay

## 2023-07-29 ENCOUNTER — Ambulatory Visit (INDEPENDENT_AMBULATORY_CARE_PROVIDER_SITE_OTHER): Admitting: Physical Therapy

## 2023-07-29 DIAGNOSIS — M542 Cervicalgia: Secondary | ICD-10-CM | POA: Diagnosis not present

## 2023-07-29 DIAGNOSIS — R293 Abnormal posture: Secondary | ICD-10-CM

## 2023-07-29 DIAGNOSIS — M546 Pain in thoracic spine: Secondary | ICD-10-CM

## 2023-07-29 NOTE — Patient Instructions (Signed)
 Access Code: [email or web address] URL: [email or web address] Date: 07/29/2023 Prepared by: Elaine Daring  Exercises - Sidelying Thoracic Lumbar Rotation  - 2 x daily - 2 sets - 5 reps - 5 seconds hold - Seated Cervical Sidebending Stretch  - 2 x daily - 2 reps - 15 seconds hold - Seated Levator Scapulae Stretch  - 2 x daily - 2 reps - 15 seconds hold - Cervical Extension AROM with Strap  - 2 x daily - 10 reps - 3 seconds hold - Seated Assisted Cervical Rotation with Towel  - 2 x daily - 10 reps - 3 seconds hold - Standing shoulder flexion wall slides  - 2 x daily - 3 reps - 5 seconds hold - Step Back Shoulder Stretch with Chair  - 2 x daily - 2 sets - 5 reps - 5 seconds hold - Cervical Retraction with Resistance  - 1 x daily - 3 sets - 10 reps - 5 seconds hold - Standing Row with Anchored Resistance  - 1 x daily - 3 sets - 10 reps - Doorway Rhomboid Stretch  - 1 x daily - 3 reps - 20 seconds hold

## 2023-07-29 NOTE — Therapy (Signed)
 OUTPATIENT PHYSICAL THERAPY TREATMENT  DISCHARGE   Patient Name: Dominique Webb MRN: [id number] DOB:1985-08-01, 38 y.o., female Today's Date: 07/29/2023   END OF SESSION:  PT End of Session - 07/29/23 1022     Visit Number 7    Number of Visits 9    Date for PT Re-Evaluation 08/13/23    Authorization Type [employer]    PT Start Time 1017    PT Stop Time 1055    PT Time Calculation (min) 38 min    Activity Tolerance Patient tolerated treatment well    Behavior During Therapy [facility] for tasks assessed/performed               Past Medical History:  Diagnosis Date   Medical history non-contributory    Past Surgical History:  Procedure Laterality Date   COLPOSCOPY W/ BIOPSY / CURETTAGE  01/14/2023   LEEP  02/19/2023   WISDOM TOOTH EXTRACTION     Patient Active Problem List   Diagnosis Date Noted   HSIL (high grade squamous intraepithelial lesion) on Pap smear of cervix 11/25/2022   Nexplanon  in place 09/19/2016    PCP: Jason Leita Repine, FNP  REFERRING PROVIDER: Joane Artist RAMAN, MD  REFERRING DIAG: Cervicalgia; Mid back pain  THERAPY DIAG:  Cervicalgia  Pain in thoracic spine  Abnormal posture  Rationale for Evaluation and Treatment: Rehabilitation  ONSET DATE: 05/19/2023   SUBJECTIVE:           SUBJECTIVE STATEMENT: Patient reports she still has some tightness on the left side. She starts back to work tomorrow and feels she is ready to start work and be done with therapy.  Eval: Patient reports pain in her chest, upper back, and neck. This has been going on since May 12th where she went to the hospital and she was given medication, and then she had a fall a few days later down the stairs and that made the pain a lot worse. It feels like her chest is tightening up whenever she coughs or sneezes, then when she bends her neck forward it feels like something is rushing to front of her head. She states she has not been able to sleeping in her bed since  thfall.  PERTINENT HISTORY:  See PMH above  PAIN:  Are you having pain? Yes:  NPRS scale: 2/10 currently  Pain location: Neck Pain description: Tight Aggravating factors: Turning neck, bending neck forward Relieving factors: Heat, lidocaine  patches, medication  PRECAUTIONS: None  PATIENT GOALS: Pain relief so she can return to work   OBJECTIVE:  Note: Objective measures were completed at Evaluation unless otherwise noted. PATIENT SURVEYS:  NDI 23/50 (46% disability)  07/29/2023: 12/50 (24%)  POSTURE:  Rounded and elevated shoulders, straightening of cervical lordosis  PALPATION: Tender to palpation bilateral upper trap and levator scap, cervical paraspinals, lower periscapular musculature   Cervical CPA hypomobile  CERVICAL ROM:   Active ROM A/PROM (deg) eval   06/25/2023   07/03/2023   07/08/2023  Flexion 40     Extension 20 50    Right lateral flexion 30     Left lateral flexion 40     Right rotation 50 70  75  Left rotation 40 55 62 70   (Blank rows = not tested)  UPPER EXTREMITY ROM: Patient demonstrates limitations in shoulder elevation and reach behind back due to pulling of periscapular musculature  07/15/2023: shoulder AROM grossly WFL  UPPER EXTREMITY MMT:  MMT Right eval Left eval  Shoulder flexion  Shoulder extension    Shoulder abduction    Shoulder adduction    Shoulder extension    Shoulder internal rotation    Shoulder external rotation    Middle trapezius    Lower trapezius    Elbow flexion    Elbow extension    Wrist flexion    Wrist extension    Wrist ulnar deviation    Wrist radial deviation    Wrist pronation    Wrist supination    Grip strength     (Blank rows = not tested)  CERVICAL SPECIAL TESTS:  Not assessed  FUNCTIONAL TESTS:  DNF endurance: < 3 seconds due to pain  07/15/2023: 12 seconds  07/22/2023: 15 seconds   TREATMENT  [facility] Adult PT Treatment:                                                DATE:  07/29/2023 UBE L3 x 4 min (fwd/bwd) to improve endurance and workload capacity Seated upper trap and levator scap stretch 3 x 10 sec each Sidelying thoracic rotation 10 x 5 sec each Step back stretch at counter 5 x 10 sec Standing rhomboid/posterior cuff stretch at doorway 3 x 15 sec each Deadlift with 40# 3 x 10 Bent over row supported on table with 15# 3 x 10 each Seated cervical retraction with red band 2 x 10 x 5 sec Seated double ER and scap retraction with green x 15 Seated horizontal abduction with green x 15  Discussed progress in therapy and following-up as needed as she returns to work  PATIENT EDUCATION:  Education details: POC discharge, HEP Person educated: Patient Education method: Explanation Education comprehension: verbalized understanding  HOME EXERCISE PROGRAM: Access Code: [email or web address]    ASSESSMENT: CLINICAL IMPRESSION: Patient tolerated therapy well with no adverse effects. She has made great progress in PT with her neck motion and does report improvement in her functional status. She does continue to report some tightness on the left neck region that seems to be impacting her functional ability but is able to perform her lifting tasks without pain this visit and she feels ready to return to work without restriction. No changes to HEP this visit. Patient is independent with her HEP and will be formally discharged from PT at this time.   Eval: Patient is a 38 y.o. female who was seen today for physical therapy evaluation and treatment for chronic neck, chest, and mid-upper back pain. Her symptoms seem to be primarily musculoskeletal without any radicular presentation this visit. She demonstrates significant limitations in her cervical mobility and active motion, restricted shoulder motion that seems more related to periscapular tightness, and postural deviations. She did report improvement in symptoms following manual therapy this visit.  OBJECTIVE IMPAIRMENTS:  decreased activity tolerance, decreased ROM, decreased strength, postural dysfunction, and pain.   ACTIVITY LIMITATIONS: carrying, lifting, bending, sleeping, bathing, reach over head, and hygiene/grooming  PARTICIPATION LIMITATIONS: meal prep, cleaning, driving, shopping, community activity, and occupation  PERSONAL FACTORS: Fitness, Past/current experiences, and Time since onset of injury/illness/exacerbation are also affecting patient's functional outcome.    GOALS: Goals reviewed with patient? Yes  SHORT TERM GOALS: Target date: 07/16/2023  Patient will be I with initial HEP in order to progress with therapy. Baseline: HEP provided at eval 07/15/2023: independent with initial HEP Goal status: MET  2.  Patient will report neck,  chest, and back pain </= 3/10 in order to reduce functional limitations Baseline: 5/10 07/15/2023: 3/10 Goal status: MET  3.  Patient will demonstrate shoulder AROM grossly WFL in order to improve self care and grooming tasks Baseline: patient exhibits limitations with shoulder AROM 07/15/2023: shoulder AROM grossly WFL Goal status: MET  LONG TERM GOALS: Target date: 08/13/2023  Patient will be I with final HEP to maintain progress from PT. Baseline: HEP provided at eval 07/29/2023: independent with HEP Goal status: MET  2.  Patient will report NDI </= 5/50 (10% disability) in order to indicate an improvement in their functional status Baseline: 23/50 (46% disability) 07/29/2023: 12/50 (24%) Goal status: PARTIALLY MET  3.  Patient will demonstrate cervical rotation >/= 70 deg bilaterally to improve driving Baseline: see limitations above 07/08/2023: see above Goal status: MET  4.  Patient will report pain </= 1/10 in order to reduce functional limitations and allow return to work without limitations Baseline: 5/10 07/29/2023: 2/10 Goal status: PARTIALLY MET   PLAN: PT FREQUENCY: 1x/week  PT DURATION: 8 weeks  PLANNED INTERVENTIONS: 97164- PT  Re-evaluation, 97750- Physical Performance Testing, 97110-Therapeutic exercises, 97530- Therapeutic activity, 97112- Neuromuscular re-education, 97535- Self Care, 02859- Manual therapy, G0283- Electrical stimulation (unattended), 20560 (1-2 muscles), 20561 (3+ muscles)- Dry Needling, Patient/Family education, Taping, Joint mobilization, Joint manipulation, Spinal manipulation, Spinal mobilization, Cryotherapy, and Moist heat  PLAN FOR NEXT SESSION: NA - discharge   Elaine Daring, PT, DPT, LAT, ATC 07/29/23  10:56 AM Phone: [phone] Fax: [phone]    PHYSICAL THERAPY DISCHARGE SUMMARY  Visits from Start of Care: 7  Current functional level related to goals / functional outcomes: See above   Remaining deficits: See above   Education / Equipment: HEP   Patient agrees to discharge. Patient goals were partially met. Patient is being discharged due to being pleased with the current functional level.

## 2023-08-13 ENCOUNTER — Ambulatory Visit: Payer: Self-pay

## 2023-08-13 NOTE — Telephone Encounter (Signed)
 Appt scheduled

## 2023-08-13 NOTE — Telephone Encounter (Signed)
 FYI Only or Action Required?: FYI only for provider.  Patient was last seen in primary care on 06/03/2023 by Jason Leita Repine, FNP.  Called Nurse Triage reporting Skin Problem.  Symptoms began several days ago.  Interventions attempted: Nothing.  Symptoms are: unchanged.  Triage Disposition: See PCP Within 2 Weeks  Patient/caregiver understands and will follow disposition?: Yes, will follow disposition  Copied from CRM [id number]. Topic: Clinical - Red Word Triage >> Aug 13, 2023 11:35 AM Chiquita SQUIBB wrote: Red Word that prompted transfer to Nurse Triage: Patient is calling in stating that she missed a call from the triage nurse, regarding a recent fall with a bump on her head. Patient made an appointment but was then contacted by NT. Reason for Disposition  Nursing judgment or information in reference  Answer Assessment - Initial Assessment Questions 1. REASON FOR CALL: What is your main concern right now?     Spot on head that looks like a blood clot, pt states that it is not actively bleeding, nor has it bled. Pt states that in May she fell and hit her head in that spot 2. ONSET: When did the spot start?     2 days ago 3. SEVERITY: How bad is the spot?     Size of pea 5. RELIEVING AND AGGRAVATING FACTORS: What makes it better or worse? (e.g., certain activities, rest)     Worse when touched 6. FEVER: Do you have a fever?     denies 7. OTHER SYMPTOMS: Do you have any other new symptoms?     Denies dizziness, lightheadedness, vision changes 9. PREGNANCY: Is there any chance you are pregnant? When was your last menstrual period?     denies  Protocols used: No Guideline Available-A-[facility]

## 2023-08-15 ENCOUNTER — Ambulatory Visit (INDEPENDENT_AMBULATORY_CARE_PROVIDER_SITE_OTHER): Admitting: Family

## 2023-08-15 ENCOUNTER — Encounter: Payer: Self-pay | Admitting: Family

## 2023-08-15 VITALS — BP 106/64 | Ht 62.0 in | Wt 127.4 lb

## 2023-08-15 DIAGNOSIS — L739 Follicular disorder, unspecified: Secondary | ICD-10-CM | POA: Diagnosis not present

## 2023-08-15 NOTE — Progress Notes (Signed)
 ESCARLET SAATHOFF is a 38 y.o. female with the following history as recorded in EpicCare:  Patient Active Problem List   Diagnosis Date Noted   HSIL (high grade squamous intraepithelial lesion) on Pap smear of cervix 11/25/2022   Nexplanon  in place 09/19/2016    Current Outpatient Medications  Medication Sig Dispense Refill   clotrimazole  (LOTRIMIN ) 1 % cream Apply 1 Application topically 2 (two) times daily. 30 g 0   cyclobenzaprine  (FLEXERIL ) 10 MG tablet Take 1 tablet (10 mg total) by mouth 2 (two) times daily as needed for muscle spasms. 60 tablet 1   Etonogestrel  (NEXPLANON  [place]) Inject into the skin. Placed 2022     lidocaine  (LIDODERM ) 5 % Place 1 patch onto the skin daily. Remove & Discard patch within 12 hours or as directed by MD 30 patch 0   lidocaine  (LIDODERM ) 5 % Place 1 patch onto the skin daily. Remove & Discard patch within 12 hours or as directed by MD 15 patch 0   Multiple Vitamins-Minerals (HAIR SKIN AND NAILS FORMULA PO) Take by mouth daily.     naproxen  (NAPROSYN ) 500 MG tablet Take 1 tablet (500 mg total) by mouth 3 times/day as needed-between meals & bedtime. 60 tablet 1   No current facility-administered medications for this visit.    Allergies: Aspirin  Past Medical History:  Diagnosis Date   Medical history non-contributory     Past Surgical History:  Procedure Laterality Date   COLPOSCOPY W/ BIOPSY / CURETTAGE  01/14/2023   LEEP  02/19/2023   WISDOM TOOTH EXTRACTION      Family History  Problem Relation Age of Onset   Diabetes Mother    Hypertension Mother    Hyperlipidemia Mother    Stroke Father    Hypertension Father     Social History   Tobacco Use   Smoking status: Every Day    Current packs/day: 0.00    Average packs/day: 0.5 packs/day for 18.2 years (9.1 ttl pk-yrs)    Types: Cigarettes    Start date: 2004    Last attempt to quit: 04/07/2020    Years since quitting: 3.3   Smokeless tobacco: Never   Tobacco comments:    pt smokes some days  - quit with pregnancy  Substance Use Topics   Alcohol use: Yes    Comment: Social    Subjective:   Spot on scalp- mother notes that she first noticed 3 days ago while oiling her daughter's scalp; patient denies any pain or concerns today; just wanted to make sure that not related to recent fall earlier this year; does bring picture of area- appears mild erythema at follicle;   Objective:  Vitals:   08/15/23 1439  BP: 106/64  Weight: 127 lb 6.4 oz (57.8 kg)  Height: 5' 2 (1.575 m)    General: Well developed, well nourished, in no acute distress  Skin : Warm and dry. No abnormal lesion noted on scalp Head: Normocephalic and atraumatic  Lungs: Respirations unlabored;  Neurologic: Alert and oriented; speech intact; face symmetrical; moves all extremities well; CNII-XII intact without focal deficit   Assessment:  1. Folliculitis     Plan:  Based on picture provided and symptoms described, suspect mother was noticing an infected hair follicle; physical exam is reassuring today- no abnormality noted; reassurance that this area of concern would not have anything to do with her fall 3 months ago;   No follow-ups on file.  No orders of the defined types were placed in  this encounter.   Requested Prescriptions    No prescriptions requested or ordered in this encounter

## 2023-09-22 ENCOUNTER — Other Ambulatory Visit (HOSPITAL_COMMUNITY)
Admission: RE | Admit: 2023-09-22 | Discharge: 2023-09-22 | Disposition: A | Discharge status: Home / Self Care | Source: Ambulatory Visit | Attending: Family Medicine | Admitting: Family Medicine

## 2023-09-22 ENCOUNTER — Encounter: Payer: Self-pay | Admitting: Family Medicine

## 2023-09-22 ENCOUNTER — Ambulatory Visit: Admitting: Family Medicine

## 2023-09-22 ENCOUNTER — Other Ambulatory Visit: Payer: Self-pay

## 2023-09-22 VITALS — BP 113/73 | HR 58 | Wt 124.7 lb

## 2023-09-22 DIAGNOSIS — Z1331 Encounter for screening for depression: Secondary | ICD-10-CM

## 2023-09-22 DIAGNOSIS — Z3046 Encounter for surveillance of implantable subdermal contraceptive: Secondary | ICD-10-CM

## 2023-09-22 DIAGNOSIS — R87613 High grade squamous intraepithelial lesion on cytologic smear of cervix (HGSIL): Secondary | ICD-10-CM | POA: Diagnosis present

## 2023-09-22 MED ORDER — ETONOGESTREL 68 MG ~~LOC~~ IMPL
68.0000 mg | DRUG_IMPLANT | Freq: Once | SUBCUTANEOUS | Status: AC
  Administered 2023-09-22: 68 mg via SUBCUTANEOUS

## 2023-09-22 NOTE — Progress Notes (Signed)
   Subjective:    Patient ID: Dominique Webb is a 38 y.o. female presenting with Procedure  on 09/22/2023  HPI: Here today for Nexplanon  change. 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 For repeat pap today.  Review of Systems  Constitutional:  Negative for chills and fever.  Respiratory:  Negative for shortness of breath.   Cardiovascular:  Negative for chest pain.  Gastrointestinal:  Negative for abdominal pain, nausea and vomiting.  Genitourinary:  Negative for dysuria.  Skin:  Negative for rash.      Objective:    BP 113/73   Pulse (!) 58   Wt 124 lb 11.2 oz (56.6 kg)   BMI 22.81 kg/m  Physical Exam Exam conducted with a chaperone present.  Constitutional:      General: She is not in acute distress.    Appearance: She is well-developed.  HENT:     Head: Normocephalic and atraumatic.  Eyes:     General: No scleral icterus. Cardiovascular:     Rate and Rhythm: Normal rate.  Pulmonary:     Effort: Pulmonary effort is normal.  Abdominal:     Palpations: Abdomen is soft.  Genitourinary:    General: Normal vulva.     Vagina: Normal.     Cervix: Normal. No lesion.  Musculoskeletal:     Cervical back: Neck supple.  Skin:    General: Skin is warm and dry.  Neurological:     Mental Status: She is alert and oriented to person, place, and time.   Procedure: Patient given informed consent for removal of her Nexplanon .   Appropriate time out taken. Nexplanon  site identified.  Area prepped in usual sterile fashon. Three cc of 1% lidocaine  was used to anesthetize the area at the distal end of the implant. A small stab incision was made right beside the implant on the distal portion.  The Nexplanon  rod was grasped using hemostats and removed without difficulty.   Procedure: Nexplanon  removed form packaging,  Device confirmed in needle, then inserted full length of needle and withdrawn per handbook instructions. Steri-strips were applied over the small  incision.  A pressure bandage was applied to reduce any bruising.  The patient tolerated the procedure well and was given post procedure instructions. Minimal blood loss.  Pt tolerated the procedure well.        Assessment & Plan:   Problem List Items Addressed This Visit       Unprioritized   HSIL (high grade squamous intraepithelial lesion) on Pap smear of cervix   S/p repeat pap today.      Relevant Orders   Cytology - PAP( [place])   Other Visit Diagnoses       Encounter for removal and reinsertion of Nexplanon     -  Primary   Desires to continue Nexplanon  for birth control. Does not get cycles with it. Does not desire pregnancy at this time.   Relevant Medications   etonogestrel  (NEXPLANON ) implant 68 mg (Completed)        Return in about 1 year (around 09/21/2024) for a CPE.  Glenys GORMAN Birk, MD 09/22/2023 9:44 AM

## 2023-09-22 NOTE — Assessment & Plan Note (Signed)
S/p repeat pap today.

## 2023-09-24 ENCOUNTER — Ambulatory Visit: Payer: Self-pay | Admitting: Family Medicine

## 2023-09-24 LAB — CYTOLOGY - PAP
Adequacy: ABSENT
Comment: NEGATIVE
Diagnosis: NEGATIVE
High risk HPV: NEGATIVE

## 2023-09-26 NOTE — Progress Notes (Signed)
 [facility] [address] [place], [place] [address] [phone]  Date:  09/29/2023   Name:  Dominique Webb   DOB:  May 04, 1985   MRN:  [id number]  PCP:  Jason Leita Repine, FNP    Chief Complaint: No chief complaint on file.   History of Present Illness:  Dominique Webb is a 38 y.o. very pleasant female patient who presents with the following:  Patient seen today with a concern about her neck.  She had a fall earlier this year and injured herself Otherwise generally in good health, history of abnormal Pap I have not seen her myself previously  Pulse Readings from Last 3 Encounters:  09/29/23 (!) 51  09/22/23 (!) 58  06/25/23 (!) 55     Discussed the use of AI scribe software for clinical note transcription with the patient, who gave verbal consent to proceed.  History of Present Illness Dominique Webb is a 38 year old female who presents with persistent neck pain following a fall down stairs in May.  In May, she fell down thirteen steps at her home, which were wet due to rain. She recalls her foot slipping and believes she may have been knocked unconscious as she does not remember the entire fall. Following the fall, she was evaluated at the hospital where a CT scan of her neck and an MRI of her head were performed. The MRI was conducted due to suspected concussion symptoms, which have since resolved.  She experiences ongoing neck pain that has not improved since the fall. She describes a 'popping sound' when bending her neck and soreness extending into her shoulders. No numbness or tingling in her arms, although she once experienced numbness in her hand upon waking, which she attributes to sleeping on it. She also denies any weakness in her arms.  She works in a [employer] and notes difficulty with lifting, feeling as though she might drop items due to a lack of 'pressure pull strength'. She has been using  lidocaine  patches for pain relief, which she finds helpful. She is allergic to aspirin, which causes stomach bleeding, but can tolerate Advil .  No current concussion symptoms, numbness, tingling, or weakness in the arms. Occasionally experiences headaches, but nothing out of the ordinary.  She notes no chance of current pregnancy  Patient Active Problem List   Diagnosis Date Noted   HSIL (high grade squamous intraepithelial lesion) on Pap smear of cervix 11/25/2022   Nexplanon  in place 09/19/2016    Past Medical History:  Diagnosis Date   Medical history non-contributory     Past Surgical History:  Procedure Laterality Date   COLPOSCOPY W/ BIOPSY / CURETTAGE  01/14/2023   LEEP  02/19/2023   WISDOM TOOTH EXTRACTION      Social History   Tobacco Use   Smoking status: Every Day    Current packs/day: 0.00    Average packs/day: 0.5 packs/day for 18.2 years (9.1 ttl pk-yrs)    Types: Cigarettes    Start date: 2004    Last attempt to quit: 04/07/2020    Years since quitting: 3.4   Smokeless tobacco: Never   Tobacco comments:    pt smokes some days - quit with pregnancy  Vaping Use   Vaping status: Never Used  Substance Use Topics   Alcohol use: Yes    Comment: Social   Drug use: No    Family History  Problem Relation Age  of Onset   Diabetes Mother    Hypertension Mother    Hyperlipidemia Mother    Stroke Father    Hypertension Father     Allergies  Allergen Reactions   Aspirin Other (See Comments)    stomach bleeds Can take Advil     Medication list has been reviewed and updated.  Current Outpatient Medications on File Prior to Visit  Medication Sig Dispense Refill   clotrimazole  (LOTRIMIN ) 1 % cream Apply 1 Application topically 2 (two) times daily. (Patient not taking: Reported on 09/22/2023) 30 g 0   cyclobenzaprine  (FLEXERIL ) 10 MG tablet Take 1 tablet (10 mg total) by mouth 2 (two) times daily as needed for muscle spasms. (Patient not taking: Reported on  09/22/2023) 60 tablet 1   Etonogestrel  (NEXPLANON  [place]) Inject into the skin. Placed 2022     lidocaine  (LIDODERM ) 5 % Place 1 patch onto the skin daily. Remove & Discard patch within 12 hours or as directed by MD 30 patch 0   lidocaine  (LIDODERM ) 5 % Place 1 patch onto the skin daily. Remove & Discard patch within 12 hours or as directed by MD 15 patch 0   Multiple Vitamins-Minerals (HAIR SKIN AND NAILS FORMULA PO) Take by mouth daily.     naproxen  (NAPROSYN ) 500 MG tablet Take 1 tablet (500 mg total) by mouth 3 times/day as needed-between meals & bedtime. 60 tablet 1   No current facility-administered medications on file prior to visit.    Review of Systems:  As per HPI- otherwise negative.   Physical Examination: Vitals:   09/29/23 1108  BP: 121/77  Pulse: (!) 51   Vitals:   09/29/23 1108  Weight: 127 lb (57.6 kg)  Height: 5' 2 (1.575 m)   Body mass index is 23.23 kg/m. Ideal Body Weight: Weight in (lb) to have BMI = 25: 136.4  GEN: no acute distress.  Normal weight, looks well HEENT: Atraumatic, Normocephalic.  Ears and Nose: No external deformity. CV: RRR, No M/G/R. No JVD. No thrill. No extra heart sounds. PULM: CTA B, no wheezes, crackles, rhonchi. No retractions. No resp. distress. No accessory muscle use. ABD: S, NT, ND, +BS. No rebound. No HSM. EXTR: No c/c/e PSYCH: Normally interactive. Conversant.  No bony cervical spine tenderness.  However she is tender in the paracervical musculature especially on the left, and also in the left trapezius muscles.  Cervical rotation is greater to the right than to the left, normal flexion and extension.  Normal bilateral upper extremity strength, sensation, deep tendon reflex  Assessment and Plan: Cervical paraspinal muscle spasm - Plan: cyclobenzaprine  (FLEXERIL ) 10 MG tablet  Need for influenza vaccination - Plan: Flu vaccine trivalent PF, 6mos and older(Flulaval,Afluria,Fluarix,Fluzone)  Assessment & Plan Neck pain with  muscle spasm after fall Chronic neck pain with muscle spasm since May fall. Imaging showed no fractures or arthritis. No MRI performed. Differential includes muscle spasm versus disc or nerve issues, less likely without radicular symptoms. MRI considered if no improvement. - Prescribed Flexeril  at bedtime. - Advised Advil  in the morning.  However, use caution given history of gastritis.  Stop use of any stomach irritation - Recommended lidocaine  patches during the day if helpful. - Follow-up in two weeks to assess progress and consider further imaging or physical therapy if symptoms persist.  Aspirin allergy Allergy to aspirin causing stomach bleeding. - Avoid aspirin due to allergy.  General Health Maintenance Administered flu vaccination to prevent influenza. - Administered flu shot during the visit.  Signed Harlene  Tamma Brigandi, MD

## 2023-09-26 NOTE — Patient Instructions (Addendum)
 It was great to see you today-  Flu shot today  For your neck- recommend heat and lidocaine  patches as needed Flexeril / muscle relaxer at bedtime and advil  in the am for about 2 weeks (can stop advil  earlier esp if you have any stomach upset or pain!)   Please let me now how you are doing over the next couple of weeks - if you are not getting back to normal we might consider -neck x-ray -neck MRI -PT or orthopedics referral

## 2023-09-29 ENCOUNTER — Encounter: Payer: Self-pay | Admitting: Family Medicine

## 2023-09-29 ENCOUNTER — Ambulatory Visit (INDEPENDENT_AMBULATORY_CARE_PROVIDER_SITE_OTHER): Admitting: Family Medicine

## 2023-09-29 VITALS — BP 121/77 | HR 51 | Ht 62.0 in | Wt 127.0 lb

## 2023-09-29 DIAGNOSIS — M62838 Other muscle spasm: Secondary | ICD-10-CM | POA: Diagnosis not present

## 2023-09-29 DIAGNOSIS — Z23 Encounter for immunization: Secondary | ICD-10-CM

## 2023-09-29 MED ORDER — CYCLOBENZAPRINE HCL 10 MG PO TABS: 10.0000 mg | ORAL_TABLET | Freq: Every day | ORAL | 0 refills | Status: DC

## 2023-10-27 ENCOUNTER — Encounter: Payer: Self-pay | Admitting: Family Medicine

## 2023-10-27 DIAGNOSIS — G8929 Other chronic pain: Secondary | ICD-10-CM

## 2023-10-27 DIAGNOSIS — R29898 Other symptoms and signs involving the musculoskeletal system: Secondary | ICD-10-CM

## 2023-10-28 NOTE — Addendum Note (Signed)
 Addended by: WATT RAISIN C on: 10/28/2023 03:25 PM   Modules accepted: Orders

## 2023-11-14 ENCOUNTER — Ambulatory Visit
Admission: RE | Admit: 2023-11-14 | Discharge: 2023-11-14 | Disposition: A | Discharge status: Home / Self Care | Source: Ambulatory Visit | Attending: Family Medicine | Admitting: Family Medicine

## 2023-11-14 DIAGNOSIS — G8929 Other chronic pain: Secondary | ICD-10-CM

## 2023-11-14 DIAGNOSIS — R29898 Other symptoms and signs involving the musculoskeletal system: Secondary | ICD-10-CM

## 2023-11-18 ENCOUNTER — Encounter: Payer: Self-pay | Admitting: Family Medicine

## 2023-11-18 DIAGNOSIS — R29898 Other symptoms and signs involving the musculoskeletal system: Secondary | ICD-10-CM

## 2023-11-18 DIAGNOSIS — G8929 Other chronic pain: Secondary | ICD-10-CM

## 2023-11-28 NOTE — Progress Notes (Unsigned)
 Referring Physician:  Watt Harlene BROCKS, MD [address] [place],  [place] [address]  Primary Physician:  Jason Leita Repine, FNP (Inactive)  History of Present Illness: 11/28/2023 Dominique Webb is here today with a chief complaint of *** Had a fall down stairs in May Neck pain and popping in her neck when looking down. No complaints of numbness, tingling or weakness.   Conservative measures:  Physical therapy: *** has participated in PT at [facility] and was discharged Multimodal medical therapy including regular antiinflammatories: *** Flexeril , lidocaine  patch, Naproxen  Injections: no epidural steroid injections  Past Surgery: ***none  Dominique Webb has ***no symptoms of cervical myelopathy.  The symptoms are causing a significant impact on the patient's life.   I have utilized the care everywhere function in epic to review the outside records available from external health systems.  Review of Systems:  A 10 point review of systems is negative, except for the pertinent positives and negatives detailed in the HPI.  Past Medical History: Past Medical History:  Diagnosis Date   Medical history non-contributory     Past Surgical History: Past Surgical History:  Procedure Laterality Date   COLPOSCOPY W/ BIOPSY / CURETTAGE  01/14/2023   LEEP  02/19/2023   WISDOM TOOTH EXTRACTION      Allergies: Allergies as of 12/08/2023 - Review Complete 09/29/2023  Allergen Reaction Noted   Aspirin Other (See Comments) 03/23/2011    Medications:  Current Outpatient Medications:    clotrimazole  (LOTRIMIN ) 1 % cream, Apply 1 Application topically 2 (two) times daily. (Patient not taking: Reported on 09/22/2023), Disp: 30 g, Rfl: 0   cyclobenzaprine  (FLEXERIL ) 10 MG tablet, Take 1 tablet (10 mg total) by mouth 2 (two) times daily as needed for muscle spasms. (Patient not taking: Reported on 09/22/2023), Disp: 60 tablet, Rfl: 1   cyclobenzaprine   (FLEXERIL ) 10 MG tablet, Take 1 tablet (10 mg total) by mouth at bedtime. Use as needed for muscle spasm, Disp: 30 tablet, Rfl: 0   Etonogestrel  (NEXPLANON  [place]), Inject into the skin. Placed 2022, Disp: , Rfl:    lidocaine  (LIDODERM ) 5 %, Place 1 patch onto the skin daily. Remove & Discard patch within 12 hours or as directed by MD, Disp: 30 patch, Rfl: 0   lidocaine  (LIDODERM ) 5 %, Place 1 patch onto the skin daily. Remove & Discard patch within 12 hours or as directed by MD, Disp: 15 patch, Rfl: 0   Multiple Vitamins-Minerals (HAIR SKIN AND NAILS FORMULA PO), Take by mouth daily., Disp: , Rfl:    naproxen  (NAPROSYN ) 500 MG tablet, Take 1 tablet (500 mg total) by mouth 3 times/day as needed-between meals & bedtime., Disp: 60 tablet, Rfl: 1  Social History: Social History   Tobacco Use   Smoking status: Every Day    Current packs/day: 0.00    Average packs/day: 0.5 packs/day for 18.2 years (9.1 ttl pk-yrs)    Types: Cigarettes    Start date: 2004    Last attempt to quit: 04/07/2020    Years since quitting: 3.6   Smokeless tobacco: Never   Tobacco comments:    pt smokes some days - quit with pregnancy  Vaping Use   Vaping status: Never Used  Substance Use Topics   Alcohol use: Yes    Comment: Social   Drug use: No    Family Medical History: Family History  Problem Relation Age of Onset   Diabetes Mother    Hypertension Mother  Hyperlipidemia Mother    Stroke Father    Hypertension Father     Physical Examination: There were no vitals filed for this visit.  General: Patient is in no apparent distress. Attention to examination is appropriate.  Neck:   Supple.  Full range of motion.  Respiratory: Patient is breathing without any difficulty.   NEUROLOGICAL:     Awake, alert, oriented to person, place, and time.  Speech is clear and fluent.   Cranial Nerves: Pupils equal round and reactive to light.  Facial tone is symmetric.  Facial sensation is symmetric. Shoulder  shrug is symmetric. Tongue protrusion is midline.    Strength: Side Biceps Triceps Deltoid Interossei Grip Wrist Ext. Wrist Flex.  R 5 5 5 5 5 5 5   L 5 5 5 5 5 5 5    Side Iliopsoas Quads Hamstring PF DF EHL  R 5 5 5 5 5 5   L 5 5 5 5 5 5    Reflexes are ***2+ and symmetric at the biceps, triceps, brachioradialis, patella and achilles.   Hoffman's is absent. Clonus is absent  Bilateral upper and lower extremity sensation is intact to light touch ***.     No evidence of dysmetria noted.  Gait is normal.    Imaging: MR Cervical Spine Wo Contrast Result Date: 11/18/2023 CLINICAL DATA:  Neck pain after fall EXAM: MRI CERVICAL SPINE WITHOUT CONTRAST TECHNIQUE: Multiplanar, multisequence MR imaging of the cervical spine was performed. No intravenous contrast was administered. COMPARISON:  CT 05/24/2023 FINDINGS: Alignment: Straightening of the cervical lordosis. No significant listhesis. Vertebrae: No fracture, evidence of discitis, or bone lesion. Cord: Normal signal and morphology. Posterior Fossa, vertebral arteries, paraspinal tissues: Negative. Disc levels: C2-C3: No significant disc protrusion, foraminal stenosis, or canal stenosis. C3-C4: Disc osteophyte complex and right-sided uncovertebral spurring. Moderate right foraminal stenosis. Mild canal stenosis. C4-C5: Disc osteophyte complex and right-sided uncovertebral spurring. Moderate right foraminal stenosis. No significant canal stenosis. C5-C6: Disc osteophyte complex with bilateral uncovertebral spurring. Moderate canal stenosis with moderate-severe left and moderate right foraminal stenosis. C6-C7: No significant disc protrusion, foraminal stenosis, or canal stenosis. C7-T1: No significant disc protrusion, foraminal stenosis, or canal stenosis. IMPRESSION: 1. Multilevel cervical spondylosis, most pronounced at the C5-6 level where there is moderate canal stenosis with moderate-severe left and moderate right foraminal stenosis. 2. Moderate  right foraminal stenosis at C3-4 and C4-5. Electronically Signed   By: Mabel Converse D.O.   On: 11/18/2023 11:54    I have personally reviewed the images and agree with the above interpretation.  Medical Decision Making/Assessment and Plan: Ms. Fleener is a pleasant 38 y.o. female with ***  There are no diagnoses linked to this encounter.   Thank you for involving me in the care of this patient.    Penne MICAEL Sharps MD/MSCR Neurosurgery

## 2023-12-08 ENCOUNTER — Ambulatory Visit: Admitting: Neurosurgery

## 2023-12-08 ENCOUNTER — Ambulatory Visit

## 2023-12-08 ENCOUNTER — Encounter: Payer: Self-pay | Admitting: Neurosurgery

## 2023-12-08 VITALS — BP 112/78 | Ht 62.0 in | Wt 133.0 lb

## 2023-12-08 DIAGNOSIS — W19XXXA Unspecified fall, initial encounter: Secondary | ICD-10-CM | POA: Diagnosis not present

## 2023-12-08 DIAGNOSIS — M542 Cervicalgia: Secondary | ICD-10-CM | POA: Diagnosis not present

## 2023-12-08 DIAGNOSIS — M47812 Spondylosis without myelopathy or radiculopathy, cervical region: Secondary | ICD-10-CM | POA: Diagnosis not present

## 2023-12-16 ENCOUNTER — Ambulatory Visit: Payer: Self-pay | Admitting: Neurosurgery

## 2024-01-09 ENCOUNTER — Ambulatory Visit (HOSPITAL_COMMUNITY)
Admission: EM | Admit: 2024-01-09 | Discharge: 2024-01-09 | Disposition: A | Discharge status: Home / Self Care | Attending: Physician Assistant | Admitting: Physician Assistant

## 2024-01-09 ENCOUNTER — Encounter (HOSPITAL_COMMUNITY): Payer: Self-pay

## 2024-01-09 DIAGNOSIS — J111 Influenza due to unidentified influenza virus with other respiratory manifestations: Secondary | ICD-10-CM | POA: Diagnosis not present

## 2024-01-09 DIAGNOSIS — R051 Acute cough: Secondary | ICD-10-CM | POA: Diagnosis not present

## 2024-01-09 DIAGNOSIS — R112 Nausea with vomiting, unspecified: Secondary | ICD-10-CM | POA: Diagnosis not present

## 2024-01-09 LAB — POCT INFLUENZA A/B
Influenza A, POC: NEGATIVE
Influenza B, POC: NEGATIVE

## 2024-01-09 LAB — POC SOFIA SARS ANTIGEN FIA: SARS Coronavirus 2 Ag: NEGATIVE

## 2024-01-09 MED ORDER — OSELTAMIVIR PHOSPHATE 75 MG PO CAPS: 75.0000 mg | ORAL_CAPSULE | Freq: Two times a day (BID) | ORAL | 0 refills | Status: DC

## 2024-01-09 MED ORDER — ONDANSETRON 4 MG PO TBDP: 4.0000 mg | ORAL_TABLET | Freq: Three times a day (TID) | ORAL | 0 refills | Status: DC | PRN

## 2024-01-09 MED ORDER — ONDANSETRON 4 MG PO TBDP
4.0000 mg | ORAL_TABLET | Freq: Once | ORAL | Status: AC
  Administered 2024-01-09: 4 mg via ORAL

## 2024-01-09 MED ORDER — BENZONATATE 100 MG PO CAPS: 100.0000 mg | ORAL_CAPSULE | Freq: Three times a day (TID) | ORAL | 0 refills | Status: AC

## 2024-01-09 MED ORDER — ONDANSETRON 4 MG PO TBDP
ORAL_TABLET | ORAL | Status: AC
  Filled 2024-01-09: qty 1

## 2024-01-09 NOTE — Discharge Instructions (Signed)
 You tested negative for COVID and flu.  I suspect that your flu test is falsely negative given exposure so I would like you to start Tamiflu twice a day for 5 days.  This can cause nausea and vomiting as well as abnormal dreams/behaviors.  If you have any side effects stop the medication to be seen immediately.  Use Zofran  every 8 hours as needed for nausea.  Eat a bland diet and drink plenty of fluid.  Use Tessalon for cough.  You can use over-the-counter medication including Mucinex, Flonase , nasal saline/sinus rinses, Tylenol /ibuprofen .  If you are not feeling better within a week please return for reevaluation.  If anything worsens and you have high fever, worsening cough, shortness of breath, chest pain, nausea/vomiting despite the medication, weakness you need to be seen immediately.

## 2024-01-09 NOTE — ED Triage Notes (Signed)
 Patient reports that she has had body aches, emesis, chills, and a productive cough with yellow sputum x 2 days.  Patient states she has not had any meds for her symptoms.

## 2024-01-09 NOTE — ED Provider Notes (Signed)
 " [facility]    CSN: [id number] Arrival date & time: 01/09/24  9187      History   Chief Complaint Chief Complaint  Patient presents with   Generalized Body Aches   Emesis   Chills   Cough    HPI Dominique Webb is a 39 y.o. female.   Patient presents today with a 2-day history of URI symptoms.  She reports body aches, emesis, chills, cough, scratchy throat.  Denies any diarrhea, shortness of breath, measured fever.  She does report that multiple people in her household had a flu around the time of Christmas but she thought that she had managed to avoid getting sick until she developed symptoms a few days ago.  She has not been taking any over-the-counter medication for symptom management.  She denies any history of allergies, asthma, COPD.  Denies any recent antibiotics or steroids.  She did have influenza vaccine at the end of 2025.  She has no concern for pregnancy as she has a Nexplanon  in place.    Past Medical History:  Diagnosis Date   Medical history non-contributory     Patient Active Problem List   Diagnosis Date Noted   HSIL (high grade squamous intraepithelial lesion) on Pap smear of cervix 11/25/2022   Nexplanon  in place 09/19/2016    Past Surgical History:  Procedure Laterality Date   COLPOSCOPY W/ BIOPSY / CURETTAGE  01/14/2023   LEEP  02/19/2023   WISDOM TOOTH EXTRACTION      OB History     Gravida  1   Para  1   Term  1   Preterm      AB      Living  1      SAB      IAB      Ectopic      Multiple  0   Live Births  1            Home Medications    Prior to Admission medications  Medication Sig Start Date End Date Taking? Authorizing Provider  benzonatate (TESSALON) 100 MG capsule Take 1 capsule (100 mg total) by mouth every 8 (eight) hours. 01/09/24  Yes Sierra Spargo K, PA-C  ondansetron  (ZOFRAN -ODT) 4 MG disintegrating tablet Take 1 tablet (4 mg total) by mouth every 8 (eight) hours as needed for nausea or  vomiting. 01/09/24  Yes Hoang Pettingill, Rocky POUR, PA-C  oseltamivir (TAMIFLU) 75 MG capsule Take 1 capsule (75 mg total) by mouth every 12 (twelve) hours. 01/09/24  Yes Duey Liller K, PA-C  Etonogestrel  (NEXPLANON  [place]) Inject into the skin. Placed 2022    [provider]  Multiple Vitamins-Minerals (HAIR SKIN AND NAILS FORMULA PO) Take by mouth daily.    [provider]    Family History Family History  Problem Relation Age of Onset   Diabetes Mother    Hypertension Mother    Hyperlipidemia Mother    Stroke Father    Hypertension Father     Social History Social History[1]   Allergies   Aspirin   Review of Systems Review of Systems  Constitutional:  Positive for activity change and chills. Negative for appetite change, fatigue and fever.  HENT:  Positive for congestion and sore throat (scratchy).   Respiratory:  Positive for cough. Negative for shortness of breath.   Cardiovascular:  Negative for chest pain.  Gastrointestinal:  Positive for nausea and vomiting. Negative for abdominal pain and diarrhea.  Musculoskeletal:  Positive for  arthralgias and myalgias.     Physical Exam Triage Vital Signs ED Triage Vitals  Encounter Vitals Group     BP 01/09/24 0853 115/69     Girls Systolic BP Percentile --      Girls Diastolic BP Percentile --      Boys Systolic BP Percentile --      Boys Diastolic BP Percentile --      Pulse Rate 01/09/24 0853 (!) 53     Resp 01/09/24 0853 14     Temp 01/09/24 0853 98.7 F (37.1 C)     Temp Source 01/09/24 0853 Oral     SpO2 01/09/24 0853 99 %     Weight --      Height --      Head Circumference --      Peak Flow --      Pain Score 01/09/24 0852 5     Pain Loc --      Pain Education --      Exclude from Growth Chart --    No data found.  Updated Vital Signs BP 115/69 (BP Location: Left Arm)   Pulse (!) 53   Temp 98.7 F (37.1 C) (Oral)   Resp 14   SpO2 99%   Visual Acuity Right Eye Distance:   Left Eye Distance:    Bilateral Distance:    Right Eye Near:   Left Eye Near:    Bilateral Near:     Physical Exam Vitals reviewed.  Constitutional:      General: She is awake. She is not in acute distress.    Appearance: Normal appearance. She is well-developed. She is not ill-appearing.     Comments: Very pleasant female appears stated age in no acute distress sitting comfortably in exam room  HENT:     Head: Normocephalic and atraumatic.     Right Ear: Tympanic membrane, ear canal and external ear normal. Tympanic membrane is not erythematous or bulging.     Left Ear: Tympanic membrane, ear canal and external ear normal. Tympanic membrane is not erythematous or bulging.     Nose:     Right Sinus: No maxillary sinus tenderness or frontal sinus tenderness.     Left Sinus: No maxillary sinus tenderness or frontal sinus tenderness.     Mouth/Throat:     Pharynx: Uvula midline. Postnasal drip present. No oropharyngeal exudate or posterior oropharyngeal erythema.  Cardiovascular:     Rate and Rhythm: Normal rate and regular rhythm.     Heart sounds: Normal heart sounds, S1 normal and S2 normal. No murmur heard. Pulmonary:     Effort: Pulmonary effort is normal.     Breath sounds: Normal breath sounds. No wheezing, rhonchi or rales.     Comments: Clear to auscultation bilaterally Psychiatric:        Behavior: Behavior is cooperative.      UC Treatments / Results  Labs (all labs ordered are listed, but only abnormal results are displayed) Labs Reviewed  POCT INFLUENZA A/B  POC SOFIA SARS ANTIGEN FIA    EKG   Radiology No results found.  Procedures Procedures (including critical care time)  Medications Ordered in UC Medications  ondansetron  (ZOFRAN -ODT) disintegrating tablet 4 mg (4 mg Oral Given 01/09/24 9077)    Initial Impression / Assessment and Plan / UC Course  I have reviewed the triage vital signs and the nursing notes.  Pertinent labs & imaging results that were available  during my care of the patient were  reviewed by me and considered in my medical decision making (see chart for details).     Patient is well-appearing, afebrile, nontoxic, nontachycardic.  She tested negative for influenza and COVID in clinic though I suspect her flu test is falsely negative given exposure and clinical presentation.  Chest x-ray was deferred as she had no adventitious lung sounds on exam and her oxygen saturation was 99%.  Will empirically treat with Tamiflu twice daily for 5 days.  No indication for dose adjustment based on metabolic panel from 05/24/2023 with a creatinine of 1.11 and calculated creatinine clearance of 64 mL/min.  She was given Tessalon for cough as well as Zofran  for nausea.  We discussed that she is to drink plenty of fluid and eat small frequent meals.  She can use over-the-counter medication including Tylenol , ibuprofen , Mucinex, nasal saline/sinus rinses, Flonase .  We discussed that if she is not feeling better she should return within a week for reevaluation.  If any point she has worsening symptoms including high fever not responding to antipyretics, worsening cough, shortness of breath, chest pain, nausea/vomit despite antiemetic medication she needs to be seen emergently.  Return precautions given.  Excuse note provided.  Final Clinical Impressions(s) / UC Diagnoses   Final diagnoses:  Acute cough  Nausea and vomiting, unspecified vomiting type  Influenza-like illness     Discharge Instructions      You tested negative for COVID and flu.  I suspect that your flu test is falsely negative given exposure so I would like you to start Tamiflu twice a day for 5 days.  This can cause nausea and vomiting as well as abnormal dreams/behaviors.  If you have any side effects stop the medication to be seen immediately.  Use Zofran  every 8 hours as needed for nausea.  Eat a bland diet and drink plenty of fluid.  Use Tessalon for cough.  You can use over-the-counter  medication including Mucinex, Flonase , nasal saline/sinus rinses, Tylenol /ibuprofen .  If you are not feeling better within a week please return for reevaluation.  If anything worsens and you have high fever, worsening cough, shortness of breath, chest pain, nausea/vomiting despite the medication, weakness you need to be seen immediately.     ED Prescriptions     Medication Sig Dispense Auth. Provider   oseltamivir (TAMIFLU) 75 MG capsule Take 1 capsule (75 mg total) by mouth every 12 (twelve) hours. 10 capsule Jakiah Bienaime K, PA-C   ondansetron  (ZOFRAN -ODT) 4 MG disintegrating tablet Take 1 tablet (4 mg total) by mouth every 8 (eight) hours as needed for nausea or vomiting. 12 tablet Amera Banos K, PA-C   benzonatate (TESSALON) 100 MG capsule Take 1 capsule (100 mg total) by mouth every 8 (eight) hours. 21 capsule Analynn Daum K, PA-C      PDMP not reviewed this encounter.     [1]  Social History Tobacco Use   Smoking status: Some Days    Current packs/day: 0.00    Average packs/day: 0.5 packs/day for 18.2 years (9.1 ttl pk-yrs)    Types: Cigarettes    Start date: 2004    Last attempt to quit: 04/07/2020    Years since quitting: 3.7   Smokeless tobacco: Never   Tobacco comments:    pt smokes some days - quit with pregnancy  Vaping Use   Vaping status: Never Used  Substance Use Topics   Alcohol use: Yes    Comment: Social   Drug use: No     Payton Moder, Rocky  K, PA-C 01/09/24 1025  "

## 2024-01-21 ENCOUNTER — Encounter: Payer: Self-pay | Admitting: Student in an Organized Health Care Education/Training Program

## 2024-01-21 ENCOUNTER — Ambulatory Visit
Attending: Student in an Organized Health Care Education/Training Program | Admitting: Student in an Organized Health Care Education/Training Program

## 2024-01-21 VITALS — BP 117/75 | HR 65 | Temp 97.2°F | Resp 16 | Ht 63.0 in | Wt 136.0 lb

## 2024-01-21 DIAGNOSIS — G894 Chronic pain syndrome: Secondary | ICD-10-CM | POA: Diagnosis not present

## 2024-01-21 DIAGNOSIS — M47812 Spondylosis without myelopathy or radiculopathy, cervical region: Secondary | ICD-10-CM | POA: Insufficient documentation

## 2024-01-21 MED ORDER — CELECOXIB 100 MG PO CAPS: 100.0000 mg | ORAL_CAPSULE | Freq: Two times a day (BID) | ORAL | 1 refills | Status: AC

## 2024-01-21 MED ORDER — GABAPENTIN 300 MG PO CAPS: 300.0000 mg | ORAL_CAPSULE | Freq: Every day | ORAL | 1 refills | Status: AC

## 2024-01-21 NOTE — Progress Notes (Signed)
 PROVIDER NOTE: Interpretation of information contained herein should be left to medically-trained personnel. Specific patient instructions are provided elsewhere under Patient Instructions section of medical record. This document was created in part using AI and STT-dictation technology, any transcriptional errors that may result from this process are unintentional.  Patient: Dominique Webb  Service: E/M Encounter  Provider: Wallie Sherry, MD  DOB: 05-16-1985  Delivery: Face-to-face  Specialty: Interventional Pain Management  MRN: [id number]  Setting: Ambulatory outpatient facility  Specialty designation: 09  Type: New Patient  Location: Outpatient office facility  PCP: Watt Harlene BROCKS, MD  DOS: 01/21/2024    Referring Prov.: Claudene Penne ORN, MD   Primary Reason(s) for Visit: Encounter for initial evaluation of one or more chronic problems (new to examiner) potentially causing chronic pain, and posing a threat to normal musculoskeletal function. (Level of risk: High) CC: Neck Pain  HPI  Dominique Webb is a 39 y.o. year old, female patient, who comes for the first time to our practice referred by Claudene Penne ORN, MD for our initial evaluation of her chronic pain. She has Nexplanon  in place; HSIL (high grade squamous intraepithelial lesion) on Pap smear of cervix; Cervical spondylosis; and Chronic pain syndrome on their problem list. Today she comes in for evaluation of her Neck Pain  Pain Assessment: Location:   Neck Radiating: denies Onset: More than a month ago Duration: Chronic pain Quality: Aching Severity: 5 /10 (subjective, self-reported pain score)  Effect on ADL: difficulty performing daily activities Timing: Intermittent Modifying factors: Lidocaine  patch BP: 117/75  HR: 65  Onset and Duration: Gradual, Date of onset: 05/2023, and Present longer than 3 months Cause of pain: Unknown Severity: No change since onset, NAS-11 at its worse: 10/10, NAS-11 at its best: 3/10, NAS-11 now:  5/10, and NAS-11 on the average: 5/10 Timing: Not influenced by the time of the day Aggravating Factors: Bending, Kneeling, Motion, and Working Alleviating Factors: Cold packs, Hot packs, Resting, and Warm showers or baths Associated Problems: Spasms Quality of Pain: Aching, Pressure-like, Sharp, Throbbing, and Uncomfortable Previous Examinations or Tests: CT scan, MRI scan, and X-rays Previous Treatments: Physical Therapy  Dominique Webb is being evaluated for possible interventional pain management therapies for the treatment of her chronic pain.   Discussed the use of AI scribe software for clinical note transcription with the patient, who gave verbal consent to proceed.  History of Present Illness   Dominique Webb is a 39 year old female who presents with persistent neck pain following a fall.  She has been experiencing persistent neck pain following a fall down 13 steps approximately one year ago, which resulted in her hitting a door and feeling 'frozen' for about ten days. She sought medical attention the day after the fall and was initially treated with lidocaine  patches and other medications.  Records indicate that she was under the influence of alcohol during her fall.  Subsequent evaluations included a CT scan, MRI, and x-rays. Physical therapy provided some relief, although the intensity of her pain has not significantly decreased since the fall.  Her pain averages a 5 out of 10 on most days, escalating to a 9 on bad days, and occasionally dropping to a 0 on good days. 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 No pain radiating into her arms or fingers. She has not received trigger point injections or other interventional treatments for her  neck pain.  Current medications include lidocaine  patches, Flexeril , which she takes at night as needed for nerve pain.   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 No known kidney problems or acid reflux.       Historic Controlled Substance Pharmacotherapy Review  Historical Monitoring: The patient  reports no history of drug use. List of prior UDS Testing: Lab Results  Component Value Date   ETH 50 (H) 05/24/2023   Meds  Current Medications[1]  Imaging Review  Cervical Imaging: Cervical MR wo contrast: Results for orders placed during the hospital encounter of 11/14/23  MR Cervical Spine Wo Contrast  Narrative CLINICAL DATA:  Neck pain after fall  EXAM: MRI CERVICAL SPINE WITHOUT CONTRAST  TECHNIQUE: Multiplanar, multisequence MR imaging of the cervical spine was performed. No intravenous contrast was administered.  COMPARISON:  CT 05/24/2023  FINDINGS: Alignment: Straightening of the cervical lordosis. No significant listhesis.  Vertebrae: No fracture, evidence of discitis, or bone lesion.  Cord: Normal signal and morphology.  Posterior Fossa, vertebral arteries, paraspinal tissues: Negative.  Disc levels:  C2-C3: No significant disc protrusion, foraminal stenosis, or canal stenosis.  C3-C4: Disc osteophyte complex and right-sided uncovertebral spurring. Moderate right foraminal stenosis. Mild canal stenosis.  C4-C5: Disc osteophyte complex and right-sided uncovertebral spurring. Moderate right foraminal stenosis. No significant canal stenosis.  C5-C6: Disc osteophyte complex with bilateral uncovertebral spurring. Moderate canal stenosis with moderate-severe left and moderate right foraminal stenosis.  C6-C7: No significant disc protrusion, foraminal stenosis, or canal stenosis.  C7-T1: No significant disc protrusion, foraminal stenosis, or canal stenosis.  IMPRESSION: 1. Multilevel cervical spondylosis, most pronounced at the C5-6 level where there is moderate canal stenosis with moderate-severe left and moderate right foraminal stenosis. 2. Moderate right foraminal  stenosis at C3-4 and C4-5.   Electronically Signed By: Mabel Converse D.O. On: 11/18/2023 11:54   Narrative CLINICAL DATA:  Provided history: Polytrauma, blunt. Head trauma, moderate/severe.  EXAM: CT HEAD WITHOUT CONTRAST  CT CERVICAL SPINE WITHOUT CONTRAST  TECHNIQUE: Multidetector CT imaging of the head and cervical spine was performed following the standard protocol without intravenous contrast. Multiplanar CT image reconstructions of the cervical spine were also generated.  RADIATION DOSE REDUCTION: This exam was performed according to the departmental dose-optimization program which includes automated exposure control, adjustment of the mA and/or kV according to patient size and/or use of iterative reconstruction technique.  COMPARISON:  None.  FINDINGS: CT HEAD FINDINGS  Brain:  Cerebral volume is normal.  There is no acute intracranial hemorrhage.  No demarcated cortical infarct.  No extra-axial fluid collection.  No evidence of an intracranial mass.  No midline shift.  Vascular: No hyperdense vessel.  Skull: No calvarial fracture. 2.8 cm ill-defined lucent region within the right parietal calvarium.  Sinuses/Orbits: No orbital mass or acute orbital finding. Minimal mucosal thickening within the left frontal, bilateral ethmoid, bilateral sphenoid and left maxillary sinuses at the imaged levels.  CT CERVICAL SPINE FINDINGS  Alignment: Dextrocurvature of the cervical spine. Nonspecific reversal expected cervical lordosis. No significant spondylolisthesis.  Skull base and vertebrae: The basion-dental and atlanto-dental intervals are maintained.No evidence of acute fracture to the cervical spine.  Soft tissues and spinal canal: No prevertebral fluid or swelling. No visible canal hematoma.  Disc levels:  Cervical spondylosis. Mild-to-moderate disc space narrowing at C5-C6. Multilevel disc bulges/central disc protrusions and uncovertebral  hypertrophy. No appreciable high-grade spinal canal stenosis. Multilevel bony neural foraminal narrowing. Small C5-C6 ventral osteophyte.  Upper chest: No consolidation within  the imaged lung apices. No visible pneumothorax.  IMPRESSION: CT head:  1.  No evidence of an acute intracranial abnormality. 2. 2.8 cm ill-defined lucent region within the right parietal calvarium. Although nonspecific, in the absence of any known malignancy this likely reflects a benign lesion (such as a hemangioma). 3. Mild paranasal sinus mucosal thickening at the imaged levels.  CT cervical spine:  1. No evidence of an acute cervical spine fracture. 2. Nonspecific reversal of the expected cervical lordosis. 3. Dextrocurvature of the cervical spine. 4. Cervical spondylosis as described.   Electronically Signed By: Rockey Childs D.O. On: 05/24/2023 12:04    DG Cervical Spine Complete  Narrative EXAM: 6 OR MORE VIEW(S) XRAY OF THE CERVICAL SPINE 12/08/2023 01:58:57 PM  COMPARISON: None available.  CLINICAL HISTORY: Evaluate for excess motion.  FINDINGS:  BONES: Vertebral body heights are maintained. Alignment is normal. No abnormal angulatory or translational motion with flexion and extension positioning to suggest ligamentous instability.  DISCS AND DEGENERATIVE CHANGES: Endplate remodeling at C5-C6 and to a lesser extent C6-C7, is present in keeping with changes of mild degenerative disc disease.  SOFT TISSUES: No prevertebral soft tissue swelling. The visualized lungs appear clear.  IMPRESSION: 1. No abnormal angulatory or translational motion with flexion and extension positioning to suggest ligamentous instability. 2. Chronic endplate remodeling at C5-6 and to a lesser extent C6-7, consistent with mild degenerative disc disease.  Electronically signed by: Dorethia Molt MD 12/15/2023 02:35 AM EST RP Workstation: [id number]  DG Shoulder Right  Narrative CLINICAL DATA:   Pain right shoulder x 1.5 months  EXAM: RIGHT SHOULDER - 2+ VIEW  COMPARISON:  None Available.  FINDINGS: There is no evidence of fracture or dislocation. There is no evidence of arthropathy or other focal bone abnormality. Soft tissues are unremarkable.  IMPRESSION: No radiographic abnormality is seen in right shoulder.   Electronically Signed By: Gearldine Mary M.D. On: 07/26/2021 13:43  DG Hand Complete Right  Narrative Clinical Data:  Pain and swelling over the fourth metacarpal. RIGHT HAND - 3 VIEWS: Comparison: None. There is no evidence of fracture or dislocation. There is no evidence of arthropathy or other focal bone abnormality. Soft tissues are unremarkable.  Impression Negative.  Provider: Beverley Mac  Complexity Note: Imaging results reviewed.                         ROS  Cardiovascular: No reported cardiovascular signs or symptoms such as High blood pressure, coronary artery disease, abnormal heart rate or rhythm, heart attack, blood thinner therapy or heart weakness and/or failure Pulmonary or Respiratory: No reported pulmonary signs or symptoms such as wheezing and difficulty taking a deep full breath (Asthma), difficulty blowing air out (Emphysema), coughing up mucus (Bronchitis), persistent dry cough, or temporary stoppage of breathing during sleep Neurological: No reported neurological signs or symptoms such as seizures, abnormal skin sensations, urinary and/or fecal incontinence, being born with an abnormal open spine and/or a tethered spinal cord Psychological-Psychiatric: No reported psychological or psychiatric signs or symptoms such as difficulty sleeping, anxiety, depression, delusions or hallucinations (schizophrenial), mood swings (bipolar disorders) or suicidal ideations or attempts Gastrointestinal: No reported gastrointestinal signs or symptoms such as vomiting or evacuating blood, reflux, heartburn, alternating episodes of diarrhea and  constipation, inflamed or scarred liver, or pancreas or irrregular and/or infrequent bowel movements Genitourinary: No reported renal or genitourinary signs or symptoms such as difficulty voiding or producing urine, peeing blood, non-functioning kidney, kidney stones, difficulty emptying  the bladder, difficulty controlling the flow of urine, or chronic kidney disease Hematological: No reported hematological signs or symptoms such as prolonged bleeding, low or poor functioning platelets, bruising or bleeding easily, hereditary bleeding problems, low energy levels due to low hemoglobin or being anemic Endocrine: No reported endocrine signs or symptoms such as high or low blood sugar, rapid heart rate due to high thyroid levels, obesity or weight gain due to slow thyroid or thyroid disease Rheumatologic: No reported rheumatological signs and symptoms such as fatigue, joint pain, tenderness, swelling, redness, heat, stiffness, decreased range of motion, with or without associated rash Musculoskeletal: Negative for myasthenia gravis, muscular dystrophy, multiple sclerosis or malignant hyperthermia Work History: Working full time  Allergies  Dominique Webb is allergic to aspirin.  Laboratory Chemistry Profile   Renal Lab Results  Component Value Date   BUN 11 05/24/2023   CREATININE 0.90 05/24/2023   GFR 82.25 08/07/2021   GFRAA >60 10/01/2018   GFRNONAA >60 05/24/2023   PROTEINUR NEGATIVE 07/25/2020     Electrolytes Lab Results  Component Value Date   NA 142 05/24/2023   K 4.4 05/24/2023   CL 110 05/24/2023   CALCIUM  8.8 (L) 05/24/2023     Hepatic Lab Results  Component Value Date   AST 34 05/24/2023   ALT 20 05/24/2023   ALBUMIN 4.0 05/24/2023   ALKPHOS 45 05/24/2023   LIPASE 26 10/01/2018     ID Lab Results  Component Value Date   HIV Non Reactive 11/20/2022   SARSCOV2NAA NEGATIVE 03/04/2022   PREGTESTUR POSITIVE (A) 03/01/2020     Bone No results found for: VD25OH,  CI874NY7UNU, CI6874NY7, CI7874NY7, 25OHVITD1, 25OHVITD2, 25OHVITD3, TESTOFREE, TESTOSTERONE   Endocrine Lab Results  Component Value Date   GLUCOSE 101 (H) 05/24/2023   GLUCOSEU NEGATIVE 07/25/2020   HGBA1C 5.3 04/04/2020   TSH 1.02 08/07/2021     Neuropathy Lab Results  Component Value Date   HGBA1C 5.3 04/04/2020   HIV Non Reactive 11/20/2022     CNS No results found for: COLORCSF, APPEARCSF, RBCCOUNTCSF, WBCCSF, POLYSCSF, LYMPHSCSF, EOSCSF, PROTEINCSF, GLUCCSF, JCVIRUS, CSFOLI, IGGCSF, LABACHR, ACETBL   Inflammation (CRP: Acute  ESR: Chronic) Lab Results  Component Value Date   LATICACIDVEN 1.6 05/24/2023     Rheumatology No results found for: RF, ANA, LABURIC, URICUR, LYMEIGGIGMAB, LYMEABIGMQN, HLAB27   Coagulation Lab Results  Component Value Date   INR 1.0 05/24/2023   LABPROT 13.9 05/24/2023   PLT 192 05/24/2023     Cardiovascular Lab Results  Component Value Date   HGB 13.9 05/24/2023   HCT 41.0 05/24/2023     Screening Lab Results  Component Value Date   SARSCOV2NAA NEGATIVE 03/04/2022   HIV Non Reactive 11/20/2022   PREGTESTUR POSITIVE (A) 03/01/2020     Cancer No results found for: CEA, CA125, LABCA2   Allergens No results found for: ALMOND, APPLE, ASPARAGUS, AVOCADO, BANANA, BARLEY, BASIL, BAYLEAF, GREENBEAN, LIMABEAN, WHITEBEAN, BEEFIGE, REDBEET, BLUEBERRY, BROCCOLI, CABBAGE, MELON, CARROT, CASEIN, CASHEWNUT, CAULIFLOWER, CELERY     Note: Lab results reviewed.  PFSH  Drug: Dominique Webb  reports no history of drug use. Alcohol:  reports current alcohol use. Tobacco:  reports that she has been smoking cigarettes. She started smoking about 22 years ago. She has a 9.1 pack-year smoking history. She has never used smokeless tobacco. Medical:  has a past medical history of Medical history non-contributory. Family: family history includes  Diabetes in her mother; Hyperlipidemia in her mother; Hypertension in her father and mother; Stroke in  her father.  Past Surgical History:  Procedure Laterality Date   COLPOSCOPY W/ BIOPSY / CURETTAGE  01/14/2023   LEEP  02/19/2023   WISDOM TOOTH EXTRACTION     Active Ambulatory Problems    Diagnosis Date Noted   Nexplanon  in place 09/19/2016   HSIL (high grade squamous intraepithelial lesion) on Pap smear of cervix 11/25/2022   Cervical spondylosis 01/21/2024   Chronic pain syndrome 01/21/2024   Resolved Ambulatory Problems    Diagnosis Date Noted   Tobacco abuse 09/19/2016   Abnormal uterine bleeding (AUB) 09/19/2016   ASCUS of cervix with negative high risk HPV 09/26/2016   Suspected COVID-19 virus infection 11/06/2018   Encounter for supervision of normal first pregnancy in first trimester 03/01/2020   Encounter for supervision of high risk pregnancy in third trimester, antepartum 04/04/2020   AMA (advanced maternal age) primigravida 35+, third trimester 04/04/2020   Anemia in pregnancy, third trimester 07/02/2020   Syncope 07/25/2020   Intrauterine growth restriction (IUGR) affecting care of mother 08/30/2020   Past Medical History:  Diagnosis Date   Medical history non-contributory    Constitutional Exam  General appearance: Well nourished, well developed, and well hydrated. In no apparent acute distress Vitals:   01/21/24 1249  BP: 117/75  Pulse: 65  Resp: 16  Temp: (!) 97.2 F (36.2 C)  TempSrc: Temporal  SpO2: 100%  Weight: 136 lb (61.7 kg)  Height: 5' 3 (1.6 m)   BMI Assessment: Estimated body mass index is 24.09 kg/m as calculated from the following:   Height as of this encounter: 5' 3 (1.6 m).   Weight as of this encounter: 136 lb (61.7 kg).  BMI interpretation table: BMI level Category Range association with higher incidence of chronic pain  <18 kg/m2 Underweight   18.5-24.9 kg/m2 Ideal body weight   25-29.9 kg/m2 Overweight Increased incidence by  20%  30-34.9 kg/m2 Obese (Class I) Increased incidence by 68%  35-39.9 kg/m2 Severe obesity (Class II) Increased incidence by 136%  >40 kg/m2 Extreme obesity (Class III) Increased incidence by 254%   Patient's current BMI Ideal Body weight  Body mass index is 24.09 kg/m. Ideal body weight: 52.4 kg (115 lb 8.3 oz) Adjusted ideal body weight: 56.1 kg (123 lb 11.4 oz)   BMI Readings from Last 4 Encounters:  01/21/24 24.09 kg/m  12/08/23 24.33 kg/m  09/29/23 23.23 kg/m  09/22/23 22.81 kg/m   Wt Readings from Last 4 Encounters:  01/21/24 136 lb (61.7 kg)  12/08/23 133 lb (60.3 kg)  09/29/23 127 lb (57.6 kg)  09/22/23 124 lb 11.2 oz (56.6 kg)    Psych/Mental status: Alert, oriented x 3 (person, place, & time)       Eyes: PERLA Respiratory: No evidence of acute respiratory distress  Cervical Spine Area Exam  Skin & Axial Inspection: No masses, redness, edema, swelling, or associated skin lesions Alignment: Symmetrical Functional ROM: Pain restricted ROM, bilaterally Stability: No instability detected Muscle Tone/Strength: Functionally intact. No obvious neuro-muscular anomalies detected. Sensory (Neurological): Musculoskeletal pain pattern Palpation: No palpable anomalies             Upper Extremity (UE) Exam    Side: Right upper extremity  Side: Left upper extremity  Skin & Extremity Inspection: Skin color, temperature, and hair growth are WNL. No peripheral edema or cyanosis. No masses, redness, swelling, asymmetry, or associated skin lesions. No contractures.  Skin & Extremity Inspection: Skin color, temperature, and hair growth are WNL. No peripheral edema or cyanosis. No masses, redness,  swelling, asymmetry, or associated skin lesions. No contractures.  Functional ROM: Unrestricted ROM          Functional ROM: Unrestricted ROM          Muscle Tone/Strength: Functionally intact. No obvious neuro-muscular anomalies detected.  Muscle Tone/Strength: Functionally intact. No  obvious neuro-muscular anomalies detected.  Sensory (Neurological): Unimpaired          Sensory (Neurological): Unimpaired          Palpation: No palpable anomalies              Palpation: No palpable anomalies              Provocative Test(s):  Phalen's test: deferred Tinel's test: deferred Apley's scratch test (touch opposite shoulder):  Action 1 (Across chest): deferred Action 2 (Overhead): deferred Action 3 (LB reach): deferred   Provocative Test(s):  Phalen's test: deferred Tinel's test: deferred Apley's scratch test (touch opposite shoulder):  Action 1 (Across chest): deferred Action 2 (Overhead): deferred Action 3 (LB reach): deferred     Assessment  Primary Diagnosis & Pertinent Problem List: The primary encounter diagnosis was Cervical facet joint syndrome. Diagnoses of Cervical spondylosis and Chronic pain syndrome were also pertinent to this visit.  Visit Diagnosis (New problems to examiner): 1. Cervical facet joint syndrome   2. Cervical spondylosis   3. Chronic pain syndrome    Plan of Care (Initial workup plan)  General Recommendations: The pain condition that the patient suffers from is best treated with a multidisciplinary approach that involves an increase in physical activity to prevent de-conditioning and worsening of the pain cycle, as well as psychological counseling (formal and/or informal) to address the co-morbid psychological affects of pain. Treatment will often involve judicious use of non-opioid pain medications and interventional procedures to decrease the pain, allowing the patient to participate in the physical activity that will ultimately produce long-lasting pain reductions. The goal of the multidisciplinary approach is to return the patient to a higher level of overall function and to restore their ability to perform activities of daily living.   Assessment and Plan    Cervical spondylosis with chronic neck pain   Chronic neck pain is due to  cervical spondylosis, confirmed by MRI showing arthritis at C4-C5 and C5-C6. Pain varies from 0 to 9 on a pain scale and worsens with activity, especially at work. Previous treatments, including lidocaine  patches, Flexeril , and physical therapy, provided limited relief. No radicular symptoms are present. Discussed potential for improvement over time and options for accelerating recovery, such as medication trials and injections.  Recommend Celebrex  100 mg twice daily trial, twice daily after meals and gabapentin  for nighttime use as needed for severe pain. Advised against ibuprofen  or Goody's powder; Tylenol  is permissible. Discussed potential cervical facet medial branch nerve blocks under x-ray guidance if medication trial is ineffective. Scheduled follow-up in four weeks to assess response to treatment.        Pharmacotherapy (current): Medications ordered:  Meds ordered this encounter  Medications   celecoxib  (CELEBREX ) 100 MG capsule    Sig: Take 1 capsule (100 mg total) by mouth 2 (two) times daily.    Dispense:  60 capsule    Refill:  1   gabapentin  (NEURONTIN ) 300 MG capsule    Sig: Take 1 capsule (300 mg total) by mouth at bedtime.    Dispense:  30 capsule    Refill:  1   Medications administered during this visit: Dominique Webb had no medications administered during  this visit.     Interventional management options: Dominique Webb was informed that there is no guarantee that she would be a candidate for interventional therapies. The decision will be based on the results of diagnostic studies, as well as Dominique Webb's risk profile.  Procedure(s) under consideration:  Cervical TPI Cervical facet medial branch nerve block  Provider-requested follow-up: Return in about 4 weeks (around 02/18/2024) for 2nd pt visit- discuss C- fcts.  Future Appointments  Date Time Provider Department Center  02/17/2024  1:40 PM Marcelino Nurse, MD [facility] None   I discussed the assessment and  treatment plan with the patient. The patient was provided an opportunity to ask questions and all were answered. The patient agreed with the plan and demonstrated an understanding of the instructions.  Patient advised to call back or seek an in-person evaluation if the symptoms or condition worsens.  I personally spent a total of 60 minutes in the care of the patient today including preparing to see the patient, getting/reviewing separately obtained history, performing a medically appropriate exam/evaluation, counseling and educating, placing orders, and documenting clinical information in the EHR.   Note by: Nurse Marcelino, MD (TTS and AI technology used. I apologize for any typographical errors that were not detected and corrected.) Date: 01/21/2024; Time: 3:05 PM     [1]  Current Outpatient Medications:    benzonatate  (TESSALON ) 100 MG capsule, Take 1 capsule (100 mg total) by mouth every 8 (eight) hours., Disp: 21 capsule, Rfl: 0   celecoxib  (CELEBREX ) 100 MG capsule, Take 1 capsule (100 mg total) by mouth 2 (two) times daily., Disp: 60 capsule, Rfl: 1   Etonogestrel  (NEXPLANON  [place]), Inject into the skin. Placed 2022, Disp: , Rfl:    gabapentin  (NEURONTIN ) 300 MG capsule, Take 1 capsule (300 mg total) by mouth at bedtime., Disp: 30 capsule, Rfl: 1   Multiple Vitamins-Minerals (HAIR SKIN AND NAILS FORMULA PO), Take by mouth daily., Disp: , Rfl:

## 2024-02-17 ENCOUNTER — Ambulatory Visit: Admitting: Student in an Organized Health Care Education/Training Program
